# Patient Record
Sex: Female | Born: 1950 | Race: White | Hispanic: No | Smoking: Current every day smoker
Health system: Southern US, Community
[De-identification: ages and names within clinical notes are randomized; demographics above are authoritative.]

## PROBLEM LIST (undated history)

## (undated) DIAGNOSIS — I10 Essential (primary) hypertension: Secondary | ICD-10-CM

## (undated) DIAGNOSIS — M797 Fibromyalgia: Secondary | ICD-10-CM

## (undated) DIAGNOSIS — K219 Gastro-esophageal reflux disease without esophagitis: Secondary | ICD-10-CM

## (undated) DIAGNOSIS — F32A Depression, unspecified: Secondary | ICD-10-CM

## (undated) DIAGNOSIS — I2699 Other pulmonary embolism without acute cor pulmonale: Secondary | ICD-10-CM

## (undated) DIAGNOSIS — R51 Headache: Secondary | ICD-10-CM

## (undated) DIAGNOSIS — I2510 Atherosclerotic heart disease of native coronary artery without angina pectoris: Secondary | ICD-10-CM

## (undated) DIAGNOSIS — G8929 Other chronic pain: Secondary | ICD-10-CM

## (undated) DIAGNOSIS — R519 Headache, unspecified: Secondary | ICD-10-CM

## (undated) DIAGNOSIS — E785 Hyperlipidemia, unspecified: Secondary | ICD-10-CM

## (undated) DIAGNOSIS — K635 Polyp of colon: Secondary | ICD-10-CM

## (undated) DIAGNOSIS — J189 Pneumonia, unspecified organism: Secondary | ICD-10-CM

## (undated) DIAGNOSIS — B1920 Unspecified viral hepatitis C without hepatic coma: Secondary | ICD-10-CM

## (undated) DIAGNOSIS — F419 Anxiety disorder, unspecified: Secondary | ICD-10-CM

## (undated) DIAGNOSIS — J849 Interstitial pulmonary disease, unspecified: Secondary | ICD-10-CM

## (undated) DIAGNOSIS — F329 Major depressive disorder, single episode, unspecified: Secondary | ICD-10-CM

## (undated) DIAGNOSIS — Z853 Personal history of malignant neoplasm of breast: Secondary | ICD-10-CM

## (undated) DIAGNOSIS — K589 Irritable bowel syndrome without diarrhea: Secondary | ICD-10-CM

## (undated) DIAGNOSIS — C539 Malignant neoplasm of cervix uteri, unspecified: Secondary | ICD-10-CM

## (undated) DIAGNOSIS — R079 Chest pain, unspecified: Secondary | ICD-10-CM

## (undated) DIAGNOSIS — G459 Transient cerebral ischemic attack, unspecified: Secondary | ICD-10-CM

## (undated) DIAGNOSIS — M1990 Unspecified osteoarthritis, unspecified site: Secondary | ICD-10-CM

## (undated) DIAGNOSIS — Z9289 Personal history of other medical treatment: Secondary | ICD-10-CM

## (undated) DIAGNOSIS — J449 Chronic obstructive pulmonary disease, unspecified: Secondary | ICD-10-CM

## (undated) HISTORY — DX: Polyp of colon: K63.5

## (undated) HISTORY — DX: Malignant neoplasm of cervix uteri, unspecified: C53.9

## (undated) HISTORY — DX: Depression, unspecified: F32.A

## (undated) HISTORY — DX: Hyperlipidemia, unspecified: E78.5

## (undated) HISTORY — DX: Unspecified osteoarthritis, unspecified site: M19.90

## (undated) HISTORY — DX: Other pulmonary embolism without acute cor pulmonale: I26.99

## (undated) HISTORY — DX: Unspecified viral hepatitis C without hepatic coma: B19.20

## (undated) HISTORY — PX: PARTIAL HYSTERECTOMY: SHX80

## (undated) HISTORY — PX: BRAIN SURGERY: SHX531

## (undated) HISTORY — DX: Anxiety disorder, unspecified: F41.9

## (undated) HISTORY — PX: TOTAL ABDOMINAL HYSTERECTOMY: SHX209

## (undated) HISTORY — DX: Transient cerebral ischemic attack, unspecified: G45.9

## (undated) HISTORY — DX: Irritable bowel syndrome, unspecified: K58.9

## (undated) HISTORY — PX: BREAST SURGERY: SHX581

## (undated) HISTORY — PX: THROAT SURGERY: SHX803

## (undated) HISTORY — DX: Fibromyalgia: M79.7

## (undated) HISTORY — DX: Chronic obstructive pulmonary disease, unspecified: J44.9

## (undated) HISTORY — DX: Major depressive disorder, single episode, unspecified: F32.9

## (undated) HISTORY — DX: Pneumonia, unspecified organism: J18.9

## (undated) HISTORY — PX: CATARACT EXTRACTION W/ INTRAOCULAR LENS IMPLANT: SHX1309

---

## 2000-09-12 ENCOUNTER — Encounter: Admission: RE | Admit: 2000-09-12 | Discharge: 2000-09-12 | Payer: Self-pay | Admitting: Urology

## 2001-01-12 ENCOUNTER — Ambulatory Visit (HOSPITAL_COMMUNITY): Admission: RE | Admit: 2001-01-12 | Discharge: 2001-01-12 | Payer: Self-pay | Admitting: Family Medicine

## 2001-01-12 ENCOUNTER — Encounter: Payer: Self-pay | Admitting: Family Medicine

## 2001-08-23 DIAGNOSIS — Z9289 Personal history of other medical treatment: Secondary | ICD-10-CM

## 2001-08-23 HISTORY — DX: Personal history of other medical treatment: Z92.89

## 2001-11-08 ENCOUNTER — Encounter: Payer: Self-pay | Admitting: Family Medicine

## 2001-11-08 ENCOUNTER — Observation Stay (HOSPITAL_COMMUNITY): Admission: AD | Admit: 2001-11-08 | Discharge: 2001-11-10 | Payer: Self-pay | Admitting: Family Medicine

## 2001-11-10 ENCOUNTER — Encounter: Payer: Self-pay | Admitting: Family Medicine

## 2001-11-10 ENCOUNTER — Encounter: Payer: Self-pay | Admitting: Cardiology

## 2001-11-19 ENCOUNTER — Encounter: Payer: Self-pay | Admitting: Emergency Medicine

## 2001-11-19 ENCOUNTER — Emergency Department (HOSPITAL_COMMUNITY): Admission: EM | Admit: 2001-11-19 | Discharge: 2001-11-19 | Payer: Self-pay | Admitting: Emergency Medicine

## 2003-05-29 IMAGING — CT CT CHEST W/ CM
1 of 2 series · 16 of 31 positions shown, 20 images · IV contrast (CONTRAST)
Comparison: none

FINDINGS
CLINICAL DATA: SMOKER WITH CHEST PAIN AND SHORTNESS OF BREATH.
CT CHEST WITH CONTRAST MEDIA
COMPARISON 10/15/00.  A PULMONARY EMBOLISM PROTOCOL WAS REQUESTED AND PERFORMED WITH 150 CC
OMNIPAQUE 300 INTRAVENOUS CONTRAST.  THE PULMONARY ARTERIES ARE NORMALLY OPACIFIED.  NO PULMONARY
EMBOLI ARE SEEN.  MILD DIFFUSE ACCENTUATION OF THE INTERSTITIAL MARKINGS IS AGAIN DEMONSTRATED.  NO
AIR SPACE CONSOLIDATION IS SEEN.  THE REGIONAL BONES ARE UNREMARKABLE.  A 1.6 CM MIXED LOW AND
INTERMEDIATE DENSITY LEFT THYROID MASS IS UNCHANGED.
IMPRESSION
1.  NO PULMONARY EMBOLI.
2.  STABLE MILD CHRONIC INTERSTITIAL LUNG DISEASE COMPATIBLE WITH A HISTORY OF SMOKING.
3.  STABLE 1.6 CM LEFT LOBE THYROID MASS.

[Series 604: — · axial · 0.57mm/px · z∈[+1646,+1833]mm · 16 of 343 slices shown, 20 images]
[im 16/343  mediastinal]
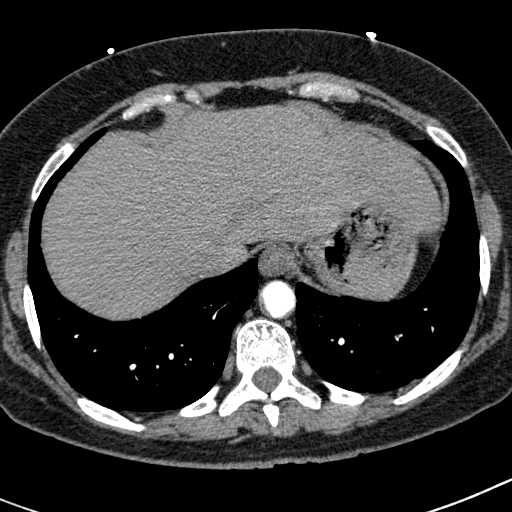
[im 16/343  lung]
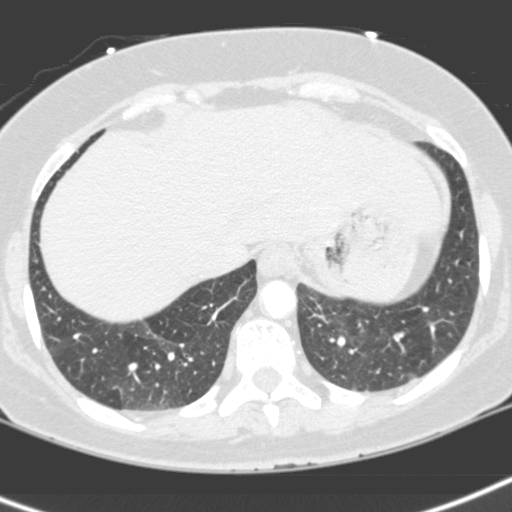
[im 47/343  lung]
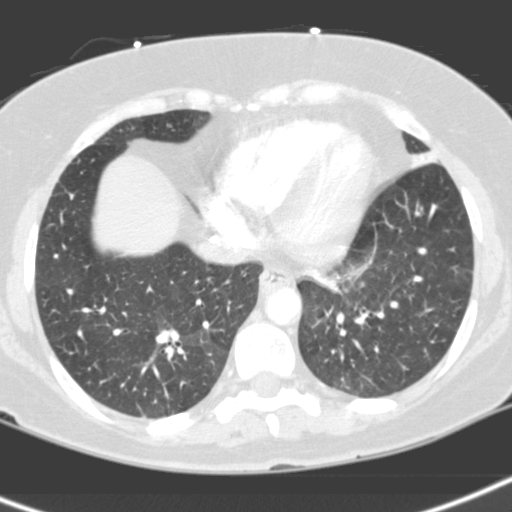
[im 63/343  lung]
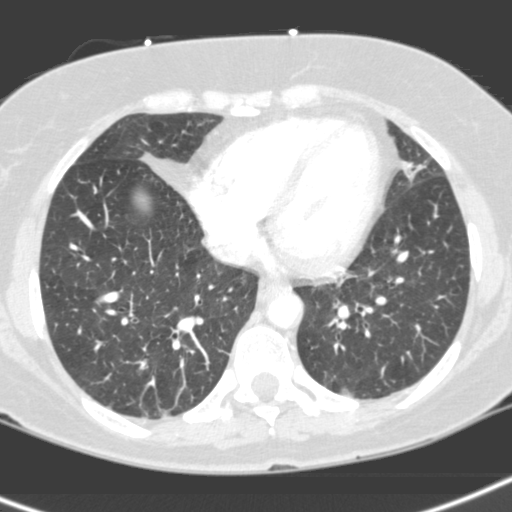
[im 86/343  lung]
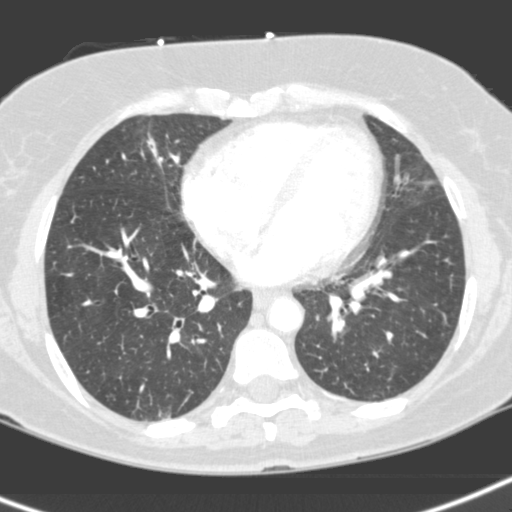
[im 94/343  mediastinal]
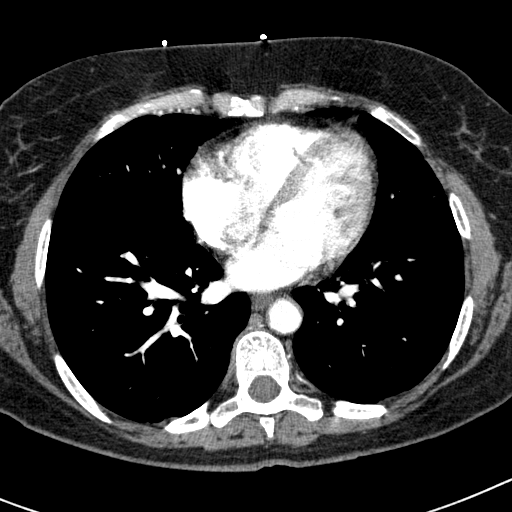
[im 94/343  lung]
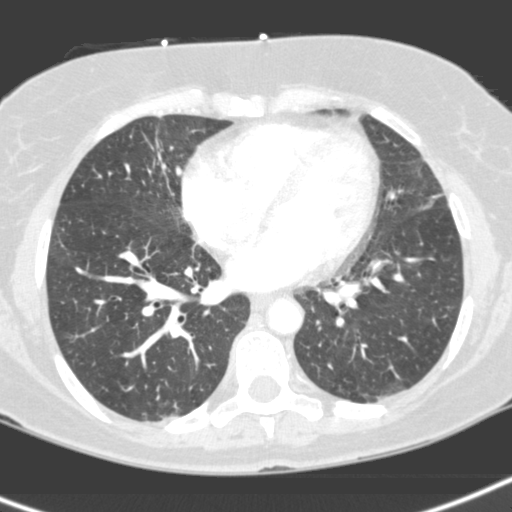
[im 109/343  lung]
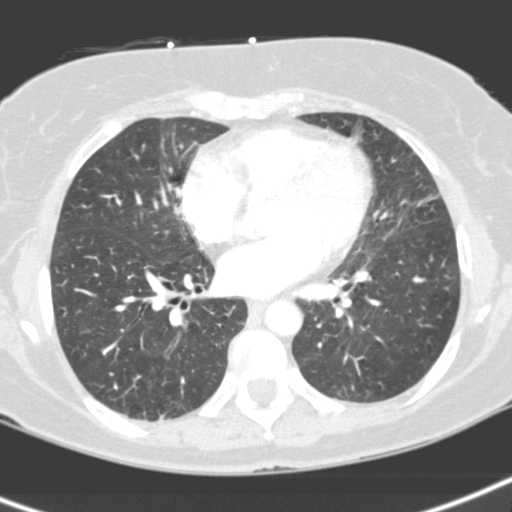
[im 140/343  lung]
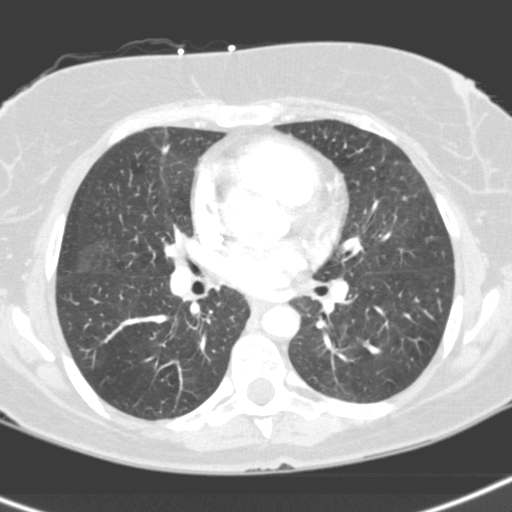
[im 156/343  lung]
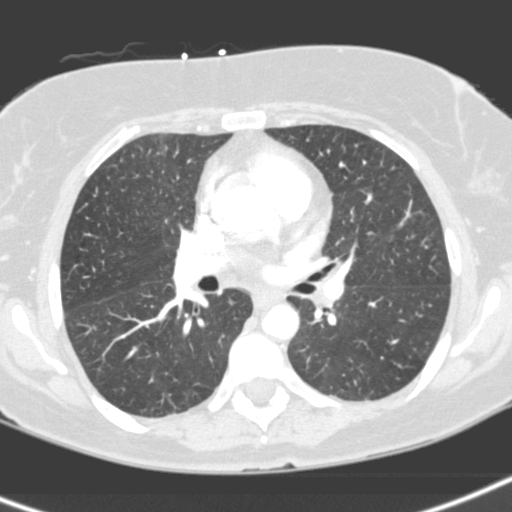
[im 187/343  mediastinal]
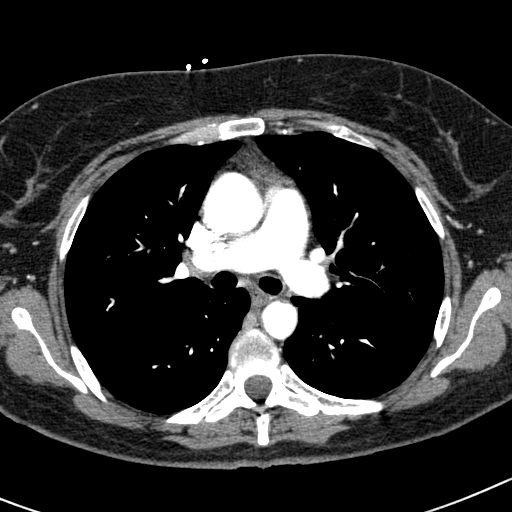
[im 187/343  lung]
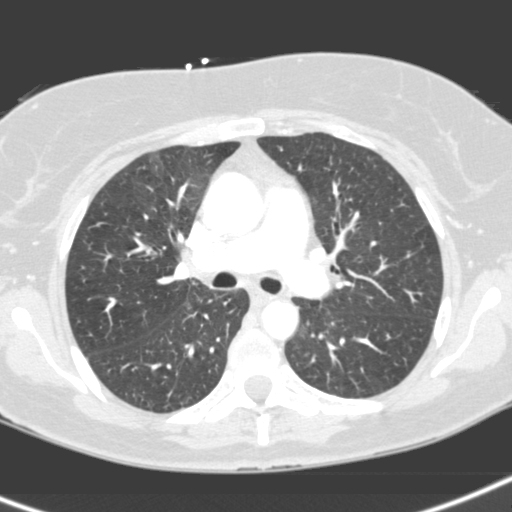
[im 203/343  lung]
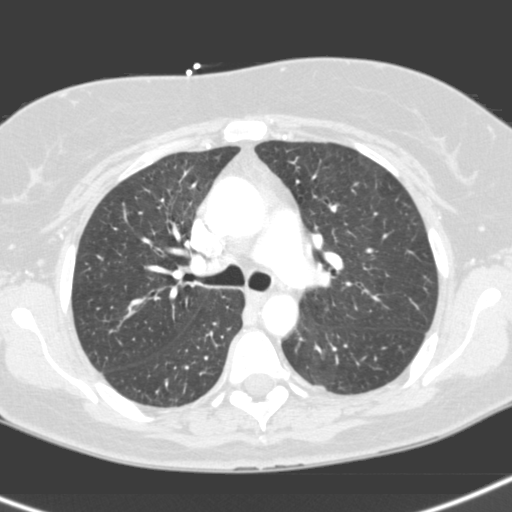
[im 234/343  lung]
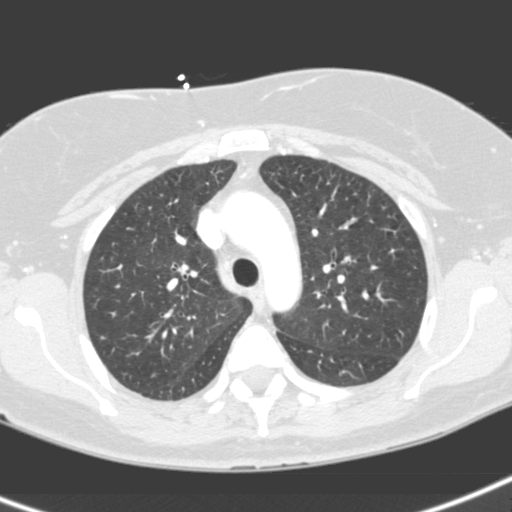
[im 249/343  lung]
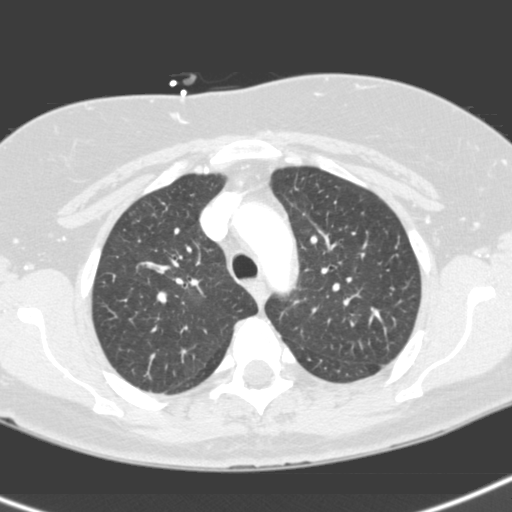
[im 257/343  mediastinal]
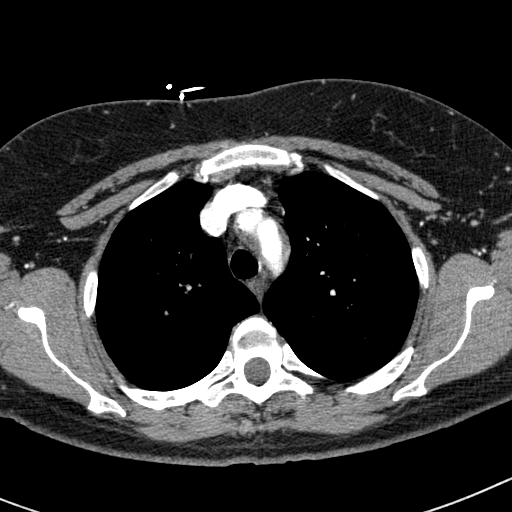
[im 257/343  lung]
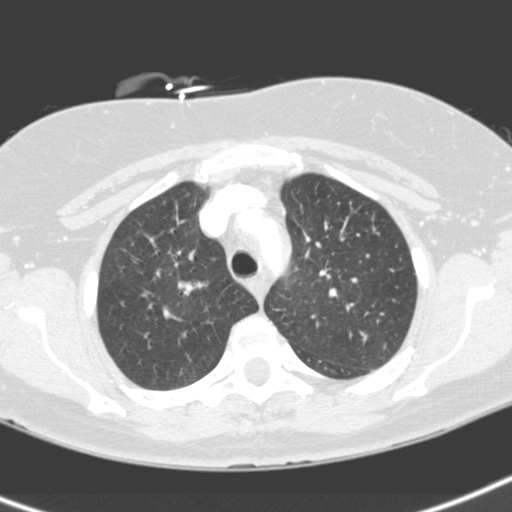
[im 280/343  lung]
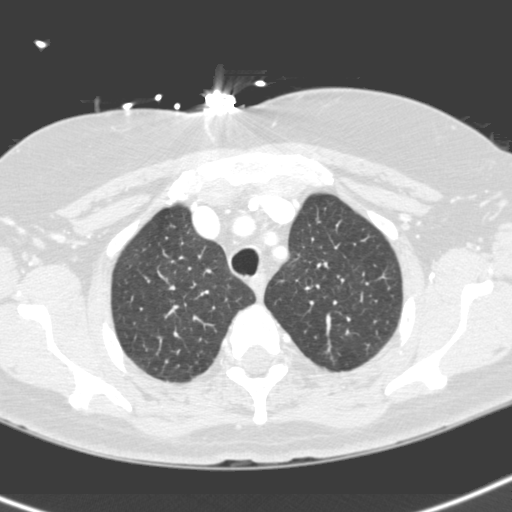
[im 296/343  lung]
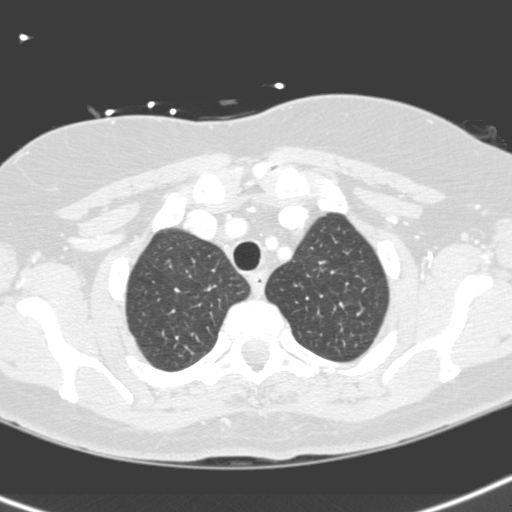
[im 327/343  lung]
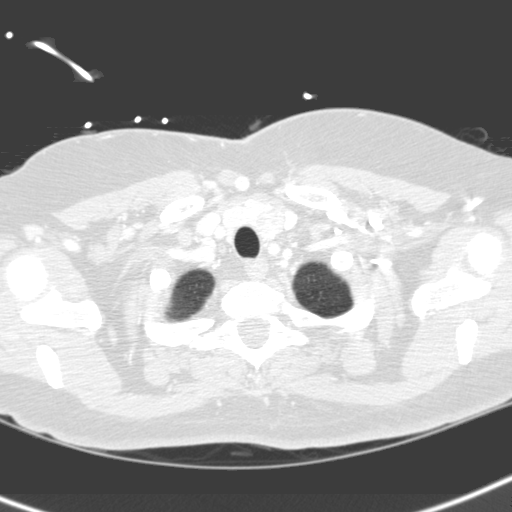

[16 of 31 positions shown; findings below may reference images not displayed]

## 2006-04-18 ENCOUNTER — Ambulatory Visit (HOSPITAL_COMMUNITY): Admission: RE | Admit: 2006-04-18 | Discharge: 2006-04-18 | Payer: Self-pay | Admitting: Family Medicine

## 2006-04-21 ENCOUNTER — Emergency Department (HOSPITAL_COMMUNITY): Admission: EM | Admit: 2006-04-21 | Discharge: 2006-04-22 | Payer: Self-pay | Admitting: Emergency Medicine

## 2008-05-13 ENCOUNTER — Ambulatory Visit (HOSPITAL_COMMUNITY): Admission: RE | Admit: 2008-05-13 | Discharge: 2008-05-13 | Payer: Self-pay | Admitting: Family Medicine

## 2011-01-08 NOTE — Discharge Summary (Signed)
[facility]  Patient:    Dawn Mckay, Dawn Mckay Visit Number: [id number] MRN: [id number]          Service Type: MED Location: 2A A206 01 Attending Physician:  Harlow Asa Dictated by:   Lilyan Punt, M.D. Admit Date:  11/08/2001 Discharge Date: 11/10/2001                             Discharge Summary  DISCHARGE DIAGNOSES: 1. Chest discomfort, presumed gastroesophageal reflux disease. 2. Myocardial infarction ruled out. 3. Pulmonary embolus ruled out.  HOSPITAL COURSE:  The patient is a 60 year old white female with a history of pulmonary embolus in 1992, presents with severe chest discomfort and pain and difficulty with breathing and pressure, slight nausea.  Underwent exercise Cardiolite test, and that did not show any ischemia.  Her echo showed a normal left and right atrial size, no valvular abnormalities.  Her spiral CT of the chest did not show any pulmonary embolus.  The patient was discharged to home.  MEDICATIONS: 1. Xanax 0.5 mg b.i.d. p.r.n. nerves.  Cautioned drowsiness. 2. Zantac 150 mg, 1 b.i.d.  FOLLOW-UP:  To follow up in the office for a complete physical somewhere in the course of the next month.  DISCHARGE INSTRUCTIONS:  To stay out of work until next Wednesday.  To call sooner if any problems. Dictated by:   Lilyan Punt, M.D. Attending Physician:  Harlow Asa DD:  11/10/01 TD:  11/12/01 Job: [id number]

## 2011-01-08 NOTE — Procedures (Signed)
[facility]  Patient:    DANA, DEBO Visit Number: [id number] MRN: [id number]          Service Type: MED Location: 2A A206 01 Attending Physician:  Harlow Asa Dictated by:   Vania Rea. Rinehuls, P.A. Proc. Date: 11/10/01 Admit Date:  11/08/2001 Discharge Date: 11/10/2001   CC:         Lilyan Punt, M.D.   Stress Test  DATE OF BIRTH:  1951/06/14.  PROCEDURE:  Exercise Cardiolite study.  CARDIOLOGIST:  Valera Castle, M.D.  INDICATIONS:  Ms. Olenick is a 60 year old female who was admitted to [facility] on November 08, 2001, for an evaluation of chest pain.  She has no previous cardiac history.  She does have some risk factors, including ongoing tobacco use, a history of recent syncope, a history of a pulmonary embolus in 1996.  She had a 2-D echocardiogram that is currently pending.  The patient was initially scheduled for an exercise Cardiolite.  DESCRIPTION OF PROCEDURE:  She was only able to exercise for 1 minute and 32 seconds.  She felt very weak and fatigued.  She was unable to go further.  She developed some mild left arm pain, but no significant chest pain.  On physical examination the patient had no significant wheezing at this time. She was changed to an adenosine Cardiolite study.  The adenosine was administered minutes 1 through 6.  Cardiolite was given at 3 minutes.  The patient felt "bad" during this study; however, she was unable to give any specific complaints.  There were no electrocardiographic changes during this study.  Her symptoms are resolving at this time.  The images are pending. Dictated by:   Vania Rea. Rinehuls, P.A. Attending Physician:  Harlow Asa DD:  11/10/01 TD:  11/12/01 Job: [id number]

## 2011-01-08 NOTE — Procedures (Signed)
[facility]  Patient:    Dawn Mckay, Dawn Mckay Visit Number: [id number] MRN: [id number]          Service Type: EMS Location: ED Attending Physician:  Herbert Seta Dictated by:   Lilyan Punt, M.D. Admit Date:  11/19/2001 Discharge Date: 11/19/2001                            EKG Interpretations  319  INTERPRETATION:  Normal sinus rhythm, no acute changes are noted.  ST segemtns are normal.  DIAGNOSIS:  Normal EKG. Dictated by:   Lilyan Punt, M.D. Attending Physician:  Herbert Seta DD:  11/24/01 TD:  11/25/01 Job: [id number]

## 2011-01-08 NOTE — Group Therapy Note (Signed)
[facility]  Patient:    Dawn Mckay, Dawn Mckay Visit Number: [id number] MRN: [id number]          Service Type: MED Location: 2A A206 01 Attending Physician:  Harlow Asa Dictated by:   Lilyan Punt, M.D. Proc. Date: 11/10/01 Admit Date:  11/08/2001 Discharge Date: 11/10/2001                               Progress Note  SUBJECTIVE:  The patient relates minimal discomforts, but no anginal discomfort.  Awaiting the stress portion today.  Overall doing well.  OBJECTIVE:  The lungs are clear.  Heart:  Regular pulse.  Blood pressure good.  ASSESSMENT/PLAN:  Chest pain, awaiting the stress portion with cardiologists interpretation. Dictated by:   Lilyan Punt, M.D. Attending Physician:  Harlow Asa DD:  11/10/01 TD:  11/12/01 Job: [id number]

## 2011-01-08 NOTE — Procedures (Signed)
[facility]  Patient:    Dawn Mckay, Dawn Mckay Visit Number: [id number] MRN: [id number]          Service Type: EMS Location: ED Attending Physician:  Herbert Seta Dictated by:   Lilyan Punt, M.D. Admit Date:  11/19/2001 Discharge Date: 11/19/2001                            EKG Interpretations  [id number]  INTERPRETATION:  Normal sinus rhythm, loss of Q wave in V3 otherwise normal. Dictated by:   Lilyan Punt, M.D. Attending Physician:  Herbert Seta DD:  11/24/01 TD:  11/25/01 Job: [id number]

## 2011-01-08 NOTE — H&P (Signed)
[facility]  Patient:    Dawn Mckay, Dawn Mckay Visit Number: [id number] MRN: [id number]          Service Type: EMS Location: ED Attending Physician:  Herbert Seta Dictated by:   Donna Bernard, M.D. Admit Date:  11/19/2001 Discharge Date: 11/19/2001                           History and Physical  CHIEF COMPLAINT:  Chest pain.  HISTORY OF PRESENT ILLNESS:  This patient is a 60 year old white female with a history of remote pulmonary embolus and chronic smoking, who presented to the office on the day of admission with complaints of chest pain.  The patient states that she has been experiencing chest discomfort, usually while she is at work.  She describes the chest pain as a deep substernal pressure which radiates to the back.  It is accompanied at times by shortness of breath. There is no significant nausea, though at times the patient does experience diaphoresis.  She has been experiencing these symptoms off and on for some time but worse in the past week.  The patient notes no significant abdominal pain, although she does have reflux-type symptoms.  Also, on further history the pain is minimally associated with change in position or use of arms.  The patient has tried over-the-counter medicines p.r.n.  She states when the pain comes on she rests for a few minutes, then it fades.  Recently she has been taking aspirin on a regular basis in hopes of managing any type of cardiac pain.  CURRENT MEDICATIONS: 1. Aspirin p.r.n. 2. Xanax 0.5 mg b.i.d. p.r.n.  PAST MEDICAL HISTORY: 1. Depression. 2. Remote pulmonary embolus in 1992.  PAST SURGICAL HISTORY: 1. Hysterectomy in 1982. 2. Bladder operation in 2002.  FAMILY HISTORY:  Positive for hypertension, diabetes, and coronary artery disease in a maternal grandmother.  SOCIAL HISTORY:  The patient smokes one-half pack per day.  She claims no significant alcohol use.  PHYSICAL  EXAMINATION:  VITAL SIGNS:  Blood pressure 150/90.  GENERAL:  Alert, in some apparent distress.  HEENT:  Normal.  NECK:  Supple.  No lymphadenopathy, no JVD.  LUNGS:  BILATERAL mild wheezes.  No crackles.  CHEST:  Anterior chest wall tender to palpation along the sternum.  Epigastric region, some tenderness evident to deep palpation.  No rebound, no guarding, no masses.  EXTREMITIES:  Normal.  LABORATORY DATA:  EKG:  Normal sinus rhythm with no significant ST-T changes.  Laboratories pending.  IMPRESSION: 1. Chest pain with some features suggestive of unstable angina.  With multiple    risk factors and nature of the pain, we need to assume the possibility of    this and treat accordingly.  Exam also supported an element of chest wall    inflammation along with an element of gastritis.  The gastritis may well be    induced by significant recent aspirin usage in response to chest    discomfort. 2. Chronic smoker. 3. Depression.  PLAN:  Admit with unstable angina diagnosis.  Further orders as noted in the chart. Dictated by:   Donna Bernard, M.D. Attending Physician:  Herbert Seta DD:  11/08/01 TD:  11/09/01 Job: [id number]

## 2013-02-21 ENCOUNTER — Other Ambulatory Visit (HOSPITAL_COMMUNITY): Payer: Self-pay | Admitting: Nurse Practitioner

## 2013-02-21 ENCOUNTER — Other Ambulatory Visit (HOSPITAL_COMMUNITY): Payer: Self-pay | Admitting: *Deleted

## 2013-02-21 DIAGNOSIS — IMO0002 Reserved for concepts with insufficient information to code with codable children: Secondary | ICD-10-CM

## 2013-02-28 ENCOUNTER — Encounter (HOSPITAL_COMMUNITY): Payer: Self-pay

## 2013-03-07 ENCOUNTER — Ambulatory Visit (HOSPITAL_COMMUNITY)
Admission: RE | Admit: 2013-03-07 | Discharge: 2013-03-07 | Disposition: A | Discharge status: Home / Self Care | Payer: PRIVATE HEALTH INSURANCE | Source: Ambulatory Visit | Attending: *Deleted | Admitting: *Deleted

## 2013-03-07 DIAGNOSIS — IMO0002 Reserved for concepts with insufficient information to code with codable children: Secondary | ICD-10-CM

## 2013-03-07 DIAGNOSIS — N644 Mastodynia: Secondary | ICD-10-CM | POA: Insufficient documentation

## 2013-08-04 ENCOUNTER — Observation Stay (HOSPITAL_COMMUNITY): Payer: Self-pay

## 2013-08-04 ENCOUNTER — Emergency Department (HOSPITAL_COMMUNITY): Payer: Self-pay

## 2013-08-04 ENCOUNTER — Encounter (HOSPITAL_COMMUNITY): Payer: Self-pay | Admitting: Emergency Medicine

## 2013-08-04 ENCOUNTER — Other Ambulatory Visit: Payer: Self-pay

## 2013-08-04 ENCOUNTER — Observation Stay (HOSPITAL_COMMUNITY)
Admission: EM | Admit: 2013-08-04 | Discharge: 2013-08-04 | Disposition: A | Discharge status: Home / Self Care | Payer: Self-pay | Attending: Internal Medicine | Admitting: Internal Medicine

## 2013-08-04 DIAGNOSIS — I10 Essential (primary) hypertension: Secondary | ICD-10-CM | POA: Insufficient documentation

## 2013-08-04 DIAGNOSIS — I2510 Atherosclerotic heart disease of native coronary artery without angina pectoris: Secondary | ICD-10-CM

## 2013-08-04 DIAGNOSIS — R110 Nausea: Secondary | ICD-10-CM | POA: Insufficient documentation

## 2013-08-04 DIAGNOSIS — F17200 Nicotine dependence, unspecified, uncomplicated: Secondary | ICD-10-CM

## 2013-08-04 DIAGNOSIS — N644 Mastodynia: Secondary | ICD-10-CM | POA: Diagnosis present

## 2013-08-04 DIAGNOSIS — E871 Hypo-osmolality and hyponatremia: Secondary | ICD-10-CM

## 2013-08-04 DIAGNOSIS — R079 Chest pain, unspecified: Secondary | ICD-10-CM

## 2013-08-04 DIAGNOSIS — Z853 Personal history of malignant neoplasm of breast: Secondary | ICD-10-CM | POA: Insufficient documentation

## 2013-08-04 DIAGNOSIS — E876 Hypokalemia: Secondary | ICD-10-CM

## 2013-08-04 DIAGNOSIS — Z86718 Personal history of other venous thrombosis and embolism: Secondary | ICD-10-CM

## 2013-08-04 DIAGNOSIS — R072 Precordial pain: Principal | ICD-10-CM | POA: Insufficient documentation

## 2013-08-04 HISTORY — DX: Atherosclerotic heart disease of native coronary artery without angina pectoris: I25.10

## 2013-08-04 HISTORY — DX: Personal history of malignant neoplasm of breast: Z85.3

## 2013-08-04 HISTORY — DX: Essential (primary) hypertension: I10

## 2013-08-04 LAB — COMPREHENSIVE METABOLIC PANEL
ALT: 18 U/L (ref 0–35)
AST: 26 U/L (ref 0–37)
Albumin: 3.8 g/dL (ref 3.5–5.2)
Alkaline Phosphatase: 64 U/L (ref 39–117)
Calcium: 9 mg/dL (ref 8.4–10.5)
Potassium: 3.2 mEq/L — ABNORMAL LOW (ref 3.5–5.1)
Sodium: 130 mEq/L — ABNORMAL LOW (ref 135–145)
Total Protein: 6.7 g/dL (ref 6.0–8.3)

## 2013-08-04 LAB — BASIC METABOLIC PANEL
BUN: 4 mg/dL — ABNORMAL LOW (ref 6–23)
CO2: 22 mEq/L (ref 19–32)
Calcium: 9.1 mg/dL (ref 8.4–10.5)
Chloride: 97 mEq/L (ref 96–112)
Creatinine, Ser: 0.66 mg/dL (ref 0.50–1.10)
GFR calc Af Amer: >90 mL/min (ref >90)
Potassium: 3.5 mEq/L (ref 3.5–5.1)

## 2013-08-04 LAB — TROPONIN I
Troponin I: <0.3 ng/mL (ref <0.30)
Troponin I: <0.3 ng/mL (ref <0.30)

## 2013-08-04 LAB — CBC
HCT: 38.6 % (ref 36.0–46.0)
Hemoglobin: 13.5 g/dL (ref 12.0–15.0)
MCH: 31.7 pg (ref 26.0–34.0)
MCH: 30.8 pg (ref 26.0–34.0)
MCHC: 36.3 g/dL — ABNORMAL HIGH (ref 30.0–36.0)
MCHC: 35 g/dL (ref 30.0–36.0)
MCV: 88.1 fL (ref 78.0–100.0)
Platelets: 170 10*3/uL (ref 150–400)
RDW: 13.6 % (ref 11.5–15.5)

## 2013-08-04 LAB — POCT I-STAT TROPONIN I

## 2013-08-04 MED ORDER — REGADENOSON 0.4 MG/5ML IV SOLN
INTRAVENOUS | Status: AC
  Administered 2013-08-04: 0.4 mg via INTRAVENOUS
  Filled 2013-08-04: qty 5

## 2013-08-04 MED ORDER — SODIUM CHLORIDE 0.9 % IV SOLN
INTRAVENOUS | Status: DC
  Administered 2013-08-04: 09:00:00 via INTRAVENOUS

## 2013-08-04 MED ORDER — TECHNETIUM TC 99M SESTAMIBI - CARDIOLITE
30.0000 | Freq: Once | INTRAVENOUS | Status: AC | PRN
  Administered 2013-08-04: 11:00:00 30 via INTRAVENOUS

## 2013-08-04 MED ORDER — NICOTINE 7 MG/24HR TD PT24
7.0000 mg | MEDICATED_PATCH | TRANSDERMAL | Status: DC
  Filled 2013-08-04 (×2): qty 1

## 2013-08-04 MED ORDER — ALUM & MAG HYDROXIDE-SIMETH 200-200-20 MG/5ML PO SUSP: 30.0000 mL | Freq: Four times a day (QID) | ORAL | Status: DC | PRN

## 2013-08-04 MED ORDER — TECHNETIUM TC 99M SESTAMIBI GENERIC - CARDIOLITE
10.0000 | Freq: Once | INTRAVENOUS | Status: AC | PRN
  Administered 2013-08-04: 10 via INTRAVENOUS

## 2013-08-04 MED ORDER — REGADENOSON 0.4 MG/5ML IV SOLN
0.4000 mg | Freq: Once | INTRAVENOUS | Status: AC
  Administered 2013-08-04: 0.4 mg via INTRAVENOUS
  Filled 2013-08-04: qty 5

## 2013-08-04 MED ORDER — HYDROMORPHONE HCL PF 1 MG/ML IJ SOLN
0.5000 mg | INTRAMUSCULAR | Status: DC | PRN
  Administered 2013-08-04: 0.5 mg via INTRAVENOUS
  Filled 2013-08-04: qty 1

## 2013-08-04 MED ORDER — ZOLPIDEM TARTRATE 5 MG PO TABS: 5.0000 mg | ORAL_TABLET | Freq: Every evening | ORAL | Status: DC | PRN

## 2013-08-04 MED ORDER — POTASSIUM CHLORIDE CRYS ER 20 MEQ PO TBCR
40.0000 meq | EXTENDED_RELEASE_TABLET | Freq: Once | ORAL | Status: AC
  Administered 2013-08-04: 40 meq via ORAL
  Filled 2013-08-04: qty 2

## 2013-08-04 MED ORDER — SODIUM CHLORIDE 0.9 % IV SOLN: INTRAVENOUS | Status: DC

## 2013-08-04 MED ORDER — HYDRALAZINE HCL 20 MG/ML IJ SOLN: 10.0000 mg | Freq: Four times a day (QID) | INTRAMUSCULAR | Status: DC | PRN

## 2013-08-04 MED ORDER — IOHEXOL 350 MG/ML SOLN
80.0000 mL | Freq: Once | INTRAVENOUS | Status: AC | PRN
  Administered 2013-08-04: 80 mL via INTRAVENOUS

## 2013-08-04 MED ORDER — ACETAMINOPHEN 650 MG RE SUPP: 650.0000 mg | Freq: Four times a day (QID) | RECTAL | Status: DC | PRN

## 2013-08-04 MED ORDER — NITROGLYCERIN 0.4 MG SL SUBL: 0.4000 mg | SUBLINGUAL_TABLET | SUBLINGUAL | Status: DC | PRN

## 2013-08-04 MED ORDER — ACETAMINOPHEN 325 MG PO TABS
650.0000 mg | ORAL_TABLET | Freq: Four times a day (QID) | ORAL | Status: DC | PRN
  Administered 2013-08-04: 650 mg via ORAL
  Filled 2013-08-04: qty 2

## 2013-08-04 MED ORDER — OXYCODONE HCL 5 MG PO TABS
5.0000 mg | ORAL_TABLET | ORAL | Status: DC | PRN
  Administered 2013-08-04: 5 mg via ORAL
  Filled 2013-08-04: qty 1

## 2013-08-04 MED ORDER — PANTOPRAZOLE SODIUM 40 MG PO TBEC
40.0000 mg | DELAYED_RELEASE_TABLET | Freq: Every day | ORAL | Status: DC
Start: 2013-08-04 — End: 2013-08-04

## 2013-08-04 MED ORDER — ONDANSETRON HCL 4 MG PO TABS: 4.0000 mg | ORAL_TABLET | Freq: Four times a day (QID) | ORAL | Status: DC | PRN

## 2013-08-04 MED ORDER — ENOXAPARIN SODIUM 40 MG/0.4ML ~~LOC~~ SOLN
40.0000 mg | SUBCUTANEOUS | Status: DC
  Filled 2013-08-04: qty 0.4

## 2013-08-04 MED ORDER — NITROGLYCERIN 2 % TD OINT: 0.5000 [in_us] | TOPICAL_OINTMENT | Freq: Four times a day (QID) | TRANSDERMAL | Status: DC

## 2013-08-04 MED ORDER — ONDANSETRON HCL 4 MG/2ML IJ SOLN: 4.0000 mg | Freq: Four times a day (QID) | INTRAMUSCULAR | Status: DC | PRN

## 2013-08-04 MED ORDER — ASPIRIN EC 325 MG PO TBEC: 325.0000 mg | DELAYED_RELEASE_TABLET | Freq: Every day | ORAL | Status: DC

## 2013-08-04 NOTE — Progress Notes (Signed)
[facility] PROGRESS NOTE  Dawn Mckay ZOX:[id number] DOB: August 03, 1951 DOA: 08/04/2013 PCP: No PCP Per Patient  Brief history 62 year old female with history of hypertension, tobacco use, and breast cancer presents with  Substernal chest discomfort that began around 9 PM on 08/03/2013. 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 It was associated with some nausea and vomiting x1 episode as well as diaphoresis. She checked her blood pressure at home and it registered "237/100". She took 2 doses of HCTZ (50 mg total), but she states that the blood pressure did not improve. As a result EMS was activated.  EMS gave NTG and ASA and her symptoms improved.  The patient states that she has had chest discomfort several times per week, but never has been associated with any shortness breath, nausea, or diaphoresis. Chest discomfort usually occurs at rest. The patient denies any dizziness or syncope. Currently, she denies any chest discomfort or shortness of breath the patient continues to smoke one half pack per day. She has smoked up to 2 packs per day x40 years. There is no family history of premature heart disease. Initial EKG was sinus rhythm without ST changes. Troponin in ED was negative. Metabolic panel showed sodium 130, potassium 3.2, serum creatinine 0.62. Hepatic panel and CBC were unremarkable. Assessment/Plan: Atypical chest pain -Finish cycling troponins -LexiScan stress test--I discussed with Dr. Johney Frame -EKG is reassuring -Continue aspirin -Check lipids Hypertension -Hold HCTZ for now due to hyponatremia and hypokalemia -May start lisinopril pending repeat BMP Tobacco abuse -Tobacco cessation discussed -Nicoderm patch Hyponatremia -judicious IVF -likely due to HCTZ Hypokalemia -replete -check mag   Family Communication:   son at beside Disposition Plan:   Home when medically stable        Procedures/Studies: Dg Chest 2  View  08/04/2013   CLINICAL DATA:  Chest pain  EXAM: CHEST  2 VIEW  COMPARISON:  Prior radiograph from 04/22/2006  FINDINGS: The cardiac and mediastinal silhouettes are stable in size and contour, and remain within normal limits.  Lungs are normally inflated. There is mild diffuse prominence of the mid interstitial markings, likely related to underlying bronchitic changes from history smoking. No focal infiltrate, pulmonary edema, or pleural effusion identified. No pneumothorax.  Osseous structures are within normal limits.  IMPRESSION: Diffuse bronchitic changes, likely related to a history of smoking. No acute cardiopulmonary process.   Electronically Signed   By: Rise Mu M.D.   On: 08/04/2013 03:06         Subjective: Patient currently denies fevers, chills, chest discomfort, shortness breath, nausea, vomiting, diarrhea, abdominal pain, dysuria, hematuria.  Objective: Filed Vitals:   08/04/13 0430 08/04/13 0445 08/04/13 0458 08/04/13 0514  BP: 109/62 113/62  139/59  Pulse: 72 66  64  Temp:    97.9 F (36.6 C)  TempSrc:    Oral  Resp: 11 27  18   Height:    5\' 3"  (1.6 m)  Weight:    80.105 kg (176 lb 9.6 oz)  SpO2: 93% 95% 95% 99%    Intake/Output Summary (Last 24 hours) at 08/04/13 8119 Last data filed at 08/04/13 0459  Gross per 24 hour  Intake    250 ml  Output      0 ml  Net    250 ml   Weight change:  Exam:   General:  Pt is alert, follows commands appropriately, not in acute distress  HEENT: No icterus, No thrush,  [place]/AT  Cardiovascular: RRR, S1/S2,  no rubs, no gallops  Respiratory: CTA bilaterally, no wheezing, no crackles, no rhonchi  Abdomen: Soft/+BS, non tender, non distended, no guarding  Extremities: no edema, No lymphangitis, No petechiae, No rashes, no synovitis  Data Reviewed: Basic Metabolic Panel:  Recent Labs Lab 08/04/13 0137  NA 130*  K 3.2*  CL 95*  CO2 23  GLUCOSE 115*  BUN 5*  CREATININE 0.62  CALCIUM 9.0   Liver  Function Tests:  Recent Labs Lab 08/04/13 0137  AST 26  ALT 18  ALKPHOS 64  BILITOT 0.3  PROT 6.7  ALBUMIN 3.8   No results found for this basename: LIPASE, AMYLASE,  in the last 168 hours No results found for this basename: AMMONIA,  in the last 168 hours CBC:  Recent Labs Lab 08/04/13 0137  WBC 6.4  HGB 13.2  HCT 36.4  MCV 87.3  PLT 170   Cardiac Enzymes:  Recent Labs Lab 08/04/13 0435  TROPONINI <0.30   BNP: No components found with this basename: POCBNP,  CBG: No results found for this basename: GLUCAP,  in the last 168 hours  No results found for this or any previous visit (from the past 240 hour(s)).   Scheduled Meds: . aspirin EC  325 mg Oral Daily  . enoxaparin (LOVENOX) injection  40 mg Subcutaneous Q24H  . nicotine  7 mg Transdermal Q24H  . nitroGLYCERIN  0.5 inch Topical Q6H  . pantoprazole  40 mg Oral Daily  . regadenoson  0.4 mg Intravenous Once   Continuous Infusions: . sodium chloride    . sodium chloride 75 mL/hr at 08/04/13 0912     Selig Wampole, DO  [facility] Pager [phone]  If 7PM-7AM, please contact night-coverage [email or web address] Password [email or web address] 08/04/2013, 9:22 AM   LOS: 0 days

## 2013-08-04 NOTE — Discharge Summary (Signed)
Physician Discharge Summary  Dawn Mckay LKG:[id number] DOB: Jul 15, 1951 DOA: 08/04/2013  PCP: No PCP Per Patient  Admit date: 08/04/2013 Discharge date: 08/04/2013  Recommendations for Outpatient Follow-up:  1. Pt will need to follow up with PCP in 2 weeks post discharge 2. Please obtain BMP to evaluate electrolytes and kidney function 3. Please also check CBC to evaluate Hg and Hct levels   Discharge Diagnoses:  Principal Problem:   Chest pain Active Problems:   Hypertension   Hyponatremia   Hypokalemia   CAD (coronary artery disease)   Tobacco use disorder Atypical chest pain  -Troponin neg x 2, iSTAT neg x 1 -LexiScan stress test--I discussed with Dr. Johney Frame who okayed study -LexiScan was negative for reversible ischemia -CT angiogram of chest negative for PE -EKG is reassuring  -Continue aspirin  Hypertension  -Hold HCTZ for now due to hyponatremia and hypokalemia  -BP has been labile -d/c HCTZ for now -instructed patient to follow up with PCP for BP check and consider switch to different anti-HTN if necessary Tobacco abuse  -Tobacco cessation discussed  -Nicoderm patch  Hyponatremia  -judicious IVF  -likely due to HCTZ  -improved with IVF Hypokalemia  -repleted  -check mag--1.9   Discharge Condition: stable  Disposition: home  Diet:heart healthy Wt Readings from Last 3 Encounters:  08/04/13 80.105 kg (176 lb 9.6 oz)    Brief history  62 year old female with history of hypertension, tobacco use, and breast cancer presents with Substernal chest discomfort that began around 9 PM on 08/03/2013. 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 It was associated with some nausea and vomiting x1 episode as well as diaphoresis. She checked her blood pressure at home and it registered "237/100". She took 2 doses of HCTZ (50 mg total), but she states that the blood pressure did not improve. As a result EMS was activated. EMS  gave NTG and ASA and her symptoms improved. The patient states that she has had chest discomfort several times per week, but never has been associated with any shortness breath, nausea, or diaphoresis. Chest discomfort usually occurs at rest. The patient denies any dizziness or syncope. Currently, she denies any chest discomfort or shortness of breath the patient continues to smoke one half pack per day. She has smoked up to 2 packs per day x40 years. There is no family history of premature heart disease. Initial EKG was sinus rhythm without ST changes. Troponin in ED was negative. Metabolic panel showed sodium 130, potassium 3.2, serum creatinine 0.62. Hepatic panel and CBC were unremarkable. Because of the patient's previous history of DVT in breast cancer, CT angiogram of the chest was obtained. It was negative for pulmonary embolus. LexiScan stress test was obtained and was negative for reversible ischemia. The patient's blood pressure remained quite labile during the hospitalization did not near the levels that the patient stated prior to admission. The patient was instructed to stay off of her HCTZ for now and follow up with her primary care provider for blood pressure check before restarting any new hypertensive medications.     Discharge Exam: Filed Vitals:   08/04/13 1435  BP: 114/55  Pulse: 70  Temp: 98.1 F (36.7 C)  Resp: 20   Filed Vitals:   08/04/13 1031 08/04/13 1033 08/04/13 1035 08/04/13 1435  BP: 137/80 158/76 145/73 114/55  Pulse: 82 111 97 70  Temp:    98.1 F (36.7 C)  TempSrc:    Oral  Resp:  20  Height:      Weight:      SpO2:    97%   General: A&O x 3, NAD, pleasant, cooperative Cardiovascular: RRR, no rub, no gallop, no S3 Respiratory: CTAB, no wheeze, no rhonchi Abdomen:soft, nontender, nondistended, positive bowel sounds Extremities: No edema, No lymphangitis, no petechiae  Discharge Instructions      Discharge Orders   Future Orders Complete By  Expires   Diet - low sodium heart healthy  As directed    Discharge instructions  As directed    Comments:     Stop taking HCTZ for now and follow up with your primary care doctor or health department for BP check.  You can also go to the [facility] on [facility]   Increase activity slowly  As directed        Medication List    STOP taking these medications       hydrochlorothiazide 25 MG tablet  Commonly known as:  HYDRODIURIL      TAKE these medications       HYDROcodone-acetaminophen 10-500 MG per tablet  Commonly known as:  LORTAB  Take 1 tablet by mouth every 6 (six) hours as needed (for migraine headaches).     nitroGLYCERIN 0.4 MG SL tablet  Commonly known as:  NITROSTAT  Place 0.4 mg under the tongue every 5 (five) minutes as needed for chest pain.         The results of significant diagnostics from this hospitalization (including imaging, microbiology, ancillary and laboratory) are listed below for reference.    Significant Diagnostic Studies: Dg Chest 2 View  08/04/2013   CLINICAL DATA:  Chest pain  EXAM: CHEST  2 VIEW  COMPARISON:  Prior radiograph from 04/22/2006  FINDINGS: The cardiac and mediastinal silhouettes are stable in size and contour, and remain within normal limits.  Lungs are normally inflated. There is mild diffuse prominence of the mid interstitial markings, likely related to underlying bronchitic changes from history smoking. No focal infiltrate, pulmonary edema, or pleural effusion identified. No pneumothorax.  Osseous structures are within normal limits.  IMPRESSION: Diffuse bronchitic changes, likely related to a history of smoking. No acute cardiopulmonary process.   Electronically Signed   By: Rise Mu M.D.   On: 08/04/2013 03:06   Ct Angio Chest Pe W/cm &/or Wo Cm  08/04/2013   CLINICAL DATA:  Substernal chest discomfort, nausea/vomiting, history of breast cancer, prior DVT  EXAM: CT ANGIOGRAPHY CHEST  WITH CONTRAST  TECHNIQUE: Multidetector CT imaging of the chest was performed using the standard protocol during bolus administration of intravenous contrast. Multiplanar CT image reconstructions including MIPs were obtained to evaluate the vascular anatomy.  CONTRAST:  80mL OMNIPAQUE IOHEXOL 350 MG/ML SOLN  COMPARISON:  Chest radiographs dated 08/04/2013. CTA chest dated 11/10/2001.  FINDINGS: No evidence of pulmonary embolism.  Mild scarring in the lingula/right middle lobe. Mild dependent atelectasis in the bilateral lower lobes. Mild mosaic attenuation. No pleural effusion or pneumothorax.  Visualized right thyroid is unremarkable.  The heart is top-normal in size. Mild coronary atherosclerosis in the LAD (series 5/ image 36). Atherosclerotic calcifications of the aortic arch.  No suspicious mediastinal, hilar, or axillary lymphadenopathy.  Visualized upper abdomen is unremarkable.  Very mild degenerative changes of the thoracic spine. No focal osseous lesion is seen.  Review of the MIP images confirms the above findings.  IMPRESSION: No evidence of pulmonary embolism.  No evidence of acute cardiopulmonary disease.   Electronically  Signed   By: Charline Bills M.D.   On: 08/04/2013 16:06   Nm Myocar Multi W/spect W/wall Motion / Ef  08/04/2013   CLINICAL DATA:  62 year old female smoker with current history of hypertension, presenting with chest pain.  EXAM: MYOCARDIAL IMAGING WITH SPECT (REST AND PHARMACOLOGIC-STRESS)  GATED LEFT VENTRICULAR WALL MOTION STUDY  LEFT VENTRICULAR EJECTION FRACTION  TECHNIQUE: Standard myocardial SPECT imaging was performed after resting intravenous injection of 10 mCi Tc-23m sestamibi. Subsequently, intravenous infusion of Lexiscan was performed under the supervision of the Cardiology staff. At peak effect of the drug, 30 mCi Tc-14m sestamibi was injected intravenously and standard myocardial SPECT imaging was performed. Quantitative gated imaging was also performed to  evaluate left ventricular wall motion, and estimate left ventricular ejection fraction.  COMPARISON:  No prior imaging. Report of adenosine myocardial profusion imaging study dated 11/10/2001 which was interpreted as normal.  FINDINGS: Immediate post regadenoson images demonstrate no significant focal perfusion defects. Initial resting images demonstrate similar findings. No evidence of reversibility to suggest myocardial ischemia. Findings are confirmed by the computer generated polar map. There is significant subdiaphragmatic activity on both the resting and post regadenoson images.  Gated images demonstrate satisfactory thickening throughout the left ventricular myocardium with normal wall motion throughout.  Estimated QGS ejection fraction measured 84%, likely overestimated, with an end-diastolic volume of 58 ml and an end systolic volume of 9 ml.  IMPRESSION: 1. Normal myocardial profusion imaging. 2. Normal left ventricular wall motion. 3. Estimated QGS ejection fraction likely overestimated at 84%. This can be an indication of left ventricular hypertrophy.   Electronically Signed   By: Hulan Saas M.D.   On: 08/04/2013 12:19     Microbiology: No results found for this or any previous visit (from the past 240 hour(s)).   Labs: Basic Metabolic Panel:  Recent Labs Lab 08/04/13 0137 08/04/13 0845 08/04/13 0909  NA 130* 135  --   K 3.2* 3.5  --   CL 95* 97  --   CO2 23 22  --   GLUCOSE 115* 101*  --   BUN 5* 4*  --   CREATININE 0.62 0.66  --   CALCIUM 9.0 9.1  --   MG  --   --  1.9   Liver Function Tests:  Recent Labs Lab 08/04/13 0137  AST 26  ALT 18  ALKPHOS 64  BILITOT 0.3  PROT 6.7  ALBUMIN 3.8   No results found for this basename: LIPASE, AMYLASE,  in the last 168 hours No results found for this basename: AMMONIA,  in the last 168 hours CBC:  Recent Labs Lab 08/04/13 0137 08/04/13 0845  WBC 6.4 5.9  HGB 13.2 13.5  HCT 36.4 38.6  MCV 87.3 88.1  PLT 170 198    Cardiac Enzymes:  Recent Labs Lab 08/04/13 0435 08/04/13 0909  TROPONINI <0.30 <0.30   BNP: No components found with this basename: POCBNP,  CBG: No results found for this basename: GLUCAP,  in the last 168 hours  Time coordinating discharge:  Greater than 30 minutes  Signed:  Paylin Hailu, DO [facility] Pager: [phone] 08/04/2013, 5:06 PM

## 2013-08-04 NOTE — Progress Notes (Signed)
Utilization Review completed.  

## 2013-08-04 NOTE — ED Provider Notes (Signed)
CSN: [id number]     Arrival date & time 08/04/13  0039 History   First MD Initiated Contact with Patient 08/04/13 0121     Chief Complaint  Patient presents with  . Chest Pain  . Hypertension   (Consider location/radiation/quality/duration/timing/severity/associated sxs/prior Treatment) HPI Comments: 62 yo wf with h/o HTN and CAD presents with cc of elevated BP at home.  Pt was using a computer and then broke into a sweat and felt "like she was floating".  She reports she took her BP and it was 237/200 and HR was 110.  Pt t/s to ER via EMS - 207/97 with HR of 110.  EMS gave asa and ntg.  Symptoms improved.    Pt has at home ntg, but didn't take it.    PCP: none No previous cath or recent cardiology eval.  Pt states she has had a DVT in bilat legs in the past.  Not on blood thinners currently.     Patient is a 62 y.o. female presenting with chest pain and hypertension. The history is provided by the patient, the EMS personnel and a relative.  Chest Pain Pain location:  Substernal area Pain quality: aching   Pain radiates to:  Does not radiate Pain radiates to the back: no   Pain severity:  Mild Onset quality:  Gradual Timing:  Intermittent Progression:  Partially resolved Chronicity:  Recurrent Context: not breathing, no drug use and not eating   Relieved by:  None tried Worsened by:  Nothing tried Associated symptoms: nausea   Associated symptoms: no abdominal pain and not vomiting   Hypertension Associated symptoms include chest pain. Pertinent negatives include no abdominal pain.    Past Medical History  Diagnosis Date  . Hypertension    Past Surgical History  Procedure Laterality Date  . Cesarean section    . Breast surgery     No family history on file. History  Substance Use Topics  . Smoking status: Current Every Day Smoker -- 0.50 packs/day    Types: Cigarettes  . Smokeless tobacco: Not on file  . Alcohol Use: No   OB History   Grav Para Term Preterm  Abortions TAB SAB Ect Mult Living                 Review of Systems  Constitutional: Negative.   HENT: Negative.   Eyes: Negative.   Respiratory: Negative.   Cardiovascular: Positive for chest pain.  Gastrointestinal: Positive for nausea. Negative for vomiting, abdominal pain, diarrhea, constipation, blood in stool, anal bleeding and rectal pain.  Endocrine: Negative.   Genitourinary: Negative.   Musculoskeletal: Negative.   Allergic/Immunologic: Negative.   Neurological: Negative.   Hematological: Negative.   All other systems reviewed and are negative.    Allergies  Review of patient's allergies indicates no known allergies.  Home Medications   Current Outpatient Rx  Name  Route  Sig  Dispense  Refill  . hydrochlorothiazide (HYDRODIURIL) 25 MG tablet   Oral   Take 25 mg by mouth daily.         Marland Kitchen HYDROcodone-acetaminophen (LORTAB) 10-500 MG per tablet   Oral   Take 1 tablet by mouth every 6 (six) hours as needed (for migraine headaches).         . nitroGLYCERIN (NITROSTAT) 0.4 MG SL tablet   Sublingual   Place 0.4 mg under the tongue every 5 (five) minutes as needed for chest pain.          BP 146/73  Pulse 98  Temp(Src) 98.3 F (36.8 C) (Oral)  Resp 20  SpO2 96% Physical Exam  Vitals reviewed. Constitutional: She is oriented to person, place, and time. She appears well-developed and well-nourished.  HENT:  Head: Normocephalic and atraumatic.  Eyes: Conjunctivae are normal. Pupils are equal, round, and reactive to light.  Neck: Normal range of motion. Neck supple.  Cardiovascular: Normal rate and regular rhythm.   Pulmonary/Chest: Effort normal and breath sounds normal.  Abdominal: Soft. Bowel sounds are normal. There is no tenderness. There is no rebound and no guarding.  Musculoskeletal: Normal range of motion. She exhibits no edema and no tenderness.  Neurological: She is alert and oriented to person, place, and time.  Skin: Skin is warm and dry.   Psychiatric: She has a normal mood and affect.    ED Course  Procedures (including critical care time) Labs Review Labs Reviewed  CBC - Abnormal; Notable for the following:    MCHC 36.3 (*)    All other components within normal limits  COMPREHENSIVE METABOLIC PANEL - Abnormal; Notable for the following:    Sodium 130 (*)    Potassium 3.2 (*)    Chloride 95 (*)    Glucose, Bld 115 (*)    BUN 5 (*)    All other components within normal limits  PRO B NATRIURETIC PEPTIDE - Abnormal; Notable for the following:    Pro B Natriuretic peptide (BNP) 150.4 (*)    All other components within normal limits  POCT I-STAT TROPONIN I   Imaging Review Dg Chest 2 View  08/04/2013   CLINICAL DATA:  Chest pain  EXAM: CHEST  2 VIEW  COMPARISON:  Prior radiograph from 04/22/2006  FINDINGS: The cardiac and mediastinal silhouettes are stable in size and contour, and remain within normal limits.  Lungs are normally inflated. There is mild diffuse prominence of the mid interstitial markings, likely related to underlying bronchitic changes from history smoking. No focal infiltrate, pulmonary edema, or pleural effusion identified. No pneumothorax.  Osseous structures are within normal limits.  IMPRESSION: Diffuse bronchitic changes, likely related to a history of smoking. No acute cardiopulmonary process.   Electronically Signed   By: Rise Mu M.D.   On: 08/04/2013 03:06    EKG Interpretation    Date/Time:  Saturday August 04 2013 00:51:11 EST Ventricular Rate:  98 PR Interval:  147 QRS Duration: 79 QT Interval:  352 QTC Calculation: 449 R Axis:   20 Text Interpretation:  Age not entered, assumed to be  62 years old for purpose of ECG interpretation Sinus rhythm Confirmed by [facility]  MD, DAVID [phone]) on 08/04/2013 1:32:04 AM           Results for orders placed during the hospital encounter of 08/04/13  CBC      Result Value Range   WBC 6.4  4.0 - 10.5 K/uL   RBC 4.17  3.87 - 5.11  MIL/uL   Hemoglobin 13.2  12.0 - 15.0 g/dL   HCT 96.0  45.4 - 09.8 %   MCV 87.3  78.0 - 100.0 fL   MCH 31.7  26.0 - 34.0 pg   MCHC 36.3 (*) 30.0 - 36.0 g/dL   RDW 11.9  14.7 - 82.9 %   Platelets 170  150 - 400 K/uL  COMPREHENSIVE METABOLIC PANEL      Result Value Range   Sodium 130 (*) 135 - 145 mEq/L   Potassium 3.2 (*) 3.5 - 5.1 mEq/L   Chloride 95 (*)  96 - 112 mEq/L   CO2 23  19 - 32 mEq/L   Glucose, Bld 115 (*) 70 - 99 mg/dL   BUN 5 (*) 6 - 23 mg/dL   Creatinine, Ser 1.61  0.50 - 1.10 mg/dL   Calcium 9.0  8.4 - 09.6 mg/dL   Total Protein 6.7  6.0 - 8.3 g/dL   Albumin 3.8  3.5 - 5.2 g/dL   AST 26  0 - 37 U/L   ALT 18  0 - 35 U/L   Alkaline Phosphatase 64  39 - 117 U/L   Total Bilirubin 0.3  0.3 - 1.2 mg/dL   GFR calc non Af Amer >90  >90 mL/min   GFR calc Af Amer >90  >90 mL/min  PRO B NATRIURETIC PEPTIDE      Result Value Range   Pro B Natriuretic peptide (BNP) 150.4 (*) 0 - 125 pg/mL  POCT I-STAT TROPONIN I      Result Value Range   Troponin i, poc 0.01  0.00 - 0.08 ng/mL   Comment 3             MDM   1. Chest pain   2. Hypertension   3. History of DVT (deep vein thrombosis)    62 year old white female presents emergency department via EMS for chief complaint of hypertensive episode, chest pain, diaphoresis. Patient received full dose aspirin and nitroglycerin from EMS. Her symptoms improved significantly. ER workup includes negative troponin, mild elevation of BNP, low sodium low potassium, chest x-ray without acute cardiopulmonary disease and EKG without evidence of ischemia.  Patient does have a history of DVT in the past however she denies leg pain at this time and her presentation is not consistent with PE.  Filed Vitals:   08/04/13 0053  BP: 146/73  Pulse: 98  Temp: 98.3 F (36.8 C)  Resp: 20   3:53 AM Case discussed with hospitalist admit for further evaluation and workup. Hold on low molecular weight heparin or unfractionated heparin at this time.  Further evaluation and management deferred to inpatient team.   The patient appears reasonably stabilized for admission considering the current resources, flow, and capabilities available in the ED at this time, and I doubt any other [facility] requiring further screening and/or treatment in the ED prior to admission.    Darlys Gales, MD 08/04/13 1344

## 2013-08-04 NOTE — ED Notes (Signed)
Per EMS, pt reported having a hypertensive episode starting at 21:00. EMS reported a BP: 207/91 HR:110. Pt reported having CP EMS gave 1 nitro and 1 baby Asprin. Pt has had some CP relief but still having 2 out of 10 CP. Pt took 2  HCTZ 25mg  tablets at home to try and manage BP.

## 2013-08-04 NOTE — H&P (Signed)
[facility] History and Physical  Dawn Mckay ZOX:[id number] DOB: December 09, 1950 DOA: 08/04/2013  Referring physician:  EDP PCP: No PCP Per Patient  Specialists:   Chief Complaint: Chest Pain  HPI: Dawn Mckay is a 62 y.o. female who presents to the ED with complaints of SSCP that began at about 9 pm.  She was sitting at her computer and began to feel bad and then became diaphoretic and nauseous and had chest heaviness.  She called her son who came over and she and he took her blood pressures several times and found it to be 200/100  So she took 2 doses of her HCTZ and they kept checking her blood pressure and it would come down a little but go right back up so she came to the Ed for evaluation.      Review of Systems: The patient denies anorexia, fever, chills, headaches, weight loss, vision loss, diplopia, dizziness, decreased hearing, rhinitis, hoarseness, syncope, dyspnea on exertion, peripheral edema, balance deficits, cough, hemoptysis, abdominal pain, vomiting, diarrhea, constipation, hematemesis, melena, hematochezia, severe indigestion/heartburn, dysuria, hematuria, incontinence, muscle weakness, suspicious skin lesions, transient blindness, difficulty walking, depression, unusual weight change, abnormal bleeding, enlarged lymph nodes, angioedema, and breast masses.    Past Medical History  Diagnosis Date  . Hypertension   . CAD (coronary artery disease)   . History of breast cancer     Past Surgical History  Procedure Laterality Date  . Cesarean section    . Breast surgery      due to Cancer    Prior to Admission medications   Medication Sig Start Date End Date Taking? Authorizing Provider  hydrochlorothiazide (HYDRODIURIL) 25 MG tablet Take 25 mg by mouth daily.   Yes Historical Provider, MD  HYDROcodone-acetaminophen (LORTAB) 10-500 MG per tablet Take 1 tablet by mouth every 6 (six) hours as needed (for migraine headaches).   Yes Historical  Provider, MD  nitroGLYCERIN (NITROSTAT) 0.4 MG SL tablet Place 0.4 mg under the tongue every 5 (five) minutes as needed for chest pain.   Yes Historical Provider, MD    No Known Allergies  Social History:  reports that she has been smoking Cigarettes.  She has been smoking about 0.50 packs per day. She does not have any smokeless tobacco history on file. She reports that she does not drink alcohol or use illicit drugs.     Family History:     CAD in Brother  HTN in Brother  Prostate Cancer in Brother  Breast Cancer in Daughter     Physical Exam:  GEN:  Pleasant  Obese and Elderly appearing 62 y.o. Caucasian female  examined  and in no acute distress; cooperative with exam Filed Vitals:   08/04/13 0330 08/04/13 0345 08/04/13 0400 08/04/13 0415  BP: 128/70 140/65 98/82 134/69  Pulse: 74 73 67 72  Temp:      TempSrc:      Resp: 19 15 14 19   SpO2: 96% 95% 95% 96%   Blood pressure 134/69, pulse 72, temperature 98.3 F (36.8 C), temperature source Oral, resp. rate 19, SpO2 96.00%. PSYCH: SHe is alert and oriented x4; does not appear anxious does not appear depressed; affect is normal HEENT: Normocephalic and Atraumatic, Mucous membranes pink; PERRLA; EOM intact; Fundi:  Benign;  No scleral icterus, Nares: Patent, Oropharynx: Clear, Fair Dentition, Neck:  FROM, no cervical lymphadenopathy nor thyromegaly or carotid bruit; no JVD; Breasts:: Not examined CHEST WALL: No tenderness CHEST: Normal respiration, clear to auscultation bilaterally HEART: Regular  rate and rhythm; no murmurs rubs or gallops BACK: No kyphosis or scoliosis; no CVA tenderness ABDOMEN: Positive Bowel Sounds, Obese, soft non-tender; no masses, no organomegaly, no pannus; no intertriginous candida. Rectal Exam: Not done EXTREMITIES: No cyanosis, clubbing or edema; no ulcerations. Genitalia: not examined PULSES: 2+ and symmetric SKIN: Normal hydration no rash or ulceration CNS: Cranial nerves 2-12 grossly intact  no focal neurologic deficit    Labs on Admission:  Basic Metabolic Panel:  Recent Labs Lab 08/04/13 0137  NA 130*  K 3.2*  CL 95*  CO2 23  GLUCOSE 115*  BUN 5*  CREATININE 0.62  CALCIUM 9.0   Liver Function Tests:  Recent Labs Lab 08/04/13 0137  AST 26  ALT 18  ALKPHOS 64  BILITOT 0.3  PROT 6.7  ALBUMIN 3.8   No results found for this basename: LIPASE, AMYLASE,  in the last 168 hours No results found for this basename: AMMONIA,  in the last 168 hours CBC:  Recent Labs Lab 08/04/13 0137  WBC 6.4  HGB 13.2  HCT 36.4  MCV 87.3  PLT 170   Cardiac Enzymes: No results found for this basename: CKTOTAL, CKMB, CKMBINDEX, TROPONINI,  in the last 168 hours  BNP (last 3 results)  Recent Labs  08/04/13 0137  PROBNP 150.4*   CBG: No results found for this basename: GLUCAP,  in the last 168 hours  Radiological Exams on Admission: Dg Chest 2 View  08/04/2013   CLINICAL DATA:  Chest pain  EXAM: CHEST  2 VIEW  COMPARISON:  Prior radiograph from 04/22/2006  FINDINGS: The cardiac and mediastinal silhouettes are stable in size and contour, and remain within normal limits.  Lungs are normally inflated. There is mild diffuse prominence of the mid interstitial markings, likely related to underlying bronchitic changes from history smoking. No focal infiltrate, pulmonary edema, or pleural effusion identified. No pneumothorax.  Osseous structures are within normal limits.  IMPRESSION: Diffuse bronchitic changes, likely related to a history of smoking. No acute cardiopulmonary process.   Electronically Signed   By: Rise Mu M.D.   On: 08/04/2013 03:06     EKG: Independently reviewed.  Normal sinus Rhythm without Acute S-T changes.     Assessment/Plan Principal Problem:   Chest pain Active Problems:   CAD (coronary artery disease)   Hypertension   Hyponatremia   Hypokalemia   Tobacco use disorder    1.  Chest Pain-  Admit to Telemetry Bed for cardiac  monitoring, Cycle Troponins, Nitropaste,mO2 And ASA Rx.    2. CAD-  Self Reported Hx.    3.  HTN- on HCTZ at Home.  Monitor BPs.    4.  Hyponatremia - due to the double dose of HCTZ taken, Gentle NSS IVFs, and moniotr Na+ levels.    5.  Hypokalemia- due to the HCTZ ,  Replete K+, and check Magnesium level.   6.  Tobacco Use Disorder-   Has Decreased from 2 ppd now on 1/2 ppd,  Continue to decrease until complete cessation, Nicotine patch while in Hospital.        Code Status:    FULL CODE Family Communication:    Son at Bedside Disposition Plan:   Observation      Time spent:  69 Minutes  Ron Parker [facility] Pager [phone]  If 7PM-7AM, please contact night-coverage [email or web address] Password [email or web address] 08/04/2013, 4:29 AM

## 2013-08-04 NOTE — ED Notes (Addendum)
Pt alert, NAD, calm, interactive, skin W&D, resps e/u, speaking in clear complete sentences, family at [facility], updated, VSS, NSR on monitor, (denies: sob, nausea), admits to some CP (2/10 heaviness), dizziness, burred vision. Dr. Mort Sawyers (admitting) at [facility].

## 2013-09-17 ENCOUNTER — Encounter (HOSPITAL_COMMUNITY): Payer: Self-pay | Admitting: Emergency Medicine

## 2013-09-17 ENCOUNTER — Emergency Department (HOSPITAL_COMMUNITY)
Admission: EM | Admit: 2013-09-17 | Discharge: 2013-09-17 | Disposition: A | Discharge status: Home / Self Care | Payer: Self-pay | Attending: Emergency Medicine | Admitting: Emergency Medicine

## 2013-09-17 DIAGNOSIS — I2510 Atherosclerotic heart disease of native coronary artery without angina pectoris: Secondary | ICD-10-CM | POA: Insufficient documentation

## 2013-09-17 DIAGNOSIS — Z79899 Other long term (current) drug therapy: Secondary | ICD-10-CM | POA: Insufficient documentation

## 2013-09-17 DIAGNOSIS — E871 Hypo-osmolality and hyponatremia: Secondary | ICD-10-CM

## 2013-09-17 DIAGNOSIS — F17200 Nicotine dependence, unspecified, uncomplicated: Secondary | ICD-10-CM | POA: Insufficient documentation

## 2013-09-17 DIAGNOSIS — I10 Essential (primary) hypertension: Secondary | ICD-10-CM | POA: Insufficient documentation

## 2013-09-17 DIAGNOSIS — N390 Urinary tract infection, site not specified: Secondary | ICD-10-CM

## 2013-09-17 DIAGNOSIS — E876 Hypokalemia: Secondary | ICD-10-CM

## 2013-09-17 DIAGNOSIS — Z853 Personal history of malignant neoplasm of breast: Secondary | ICD-10-CM | POA: Insufficient documentation

## 2013-09-17 DIAGNOSIS — R0789 Other chest pain: Secondary | ICD-10-CM | POA: Insufficient documentation

## 2013-09-17 LAB — URINALYSIS, ROUTINE W REFLEX MICROSCOPIC
Bilirubin Urine: NEGATIVE
Glucose, UA: NEGATIVE mg/dL
Ketones, ur: NEGATIVE mg/dL
Nitrite: POSITIVE — AB
Protein, ur: NEGATIVE mg/dL
Specific Gravity, Urine: <1.005 — ABNORMAL LOW (ref 1.005–1.030)
Urobilinogen, UA: 1 mg/dL (ref 0.0–1.0)
pH: 6 (ref 5.0–8.0)

## 2013-09-17 LAB — CBC WITH DIFFERENTIAL/PLATELET
Basophils Absolute: 0 10*3/uL (ref 0.0–0.1)
Basophils Relative: 0 % (ref 0–1)
Eosinophils Absolute: 0 10*3/uL (ref 0.0–0.7)
Eosinophils Relative: 0 % (ref 0–5)
HCT: 39.8 % (ref 36.0–46.0)
Hemoglobin: 14.6 g/dL (ref 12.0–15.0)
Lymphocytes Relative: 21 % (ref 12–46)
Lymphs Abs: 2 10*3/uL (ref 0.7–4.0)
MCH: 31.3 pg (ref 26.0–34.0)
MCHC: 36.7 g/dL — ABNORMAL HIGH (ref 30.0–36.0)
MCV: 85.2 fL (ref 78.0–100.0)
Monocytes Absolute: 0.7 10*3/uL (ref 0.1–1.0)
Monocytes Relative: 8 % (ref 3–12)
Neutro Abs: 6.6 10*3/uL (ref 1.7–7.7)
Neutrophils Relative %: 71 % (ref 43–77)
Platelets: 280 10*3/uL (ref 150–400)
RBC: 4.67 MIL/uL (ref 3.87–5.11)
RDW: 13.1 % (ref 11.5–15.5)
WBC: 9.2 10*3/uL (ref 4.0–10.5)

## 2013-09-17 LAB — BASIC METABOLIC PANEL
BUN: 3 mg/dL — ABNORMAL LOW (ref 6–23)
CO2: 28 mEq/L (ref 19–32)
Calcium: 9.4 mg/dL (ref 8.4–10.5)
Chloride: 89 mEq/L — ABNORMAL LOW (ref 96–112)
Creatinine, Ser: 0.53 mg/dL (ref 0.50–1.10)
GFR calc Af Amer: >90 mL/min (ref >90)
GFR calc non Af Amer: >90 mL/min (ref >90)
Glucose, Bld: 120 mg/dL — ABNORMAL HIGH (ref 70–99)
Potassium: 2.8 mEq/L — CL (ref 3.7–5.3)
Sodium: 129 mEq/L — ABNORMAL LOW (ref 137–147)

## 2013-09-17 LAB — URINE MICROSCOPIC-ADD ON

## 2013-09-17 LAB — MAGNESIUM: Magnesium: 2.3 mg/dL (ref 1.5–2.5)

## 2013-09-17 MED ORDER — CIPROFLOXACIN HCL 250 MG PO TABS
500.0000 mg | ORAL_TABLET | Freq: Once | ORAL | Status: AC
  Administered 2013-09-17: 500 mg via ORAL
  Filled 2013-09-17: qty 2

## 2013-09-17 MED ORDER — CIPROFLOXACIN HCL 500 MG PO TABS: 500.0000 mg | ORAL_TABLET | Freq: Two times a day (BID) | ORAL | Status: DC

## 2013-09-17 MED ORDER — POTASSIUM CHLORIDE CRYS ER 20 MEQ PO TBCR
60.0000 meq | EXTENDED_RELEASE_TABLET | Freq: Once | ORAL | Status: AC
  Administered 2013-09-17: 60 meq via ORAL
  Filled 2013-09-17: qty 3

## 2013-09-17 MED ORDER — AMLODIPINE BESYLATE 5 MG PO TABS: 5.0000 mg | ORAL_TABLET | Freq: Every day | ORAL | Status: DC

## 2013-09-17 NOTE — Discharge Instructions (Signed)
Hyponatremia  Hyponatremia is when the salt (sodium) in your blood is low. When salt becomes low, your cells take in extra water and puff up (swell). The puffiness can happen in the whole body. It mostly affects the brain and is very serious.  HOME CARE  Only take medicine as told by your doctor.  Follow any diet instructions you were given. This includes limiting how much fluid you drink.  Keep all doctor visits for tests as told.  Avoid alcohol and drugs. GET HELP RIGHT AWAY IF:  You start to twitch and shake (seize).  You pass out (faint).  You continue to have watery poop (diarrhea) or you throw up (vomit).  You feel sick to your stomach (nauseous).  You are tired (fatigued), have a headache, are confused, or feel weak.  Your problems that first brought you to the doctor come back.  You have trouble following your diet instructions. MAKE SURE YOU:   Understand these instructions.  Will watch your condition.  Will get help right away if you are not doing well or get worse. Document Released: 04/21/2011 Document Revised: 11/01/2011 Document Reviewed: 04/21/2011 [facility] Patient Information 2014 [place], [place].  Hypokalemia Hypokalemia means that the amount of potassium in the blood is lower than normal.Potassium is a chemical, called an electrolyte, that helps regulate the amount of fluid in the body. It also stimulates muscle contraction and helps nerves function properly.Most of the body's potassium is inside of cells, and only a very small amount is in the blood. Because the amount in the blood is so small, minor changes can be life-threatening. CAUSES  Antibiotics.  Diarrhea or vomiting.  Using laxatives too much, which can cause diarrhea.  Chronic kidney disease.  Water pills (diuretics).  Eating disorders (bulimia).  Low magnesium level.  Sweating a lot. SIGNS AND SYMPTOMS  Weakness.  Constipation.  Fatigue.  Muscle cramps.  Mental  confusion.  Skipped heartbeats or irregular heartbeat (palpitations).  Tingling or numbness. DIAGNOSIS  Your health care provider can diagnose hypokalemia with blood tests. In addition to checking your potassium level, your health care provider may also check other lab tests. TREATMENT Hypokalemia can be treated with potassium supplements taken by mouth or adjustments in your current medicines. If your potassium level is very low, you may need to get potassium through a vein (IV) and be monitored in the hospital. A diet high in potassium is also helpful. Foods high in potassium are:  Nuts, such as peanuts and pistachios.  Seeds, such as sunflower seeds and pumpkin seeds.  Peas, lentils, and lima beans.  Whole grain and bran cereals and breads.  Fresh fruit and vegetables, such as apricots, avocado, bananas, cantaloupe, kiwi, oranges, tomatoes, asparagus, and potatoes.  Orange and tomato juices.  Red meats.  Fruit yogurt. HOME CARE INSTRUCTIONS  Take all medicines as prescribed by your health care provider.  Maintain a healthy diet by including nutritious food, such as fruits, vegetables, nuts, whole grains, and lean meats.  If you are taking a laxative, be sure to follow the directions on the label. SEEK MEDICAL CARE IF:  Your weakness gets worse.  You feel your heart pounding or racing.  You are vomiting or having diarrhea.  You are diabetic and having trouble keeping your blood glucose in the normal range. SEEK IMMEDIATE MEDICAL CARE IF:  You have chest pain, shortness of breath, or dizziness.  You are vomiting or having diarrhea for more than 2 days.  You faint. MAKE SURE YOU:  Understand these instructions.  Will watch your condition.  Will get help right away if you are not doing well or get worse. Document Released: 08/09/2005 Document Revised: 05/30/2013 Document Reviewed: 02/09/2013 [facility] Patient Information 2014 [place].  Urinary Tract  Infection Urinary tract infections (UTIs) can develop anywhere along your urinary tract. Your urinary tract is your body's drainage system for removing wastes and extra water. Your urinary tract includes two kidneys, two ureters, a bladder, and a urethra. Your kidneys are a pair of bean-shaped organs. Each kidney is about the size of your fist. They are located below your ribs, one on each side of your spine. CAUSES Infections are caused by microbes, which are microscopic organisms, including fungi, viruses, and bacteria. These organisms are so small that they can only be seen through a microscope. Bacteria are the microbes that most commonly cause UTIs. SYMPTOMS  Symptoms of UTIs may vary by age and gender of the patient and by the location of the infection. Symptoms in young women typically include a frequent and intense urge to urinate and a painful, burning feeling in the bladder or urethra during urination. Older women and men are more likely to be tired, shaky, and weak and have muscle aches and abdominal pain. A fever may mean the infection is in your kidneys. Other symptoms of a kidney infection include pain in your back or sides below the ribs, nausea, and vomiting. DIAGNOSIS To diagnose a UTI, your caregiver will ask you about your symptoms. Your caregiver also will ask to provide a urine sample. The urine sample will be tested for bacteria and white blood cells. White blood cells are made by your body to help fight infection. TREATMENT  Typically, UTIs can be treated with medication. Because most UTIs are caused by a bacterial infection, they usually can be treated with the use of antibiotics. The choice of antibiotic and length of treatment depend on your symptoms and the type of bacteria causing your infection. HOME CARE INSTRUCTIONS  If you were prescribed antibiotics, take them exactly as your caregiver instructs you. Finish the medication even if you feel better after you have only taken  some of the medication.  Drink enough water and fluids to keep your urine clear or pale yellow.  Avoid caffeine, tea, and carbonated beverages. They tend to irritate your bladder.  Empty your bladder often. Avoid holding urine for long periods of time.  Empty your bladder before and after sexual intercourse.  After a bowel movement, women should cleanse from front to back. Use each tissue only once. SEEK MEDICAL CARE IF:   You have back pain.  You develop a fever.  Your symptoms do not begin to resolve within 3 days. SEEK IMMEDIATE MEDICAL CARE IF:   You have severe back pain or lower abdominal pain.  You develop chills.  You have nausea or vomiting.  You have continued burning or discomfort with urination. MAKE SURE YOU:   Understand these instructions.  Will watch your condition.  Will get help right away if you are not doing well or get worse. Document Released: 05/19/2005 Document Revised: 02/08/2012 Document Reviewed: 09/17/2011 [facility] Patient Information 2014 [place].   Emergency Department Resource Guide 1) Find a Doctor and Pay Out of Pocket Although you won't have to find out who is covered by your insurance plan, it is a good idea to ask around and get recommendations. You will then need to call the office and see if the doctor you have chosen  will accept you as a new patient and what types of options they offer for patients who are self-pay. Some doctors offer discounts or will set up payment plans for their patients who do not have insurance, but you will need to ask so you aren't surprised when you get to your appointment.  2) Contact Your Local Health Department Not all health departments have doctors that can see patients for sick visits, but many do, so it is worth a call to see if yours does. If you don't know where your local health department is, you can check in your phone book. The CDC also has a tool to help you locate your state's health  department, and many state websites also have listings of all of their local health departments.  3) Find a [facility] If your illness is not likely to be very severe or complicated, you may want to try a walk in clinic. These are popping up all over the country in pharmacies, drugstores, and shopping centers. They're usually staffed by nurse practitioners or physician assistants that have been trained to treat common illnesses and complaints. They're usually fairly quick and inexpensive. However, if you have serious medical issues or chronic medical problems, these are probably not your best option.  No Primary Care Doctor: - Call Health Connect at  [phone] - they can help you locate a primary care doctor that  accepts your insurance, provides certain services, etc. - Physician Referral Service- [phone]  Chronic Pain Problems: Organization         Address  Phone   Notes  [facility]  [phone] Patients need to be referred by their primary care doctor.   Medication Assistance: Organization         Address  Phone   Notes  [facility] Medication [facility] [address] [place], [place] [address] [phone] --Must be a resident of [facility] -- Must have NO insurance coverage whatsoever (no Medicaid/ Medicare, etc.) -- The pt. MUST have a primary care doctor that directs their care regularly and follows them in the community   MedAssist  [phone]   [employer]  [phone]    Agencies that provide inexpensive medical care: Organization         Address  Phone   Notes  [place]  [phone]   [facility]    [phone]   [facility] [place], [place] [address] [phone]   [place] [address], [place] [phone]   Planned Parenthood    [phone]   [facility]    [phone]    [place] and [place] [address], [place] Phone:  [phone], Fax:  [phone] Hours of Operation:  9 am - 6 pm, M-F.  Also accepts Medicaid/Medicare and self-pay.  [facility] for [place] [address], [address], [place] Phone: [phone], Fax: [phone]. Hours of Operation:  8:30 am - 5:30 pm, M-F.  Also accepts Medicaid and self-pay.  [facility] [place] [address], [place] Phone: [phone]   [place], [place], [place] [phone], Ext. [phone] Mondays & Thursdays: 7-9 AM.  First 15 patients are seen on a first come, first serve basis.    Jeff Providers:  Organization  Address  Phone   Notes  [facility] [address], [place] [phone] Also accepts self-pay patients.  [facility] [id number] [place], [place]  [phone]   [place], [address], [place] [phone]   [facility] [address], [place] [phone]   Lucianne Lei [address], [place]   [phone] Only accepts [place] Access [place] patients after they have their name applied to their card.   Self-Pay (no insurance) in [facility]:  Organization         Address  Phone   Notes  Sickle Cell Patients, [facility] [place] [phone]   [facility] [place] [phone]   [facility] [place]  [place], [place], [place] [phone]   [facility]/Dr. Osei-Bonsu  [address] [place] or [address], [place] [phone] Phone number for both [place] and [place] locations is the same.  Urgent Medical and [facility] [address] [place] [phone]    [facility] [address], [place] or [address] [phone]   [facility] [address] [place] [phone], phone; [phone], fax Sees patients 1st and 3rd Saturday of every month.  Must not qualify for public or private insurance (i.e. Medicaid, Medicare, [place] Health Choice, Veterans' Benefits)  Household income should be no more than 200% of the poverty level The clinic cannot treat you if you are pregnant or think you are pregnant  Sexually transmitted diseases are not treated at the clinic.    Dental Care: Organization         Address  Phone  Notes  [facility] Department of [facility] [place] [phone] Accepts children up to age 70 who are enrolled in [place] or [place]; pregnant women with a Medicaid card; and children who have applied for Medicaid or [place] Health Choice, but were declined, whose parents can pay a reduced fee at time of service.  [facility] Department of [facility]  [address], [place] [phone] Accepts children up to age 34 who are enrolled in [place] or [place]; pregnant women with a Medicaid card; and children who have applied for Medicaid or [place] Health Choice, but were declined, whose parents can pay a reduced fee at time of service.  [place] [facility] Access PRO[facility]  Sun [phone] Patients are seen by appointment only. Walk-ins are not accepted. [place] will see patients 75 years of age and older. Monday - Tuesday (8am-5pm) Most Wednesdays (8:30-5pm) $30 per visit, cash only  [facility] Adult Dental Access PROGRAM  [address], [facility] And [place] [phone] Patients are seen by appointment only. Walk-ins are not accepted. [place] will see patients 66 years of age and older. One Wednesday Evening (Monthly: Volunteer Based).   $30 per visit, cash only  [place]  [phone] for adults; Children under age 67, call Graduate Pediatric Dentistry at [phone]. Children aged 79-14, please call [phone] to request a pediatric application.  Dental services are provided in all areas of dental care including  fillings, crowns and bridges, complete and partial dentures, implants, gum treatment, root canals, and extractions. Preventive care is also provided. Treatment is provided to both adults and children. Patients are selected via a lottery and there is often a waiting list.   [facility] [address] [place]  [phone] [email or web address]   [facility] [address] [place], [place] [phone], Ext. [phone] Second and Fourth Thursday of each month, opens at 6:30 AM; Clinic ends at 9 AM.  Patients are seen on a first-come first-served basis, and a limited number are seen during each clinic.   [facility]  [address] Hillard Danker [place], [place] [phone]   Eligibility Requirements You must have lived in [place], [place], or [place] counties for at least the last three months.   You cannot be eligible for state or federal sponsored [employer], including [employer], [place], or [employer].   You generally cannot be eligible for healthcare insurance through your employer.    How to apply: Eligibility screenings are held every Tuesday and Wednesday afternoon from 1:00 pm until 4:00 pm. You do not need an appointment for the interview!  [facility] [address] [place], [place]   [place]  [place] Department  [place]  [phone]    Behavioral Health Resources in the Community: Intensive Outpatient Programs Organization         Address  Phone  Notes  [place]. [address] [place], [place] [phone]   [facility] [address], [place], [place]   ADS: Alcohol & Drug Svcs [address] [place], [place]   [place] [address],  [place], [place] or [phone]   Substance Abuse Resources Organization         Address  Phone  Notes  Alcohol and Drug Services  [phone]   [place]  [phone]   [facility] [place]   Chinita Pester  [phone]   Residential & Outpatient Substance Abuse Program  [phone]   Psychological Services Organization         Address  Phone  Notes  [facility]  [place]  [phone]   [place] [address] [place] or [phone]    Mobile Crisis Teams Organization         Address  Phone  Notes  [facility], Mobile Crisis Care Unit  [phone]   [facility] Psychotherapeutic Services  [address] [place], [place]   [place] [address], [place] [phone]    Self-Help/Support Groups Organization         Address  Phone             Notes  [place]. of [place] - variety of support groups  [place] Call for more information  Narcotics Anonymous (NA), [facility] [address], [employer] [place]  2 meetings at this location   Special educational needs teacher         Address  Phone  Notes  [facility]idential [facility] [place],    [place]  [phone]   [facility]  [address] [place] [id number], [place], [place]   [place], [place]  [place] [phone] Admissions: 8am-3pm M-F  Incentives Substance [place] [address],    [place], [place] [phone]   [facility] [address] [place], [place], [place]   [facility] [address],  [place], [place]   [facility] Programs - Intensive Outpatient [address]een [address], [place], [place]   [facility] ([place] [place].,  [place], [place] [phone] or [phone]   [facility] Services (RTS) [address] [place], [place] Accepts Medicaid  [facility] [place] [address],  [place] [phone] Substance Abuse/Addiction Treatment   [facility] Organization         Address  Phone  Notes  [facility]  [phone]   Domenic Schwab, PhD [address] Arlis [place], [place]   [phone] or [phone]   [place], [place] [phone]   [facility] [address] [place], [place] [phone] Insurance/Medicaid/sponsorship through [facility] and Families [address], [address] [place]                                    [place], [place] [phone] [place] [address], [place] [phone]    Dr. Adele Schilder  [phone]   [facility] Dept. 1) [address], [place] 2) [place] 3)  [address], [place] [phone]  [phone]   [place] [phone] or [phone] (After Hours)

## 2013-09-17 NOTE — ED Notes (Signed)
C/o dysuria as well. Took AZO appox 1400 today.

## 2013-09-17 NOTE — ED Notes (Signed)
Obtained EKG in Triage, pulled old one from [place] and handled both to Dr Wilson Singer

## 2013-09-17 NOTE — ED Notes (Signed)
CRITICAL VALUE ALERT  Critical value received:  K+ 2.8 Date of notification:  09/17/13 Time of notification:  2019 Critical value read back:yes  Nurse who received alert:  Kayleen Memos, RN MD notified:  Dr. Wilson Singer Time:  2019

## 2013-09-17 NOTE — ED Notes (Signed)
Blood work done on Wednesday from [facility]. Pt was called today with low na and low k+ per pt. C/o headache. Denies any confusion. Has periods of chest pain.

## 2013-09-17 NOTE — ED Provider Notes (Signed)
CSN: [id number]     Arrival date & time 09/17/13  1710 History   First MD Initiated Contact with Patient 09/17/13 1831     Chief Complaint  Patient presents with  . low potassium- low sodium per pt from [facility]    (Consider location/radiation/quality/duration/timing/severity/associated sxs/prior Treatment) HPI  [age]-year-old female referred to the ER for further evaluation of hypokalemia and hyponatremia. She is unsure of the exact levels. She has no acute complaints. Review of symptoms she does endorse some intermittent chest pain. She was admitted to the hospital approximately one month ago for evaluation of this. She had a low risk stress test at that time. Hypokalemia and hyponatremia was noted then as well. She had repeat blood work after she was discharged as part of followup.   Past Medical History  Diagnosis Date  . Hypertension   . CAD (coronary artery disease)   . History of breast cancer    Past Surgical History  Procedure Laterality Date  . Cesarean section    . Breast surgery      due to Cancer   No family history on file. History  Substance Use Topics  . Smoking status: Current Every Day Smoker -- 0.50 packs/day    Types: Cigarettes  . Smokeless tobacco: Not on file  . Alcohol Use: No   OB History   Grav Para Term Preterm Abortions TAB SAB Ect Mult Living                 Review of Systems  All systems reviewed and negative, other than as noted in HPI.   Allergies  Review of patient's allergies indicates no known allergies.  Home Medications   Current Outpatient Rx  Name  Route  Sig  Dispense  Refill  . hydrochlorothiazide (HYDRODIURIL) 25 MG tablet   Oral   Take 25 mg by mouth daily.         . naproxen (NAPROSYN) 500 MG tablet   Oral   Take 500 mg by mouth 2 (two) times daily as needed. pain         . nitroGLYCERIN (NITROSTAT) 0.4 MG SL tablet   Sublingual   Place 0.4 mg under the tongue every 5 (five) minutes as needed for chest  pain.          BP 173/77  Pulse 105  Temp(Src) 98.2 F (36.8 C) (Oral)  Resp 16  SpO2 95% Physical Exam  Nursing note and vitals reviewed. Constitutional: She appears well-developed and well-nourished. No distress.  HENT:  Head: Normocephalic and atraumatic.  Eyes: Conjunctivae are normal. Right eye exhibits no discharge. Left eye exhibits no discharge.  Neck: Neck supple.  Cardiovascular: Normal rate, regular rhythm and normal heart sounds.  Exam reveals no gallop and no friction rub.   No murmur heard. Pulmonary/Chest: Effort normal and breath sounds normal. No respiratory distress.  Abdominal: Soft. She exhibits no distension. There is tenderness.   Suprapubic tenderness without rebound or guarding.  Genitourinary:  No costovertebral angle tenderness  Musculoskeletal: She exhibits no edema and no tenderness.  Neurological: She is alert.  Skin: Skin is warm and dry.  Psychiatric: She has a normal mood and affect. Her behavior is normal. Thought content normal.    ED Course  Procedures (including critical care time) Labs Review Labs Reviewed  CBC WITH DIFFERENTIAL - Abnormal; Notable for the following:    MCHC 36.7 (*)    All other components within normal limits  BASIC METABOLIC PANEL - Abnormal;  Notable for the following:    Sodium 129 (*)    Potassium 2.8 (*)    Chloride 89 (*)    Glucose, Bld 120 (*)    BUN 3 (*)    All other components within normal limits  URINALYSIS, ROUTINE W REFLEX MICROSCOPIC - Abnormal; Notable for the following:    Color, Urine ORANGE (*)    Specific Gravity, Urine <1.005 (*)    Hgb urine dipstick SMALL (*)    Nitrite POSITIVE (*)    Leukocytes, UA TRACE (*)    All other components within normal limits  URINE MICROSCOPIC-ADD ON - Abnormal; Notable for the following:    Squamous Epithelial / LPF FEW (*)    Bacteria, UA FEW (*)    All other components within normal limits  URINE CULTURE  MAGNESIUM   Imaging Review No results  found.  EKG Interpretation    Date/Time:  Monday September 17 2013 18:20:01 EST Ventricular Rate:  97 PR Interval:  148 QRS Duration: 68 QT Interval:  356 QTC Calculation: 452 R Axis:   3 Text Interpretation:  Normal sinus rhythm with sinus arrhythmia Cannot rule out Anterior infarct , age undetermined Abnormal ECG When compared with ECG of 04-Aug-2013 00:51, PREVIOUS ECG IS PRESENT ED PHYSICIAN INTERPRETATION AVAILABLE IN [facility] Confirmed by TEST, RECORD ([id number]) on 09/19/2013 6:58:16 AM            MDM   1. UTI (urinary tract infection)   2. Hyponatremia   3. Hypokalemia    62yF with hypokalemia and hyponatremia. Ongoing issues for pt. On HCTZ which may be contributing. Will stop and add amlodipine. Needs to discuss further with PCP. Abx for UTI.     Virgel Manifold, MD 09/20/13 [phone]

## 2013-09-21 LAB — URINE CULTURE: Colony Count: 50000

## 2013-09-23 ENCOUNTER — Telehealth (HOSPITAL_COMMUNITY): Payer: Self-pay | Admitting: Emergency Medicine

## 2013-09-23 NOTE — ED Notes (Signed)
Post ED Visit - Positive Culture Follow-up  Culture report reviewed by antimicrobial stewardship pharmacist: []  Wes [place]akiak, Pharm.D., BCPS [x]  Heide Guile, Pharm.D., BCPS []  Alycia Rossetti, Pharm.D., BCPS []  [place], Pharm.D., BCPS, AAHIVP []  Legrand Como, Pharm.D., BCPS, AAHIVP  Positive urine culture Treated with Cipro, organism sensitive to the same and no further patient follow-up is required at this time.  Myrna Blazer 09/23/2013, 5:51 PM

## 2013-10-26 ENCOUNTER — Observation Stay (HOSPITAL_COMMUNITY)
Admission: EM | Admit: 2013-10-26 | Discharge: 2013-10-27 | Disposition: A | Discharge status: Home / Self Care | Payer: Self-pay | Attending: Family Medicine | Admitting: Family Medicine

## 2013-10-26 ENCOUNTER — Emergency Department (HOSPITAL_COMMUNITY): Payer: Self-pay

## 2013-10-26 ENCOUNTER — Encounter (HOSPITAL_COMMUNITY): Payer: Self-pay | Admitting: Emergency Medicine

## 2013-10-26 DIAGNOSIS — I2510 Atherosclerotic heart disease of native coronary artery without angina pectoris: Secondary | ICD-10-CM | POA: Insufficient documentation

## 2013-10-26 DIAGNOSIS — Z7982 Long term (current) use of aspirin: Secondary | ICD-10-CM | POA: Insufficient documentation

## 2013-10-26 DIAGNOSIS — I10 Essential (primary) hypertension: Secondary | ICD-10-CM | POA: Insufficient documentation

## 2013-10-26 DIAGNOSIS — R55 Syncope and collapse: Principal | ICD-10-CM | POA: Insufficient documentation

## 2013-10-26 DIAGNOSIS — G43909 Migraine, unspecified, not intractable, without status migrainosus: Secondary | ICD-10-CM | POA: Diagnosis present

## 2013-10-26 DIAGNOSIS — Z23 Encounter for immunization: Secondary | ICD-10-CM | POA: Insufficient documentation

## 2013-10-26 DIAGNOSIS — Z853 Personal history of malignant neoplasm of breast: Secondary | ICD-10-CM | POA: Insufficient documentation

## 2013-10-26 DIAGNOSIS — N390 Urinary tract infection, site not specified: Secondary | ICD-10-CM | POA: Diagnosis present

## 2013-10-26 DIAGNOSIS — F17200 Nicotine dependence, unspecified, uncomplicated: Secondary | ICD-10-CM | POA: Insufficient documentation

## 2013-10-26 DIAGNOSIS — F985 Adult onset fluency disorder: Secondary | ICD-10-CM | POA: Diagnosis present

## 2013-10-26 DIAGNOSIS — R079 Chest pain, unspecified: Secondary | ICD-10-CM | POA: Insufficient documentation

## 2013-10-26 DIAGNOSIS — K219 Gastro-esophageal reflux disease without esophagitis: Secondary | ICD-10-CM | POA: Insufficient documentation

## 2013-10-26 DIAGNOSIS — F8081 Childhood onset fluency disorder: Secondary | ICD-10-CM | POA: Insufficient documentation

## 2013-10-26 HISTORY — DX: Headache, unspecified: R51.9

## 2013-10-26 HISTORY — DX: Headache: R51

## 2013-10-26 HISTORY — DX: Other chronic pain: G89.29

## 2013-10-26 HISTORY — DX: Gastro-esophageal reflux disease without esophagitis: K21.9

## 2013-10-26 HISTORY — DX: Personal history of other medical treatment: Z92.89

## 2013-10-26 LAB — URINE MICROSCOPIC-ADD ON

## 2013-10-26 LAB — URINALYSIS, ROUTINE W REFLEX MICROSCOPIC
Bilirubin Urine: NEGATIVE
GLUCOSE, UA: NEGATIVE mg/dL
Ketones, ur: NEGATIVE mg/dL
NITRITE: POSITIVE — AB
PROTEIN: NEGATIVE mg/dL
UROBILINOGEN UA: 0.2 mg/dL (ref 0.0–1.0)
pH: 7 (ref 5.0–8.0)

## 2013-10-26 LAB — COMPREHENSIVE METABOLIC PANEL
ALBUMIN: 3.7 g/dL (ref 3.5–5.2)
ALK PHOS: 78 U/L (ref 39–117)
ALT: 11 U/L (ref 0–35)
AST: 16 U/L (ref 0–37)
BILIRUBIN TOTAL: 0.3 mg/dL (ref 0.3–1.2)
BUN: 4 mg/dL — ABNORMAL LOW (ref 6–23)
CO2: 26 mEq/L (ref 19–32)
Calcium: 9 mg/dL (ref 8.4–10.5)
Chloride: 103 mEq/L (ref 96–112)
Creatinine, Ser: 0.67 mg/dL (ref 0.50–1.10)
GFR calc Af Amer: >90 mL/min (ref >90)
GFR calc non Af Amer: >90 mL/min (ref >90)
Glucose, Bld: 100 mg/dL — ABNORMAL HIGH (ref 70–99)
POTASSIUM: 3.7 meq/L (ref 3.7–5.3)
Sodium: 141 mEq/L (ref 137–147)
TOTAL PROTEIN: 7.4 g/dL (ref 6.0–8.3)

## 2013-10-26 LAB — CBC WITH DIFFERENTIAL/PLATELET
BASOS PCT: 0 % (ref 0–1)
Basophils Absolute: 0 10*3/uL (ref 0.0–0.1)
Eosinophils Absolute: 0.1 10*3/uL (ref 0.0–0.7)
Eosinophils Relative: 1 % (ref 0–5)
HCT: 40.7 % (ref 36.0–46.0)
HEMOGLOBIN: 13.8 g/dL (ref 12.0–15.0)
Lymphocytes Relative: 20 % (ref 12–46)
Lymphs Abs: 1.6 10*3/uL (ref 0.7–4.0)
MCH: 31.3 pg (ref 26.0–34.0)
MCHC: 33.9 g/dL (ref 30.0–36.0)
MCV: 92.3 fL (ref 78.0–100.0)
MONOS PCT: 8 % (ref 3–12)
Monocytes Absolute: 0.6 10*3/uL (ref 0.1–1.0)
NEUTROS ABS: 5.6 10*3/uL (ref 1.7–7.7)
Neutrophils Relative %: 71 % (ref 43–77)
Platelets: 216 10*3/uL (ref 150–400)
RBC: 4.41 MIL/uL (ref 3.87–5.11)
RDW: 14.1 % (ref 11.5–15.5)
WBC: 7.9 10*3/uL (ref 4.0–10.5)

## 2013-10-26 LAB — TROPONIN I

## 2013-10-26 LAB — MAGNESIUM: MAGNESIUM: 2.1 mg/dL (ref 1.5–2.5)

## 2013-10-26 MED ORDER — PNEUMOCOCCAL VAC POLYVALENT 25 MCG/0.5ML IJ INJ
0.5000 mL | INJECTION | INTRAMUSCULAR | Status: AC
  Administered 2013-10-27: 0.5 mL via INTRAMUSCULAR
  Filled 2013-10-26: qty 0.5

## 2013-10-26 MED ORDER — DEXTROSE 5 % IV SOLN
1.0000 g | Freq: Once | INTRAVENOUS | Status: AC
  Administered 2013-10-26: 1 g via INTRAVENOUS
  Filled 2013-10-26: qty 10

## 2013-10-26 MED ORDER — MORPHINE SULFATE 4 MG/ML IJ SOLN
4.0000 mg | INTRAMUSCULAR | Status: AC | PRN
  Administered 2013-10-26 (×2): 4 mg via INTRAVENOUS
  Filled 2013-10-26 (×2): qty 1

## 2013-10-26 MED ORDER — ONDANSETRON HCL 4 MG PO TABS
4.0000 mg | ORAL_TABLET | Freq: Four times a day (QID) | ORAL | Status: DC | PRN
  Administered 2013-10-27: 4 mg via ORAL
  Filled 2013-10-26: qty 1

## 2013-10-26 MED ORDER — ONDANSETRON HCL 4 MG/2ML IJ SOLN: 4.0000 mg | Freq: Four times a day (QID) | INTRAMUSCULAR | Status: DC | PRN

## 2013-10-26 MED ORDER — SUMATRIPTAN SUCCINATE 6 MG/0.5ML ~~LOC~~ SOLN
6.0000 mg | Freq: Once | SUBCUTANEOUS | Status: AC
  Administered 2013-10-26: 6 mg via SUBCUTANEOUS
  Filled 2013-10-26 (×2): qty 0.5

## 2013-10-26 MED ORDER — ONDANSETRON HCL 4 MG/2ML IJ SOLN: 4.0000 mg | Freq: Three times a day (TID) | INTRAMUSCULAR | Status: DC | PRN

## 2013-10-26 MED ORDER — SODIUM CHLORIDE 0.9 % IV SOLN
INTRAVENOUS | Status: DC
  Administered 2013-10-26 – 2013-10-27 (×2): via INTRAVENOUS

## 2013-10-26 MED ORDER — INFLUENZA VAC SPLIT QUAD 0.5 ML IM SUSP
0.5000 mL | INTRAMUSCULAR | Status: AC
  Administered 2013-10-27: 0.5 mL via INTRAMUSCULAR
  Filled 2013-10-26: qty 0.5

## 2013-10-26 MED ORDER — DEXTROSE 5 % IV SOLN
1.0000 g | INTRAVENOUS | Status: DC
  Filled 2013-10-26 (×2): qty 10

## 2013-10-26 MED ORDER — ONDANSETRON HCL 4 MG/2ML IJ SOLN
4.0000 mg | INTRAMUSCULAR | Status: AC | PRN
  Administered 2013-10-26 (×2): 4 mg via INTRAVENOUS
  Filled 2013-10-26 (×2): qty 2

## 2013-10-26 MED ORDER — AMLODIPINE BESYLATE 5 MG PO TABS
5.0000 mg | ORAL_TABLET | Freq: Every day | ORAL | Status: DC
  Administered 2013-10-27: 5 mg via ORAL
  Filled 2013-10-26: qty 1

## 2013-10-26 MED ORDER — SODIUM CHLORIDE 0.9 % IV SOLN: INTRAVENOUS | Status: AC

## 2013-10-26 MED ORDER — NITROGLYCERIN 0.4 MG SL SUBL: 0.4000 mg | SUBLINGUAL_TABLET | SUBLINGUAL | Status: DC | PRN

## 2013-10-26 MED ORDER — SODIUM CHLORIDE 0.9 % IJ SOLN
3.0000 mL | Freq: Two times a day (BID) | INTRAMUSCULAR | Status: DC
  Administered 2013-10-27: 3 mL via INTRAVENOUS

## 2013-10-26 MED ORDER — ASPIRIN 325 MG PO TABS: 650.0000 mg | ORAL_TABLET | Freq: Every day | ORAL | Status: DC | PRN

## 2013-10-26 NOTE — ED Notes (Signed)
Chest pain, headache, pain in upper back.  , Hx of CAD, Friend with says she has developed a stutter.  And pt says she has had 2 syncopal episodes. In last 2 days

## 2013-10-26 NOTE — ED Notes (Signed)
Pt refused in and out cath

## 2013-10-26 NOTE — H&P (Signed)
Chief Complaint:  Passing out  HPI: 63 yo female h/o migraines, cad, htn comes in with several episodes of passing out over the last couple of days, getting really dizzy when she stands up.  No fever.  Gets dizzy, then starts to get blurred vision, gets foggy headed then passes out.  No n/v/d.  Eating and drinking ok.  No swelling anywhere.  Pt has also been fighting one of her migraines for the last 2 days, does not have any meds at home for this except otc.  Use to take imitrex that worked.    Review of Systems:  Positive and negative as per HPI otherwise all other systems are negative  Past Medical History: Past Medical History  Diagnosis Date  . Hypertension   . CAD (coronary artery disease)   . History of breast cancer   . History of stress test 07/2013    normal  . Chronic headache   . GERD (gastroesophageal reflux disease)    Past Surgical History  Procedure Laterality Date  . Cesarean section    . Breast surgery      due to Cancer  . Throat surgery    . Brain surgery      Medications: Prior to Admission medications   Medication Sig Start Date End Date Taking? Authorizing Provider  amLODipine (NORVASC) 5 MG tablet Take 1 tablet (5 mg total) by mouth daily. 09/17/13  Yes Virgel Manifold, MD  aspirin 325 MG tablet Take 650 mg by mouth daily as needed for mild pain, moderate pain or headache.   Yes Historical Provider, MD  nitroGLYCERIN (NITROSTAT) 0.4 MG SL tablet Place 0.4 mg under the tongue every 5 (five) minutes as needed for chest pain.   Yes Historical Provider, MD    Allergies:  No Known Allergies  Social History:  reports that she has been smoking Cigarettes.  She has been smoking about 0.50 packs per day. She does not have any smokeless tobacco history on file. She reports that she does not drink alcohol or use illicit drugs.  Family History: History reviewed. No pertinent family history.  Physical Exam: Filed Vitals:   10/26/13 1819 10/26/13 1820 10/26/13  2114 10/26/13 2241  BP: 150/84 153/75 132/70 131/83  Pulse: 76 83 72 69  Temp:   98.1 F (36.7 C) 97.3 F (36.3 C)  TempSrc:   Oral Oral  Resp:   15 16  Height:    5\' 3"  (1.6 m)  Weight:    77.474 kg (170 lb 12.8 oz)  SpO2:   99% 97%   General appearance: alert, cooperative and no distress Head: Normocephalic, without obvious abnormality, atraumatic Eyes: negative Nose: Nares normal. Septum midline. Mucosa normal. No drainage or sinus tenderness. Neck: no JVD and supple, symmetrical, trachea midline Lungs: clear to auscultation bilaterally Heart: regular rate and rhythm, S1, S2 normal, no murmur, click, rub or gallop Abdomen: soft, non-tender; bowel sounds normal; no masses,  no organomegaly Extremities: extremities normal, atraumatic, no cyanosis or edema Pulses: 2+ and symmetric Skin: Skin color, texture, turgor normal. No rashes or lesions Neurologic: Grossly normal    Labs on Admission:   Recent Labs  10/26/13 1609 10/26/13 1813  NA 141  --   K 3.7  --   CL 103  --   CO2 26  --   GLUCOSE 100*  --   BUN 4*  --   CREATININE 0.67  --   CALCIUM 9.0  --   MG  --  2.1  Recent Labs  10/26/13 1609  AST 16  ALT 11  ALKPHOS 78  BILITOT 0.3  PROT 7.4  ALBUMIN 3.7    Recent Labs  10/26/13 1609  WBC 7.9  NEUTROABS 5.6  HGB 13.8  HCT 40.7  MCV 92.3  PLT 216    Recent Labs  10/26/13 1609  TROPONINI <0.30   Radiological Exams on Admission: Dg Skull 1-3 Views  10/26/2013   CLINICAL DATA:  Screening for MRI of performance  EXAM: SKULL - 1-3 VIEW  COMPARISON:  None.  FINDINGS: No acute bony lesion is noted.  No radiopaque foreign body is seen.   Electronically Signed   By: Inez Catalina M.D.   On: 10/26/2013 19:14   Dg Chest 2 View  10/26/2013   CLINICAL DATA:  Chest pain.  EXAM: CHEST  2 VIEW  COMPARISON:  08/04/2013  FINDINGS: Two views of the chest were obtained. Again noted are prominent lung markings in the lower lungs. There is no focal airspace  disease or overt pulmonary edema. Atherosclerotic calcifications in the aortic arch. Bony structures are intact. Heart size is within normal limits.  IMPRESSION: No acute cardiopulmonary disease.  Coarse lung markings in the lower chest appear chronic.   Electronically Signed   By: Markus Daft M.D.   On: 10/26/2013 16:11   Mr Brain Wo Contrast  10/26/2013   CLINICAL DATA:  Two syncopal episodes with dizziness, weakness, difficulty speaking, and confusion. Remote history of brain surgery. History of breast cancer, uterine cancer, and thyroid cancer.  EXAM: MRI HEAD WITHOUT CONTRAST  TECHNIQUE: Multiplanar, multiecho pulse sequences of the brain and surrounding structures were obtained without intravenous contrast.  COMPARISON:  None.  FINDINGS: There is no acute infarct. Ventricles and sulci are within normal limits for age. There is no evidence of intracranial hemorrhage, mass, or midline shift. No extra-axial fluid collection. There are scattered, small foci of T2 hyperintensity within the subcortical and deep cerebral white matter bilaterally, nonspecific but compatible with minimal chronic small vessel ischemic disease.  Orbits are unremarkable. Paranasal sinuses and mastoid air cells are clear. Major intracranial vascular flow voids are preserved.  IMPRESSION: 1. No evidence of acute intracranial abnormality or mass. 2. Minimal chronic small vessel ischemic disease.   Electronically Signed   By: Logan Bores   On: 10/26/2013 20:14    Assessment/Plan 63 yo female with syncope likely from orthostatics with unctrolled migraine   Principal Problem:   Syncope-  Orthostatics nml here.  Give ivf.  If not better may benefit from holter monitor outpt.  Mri is neg.  Recent nml cardiac stress testing 12/14.  Will not repeat any cardiac w/u inpt, the only thing to add would be holter.  Neuro exam is normal.  Active Problems:   CAD (coronary artery disease)   Adult stuttering   UTI (lower urinary tract  infection) rocephin   Migraine headache  Give dose of imitrex  Obs.  FULL CODE>  Shamon Cothran A 10/26/2013, 11:08 PM

## 2013-10-26 NOTE — ED Provider Notes (Signed)
CSN: [id number]     Arrival date & time 10/26/13  1534 History   First MD Initiated Contact with Patient 10/26/13 1747     Chief Complaint  Patient presents with  . Chest Pain     HPI Pt was seen at 1745. Per pt and her family, c/o sudden onset and resolution of two episodes of syncope for the past 2 days. Pt states he "was just walking in a store" 2 days ago when she "woke up on the floor." Pt states the syncope reoccurred yesterday when she was walking down her hallway at home. States she felt "lightheaded" before both of the syncopal episodes. Pt's family states pt has been "stuttering" all day today. LKW last night; both pt and her family are unsure when the stuttering started. Pt also c/o gradual onset and persistence of constant acute flair of her chronic left sided chest "pain" for the past 3 months. States the CP has been constantly present since December 2014. Describes the pain as "sharp." States the pain worsens with movement of her torso and left arm. Pt was admitted in 07/2013 for same, dx with atypical chest pain after normal Lexiscan, negative serial troponins. Per pt, c/o gradual onset and persistence of constant acute flair of her chronic headache for the past 2 months.  Describes the headache as per her usual chronic headache pain pattern for many years. Pt describes the headache as "dull" and "aching," located at her vertex scalp. She has not taken any meds to treat her headache. Denies headache was sudden or maximal in onset or at any time.  Denies visual changes, no focal motor weakness, no tingling/numbness in extremities, no fevers, no neck pain, no rash.      Past Medical History  Diagnosis Date  . Hypertension   . CAD (coronary artery disease)   . History of breast cancer   . History of stress test 07/2013    normal  . Chronic headache   . GERD (gastroesophageal reflux disease)    Past Surgical History  Procedure Laterality Date  . Cesarean section    . Breast surgery       due to Cancer  . Throat surgery    . Brain surgery      History  Substance Use Topics  . Smoking status: Current Every Day Smoker -- 0.50 packs/day    Types: Cigarettes  . Smokeless tobacco: Not on file  . Alcohol Use: No    Review of Systems ROS: Statement: All systems negative except as marked or noted in the HPI; Constitutional: Negative for fever and chills. ; ; Eyes: Negative for eye pain, redness and discharge. ; ; ENMT: Negative for ear pain, hoarseness, nasal congestion, sinus pressure and sore throat. ; ; Cardiovascular: +CP. Negative for palpitations, diaphoresis, dyspnea and peripheral edema. ; ; Respiratory: Negative for cough, wheezing and stridor. ; ; Gastrointestinal: Negative for nausea, vomiting, diarrhea, abdominal pain, blood in stool, hematemesis, jaundice and rectal bleeding. . ; ; Genitourinary: Negative for dysuria, flank pain and hematuria. ; ; Musculoskeletal: Negative for back pain and neck pain. Negative for swelling and trauma.; ; Skin: Negative for pruritus, rash, abrasions, blisters, bruising and skin lesion.; ; Neuro: +chronic headache, lightheadedness, syncope, "stuttering speech." Negative for neck stiffness. Negative for extremity weakness, paresthesias, involuntary movement, seizure.     Allergies  Review of patient's allergies indicates no known allergies.  Home Medications   Current Outpatient Rx  Name  Route  Sig  Dispense  Refill  .  amLODipine (NORVASC) 5 MG tablet   Oral   Take 1 tablet (5 mg total) by mouth daily.   30 tablet   1   . aspirin 325 MG tablet   Oral   Take 650 mg by mouth daily as needed for mild pain, moderate pain or headache.         . nitroGLYCERIN (NITROSTAT) 0.4 MG SL tablet   Sublingual   Place 0.4 mg under the tongue every 5 (five) minutes as needed for chest pain.          BP 153/75  Pulse 83  Temp(Src) 97.8 F (36.6 C) (Oral)  Resp 16  Ht 5\' 3"  (1.6 m)  Wt 162 lb (73.483 kg)  BMI 28.70 kg/m2  SpO2  98% Physical Exam 1750: Physical examination:  Nursing notes reviewed; Vital signs and O2 SAT reviewed;  Constitutional: Well developed, Well nourished, Well hydrated, In no acute distress; Head:  Normocephalic, atraumatic; Eyes: EOMI, PERRL, No scleral icterus; ENMT: Mouth and pharynx normal, Mucous membranes moist; Neck: Supple, Full range of motion, No lymphadenopathy; Cardiovascular: Regular rate and rhythm, No gallop; Respiratory: Breath sounds clear & equal bilaterally, No wheezes.  Speaking full sentences with ease, Normal respiratory effort/excursion; Chest: Nontender, Movement normal; Abdomen: Soft, Nontender, Nondistended, Normal bowel sounds; Genitourinary: No CVA tenderness; Extremities: Pulses normal, No tenderness, No edema, No calf edema or asymmetry.; Neuro: AA&Ox3, Major CN grossly intact. Speech clear, then will briefly stutter a word. No facial droop.  No nystagmus. Grips equal. Strength 5/5 equal bilat UE's and LE's.  DTR 2/4 equal bilat UE's and LE's.  No gross sensory deficits.  Normal cerebellar testing bilat UE's (finger-nose) and LE's (heel-shin).; Skin: Color normal, Warm, Dry.    ED Course  Procedures     EKG Interpretation   Date/Time:  Friday October 26 2013 15:40:05 EST Ventricular Rate:  94 PR Interval:  126 QRS Duration: 68 QT Interval:  336 QTC Calculation: 420 R Axis:   12 Text Interpretation:  Normal sinus rhythm Baseline wander Normal ECG When  compared with ECG of 17-Sep-2013 18:20, No significant change was found  Confirmed by [facility]  MD, Nunzio Cory [phone]) on 10/26/2013 6:38:31 PM      MDM  MDM Reviewed: previous chart, nursing note and vitals Reviewed previous: labs and ECG Interpretation: labs, ECG, x-ray and MRI   Results for orders placed during the hospital encounter of 10/26/13  CBC WITH DIFFERENTIAL      Result Value Ref Range   WBC 7.9  4.0 - 10.5 K/uL   RBC 4.41  3.87 - 5.11 MIL/uL   Hemoglobin 13.8  12.0 - 15.0 g/dL   HCT 40.7  36.0  - 46.0 %   MCV 92.3  78.0 - 100.0 fL   MCH 31.3  26.0 - 34.0 pg   MCHC 33.9  30.0 - 36.0 g/dL   RDW 14.1  11.5 - 15.5 %   Platelets 216  150 - 400 K/uL   Neutrophils Relative % 71  43 - 77 %   Neutro Abs 5.6  1.7 - 7.7 K/uL   Lymphocytes Relative 20  12 - 46 %   Lymphs Abs 1.6  0.7 - 4.0 K/uL   Monocytes Relative 8  3 - 12 %   Monocytes Absolute 0.6  0.1 - 1.0 K/uL   Eosinophils Relative 1  0 - 5 %   Eosinophils Absolute 0.1  0.0 - 0.7 K/uL   Basophils Relative 0  0 - 1 %  Basophils Absolute 0.0  0.0 - 0.1 K/uL  COMPREHENSIVE METABOLIC PANEL      Result Value Ref Range   Sodium 141  137 - 147 mEq/L   Potassium 3.7  3.7 - 5.3 mEq/L   Chloride 103  96 - 112 mEq/L   CO2 26  19 - 32 mEq/L   Glucose, Bld 100 (*) 70 - 99 mg/dL   BUN 4 (*) 6 - 23 mg/dL   Creatinine, Ser 0.67  0.50 - 1.10 mg/dL   Calcium 9.0  8.4 - 10.5 mg/dL   Total Protein 7.4  6.0 - 8.3 g/dL   Albumin 3.7  3.5 - 5.2 g/dL   AST 16  0 - 37 U/L   ALT 11  0 - 35 U/L   Alkaline Phosphatase 78  39 - 117 U/L   Total Bilirubin 0.3  0.3 - 1.2 mg/dL   GFR calc non Af Amer >90  >90 mL/min   GFR calc Af Amer >90  >90 mL/min  TROPONIN I      Result Value Ref Range   Troponin I <0.30  <0.30 ng/mL  URINALYSIS, ROUTINE W REFLEX MICROSCOPIC      Result Value Ref Range   Color, Urine YELLOW  YELLOW   APPearance CLOUDY (*) CLEAR   Specific Gravity, Urine <1.005 (*) 1.005 - 1.030   pH 7.0  5.0 - 8.0   Glucose, UA NEGATIVE  NEGATIVE mg/dL   Hgb urine dipstick TRACE (*) NEGATIVE   Bilirubin Urine NEGATIVE  NEGATIVE   Ketones, ur NEGATIVE  NEGATIVE mg/dL   Protein, ur NEGATIVE  NEGATIVE mg/dL   Urobilinogen, UA 0.2  0.0 - 1.0 mg/dL   Nitrite POSITIVE (*) NEGATIVE   Leukocytes, UA SMALL (*) NEGATIVE  MAGNESIUM      Result Value Ref Range   Magnesium 2.1  1.5 - 2.5 mg/dL  URINE MICROSCOPIC-ADD ON      Result Value Ref Range   Squamous Epithelial / LPF RARE  RARE   WBC, UA 11-20  <3 WBC/hpf   Bacteria, UA MANY (*) RARE    Dg Skull 1-3 Views 10/26/2013   CLINICAL DATA:  Screening for MRI of performance  EXAM: SKULL - 1-3 VIEW  COMPARISON:  None.  FINDINGS: No acute bony lesion is noted.  No radiopaque foreign body is seen.   Electronically Signed   By: Inez Catalina M.D.   On: 10/26/2013 19:14   Dg Chest 2 View 10/26/2013   CLINICAL DATA:  Chest pain.  EXAM: CHEST  2 VIEW  COMPARISON:  08/04/2013  FINDINGS: Two views of the chest were obtained. Again noted are prominent lung markings in the lower lungs. There is no focal airspace disease or overt pulmonary edema. Atherosclerotic calcifications in the aortic arch. Bony structures are intact. Heart size is within normal limits.  IMPRESSION: No acute cardiopulmonary disease.  Coarse lung markings in the lower chest appear chronic.   Electronically Signed   By: Markus Daft M.D.   On: 10/26/2013 16:11   Mr Brain Wo Contrast 10/26/2013   CLINICAL DATA:  Two syncopal episodes with dizziness, weakness, difficulty speaking, and confusion. Remote history of brain surgery. History of breast cancer, uterine cancer, and thyroid cancer.  EXAM: MRI HEAD WITHOUT CONTRAST  TECHNIQUE: Multiplanar, multiecho pulse sequences of the brain and surrounding structures were obtained without intravenous contrast.  COMPARISON:  None.  FINDINGS: There is no acute infarct. Ventricles and sulci are within normal limits for age. There is no  evidence of intracranial hemorrhage, mass, or midline shift. No extra-axial fluid collection. There are scattered, small foci of T2 hyperintensity within the subcortical and deep cerebral white matter bilaterally, nonspecific but compatible with minimal chronic small vessel ischemic disease.  Orbits are unremarkable. Paranasal sinuses and mastoid air cells are clear. Major intracranial vascular flow voids are preserved.  IMPRESSION: 1. No evidence of acute intracranial abnormality or mass. 2. Minimal chronic small vessel ischemic disease.   Electronically Signed   By: Logan Bores   On: 10/26/2013 20:14    2040:  Doubt PE as cause for symptoms with low risk Wells.  Doubt ACS as cause for symptoms with normal troponin and unchanged EKG from previous after 2 to 3 months of constant symptoms. Pt is not orthostatic on VS. +UTI, UC pending; will tx with IV rocephin. Headache treated with pain meds and she is now no longer "stuttering" as I speak with her. Dx and testing d/w pt and family.  Questions answered.  Verb understanding, agreeable to observation admit.  T/C to [facility] Dr. Shanon Brow, case discussed, including:  HPI, pertinent PM/SHx, VS/PE, dx testing, ED course and treatment:  Agreeable to observation admit, requests to write temporary orders, obtain tele bed.     Alfonzo Feller, DO 10/29/13 1502

## 2013-10-27 DIAGNOSIS — G43909 Migraine, unspecified, not intractable, without status migrainosus: Secondary | ICD-10-CM

## 2013-10-27 MED ORDER — ACETAMINOPHEN 325 MG PO TABS
650.0000 mg | ORAL_TABLET | Freq: Four times a day (QID) | ORAL | Status: DC | PRN
  Administered 2013-10-27: 650 mg via ORAL
  Filled 2013-10-27: qty 2

## 2013-10-27 MED ORDER — CEFUROXIME AXETIL 250 MG PO TABS: 500.0000 mg | ORAL_TABLET | Freq: Two times a day (BID) | ORAL | Status: DC

## 2013-10-27 MED ORDER — CEFUROXIME AXETIL 500 MG PO TABS: 500.0000 mg | ORAL_TABLET | Freq: Two times a day (BID) | ORAL | Status: DC

## 2013-10-27 NOTE — Progress Notes (Signed)
PROGRESS NOTE  Dawn Mckay [id number] DOB: 11/24/1950 DOA: 10/26/2013 PCP: No PCP Per Patient  Summary: 63 year old woman with history of migraine headaches, coronary artery disease, hypertension presented with history of syncope x2 as well as dizziness upon standing.  Assessment/Plan: 1. Syncope x2 in 48 hours prior to admission. Suspect secondary to initiation of Norvasc and relative hypotension or vasovagal effect. Low risk Wells. No evidence of ACS. EKG nonacute. Telemetry unremarkable. MRI brain unremarkable. No orthostasis.  2. UTI. Treat empirically, no culture was obtained. 3. Constant chest pain since 07/2013, worse with movement, had normal Lexiscan 07/2013. Troponin was negative. EKG not acute. No further evaluation suggested. 4. Chronic headache for greater than one week. History of headaches. No focal neurologic findings. MRI unremarkable. 5. Hypertension. Stable. 6. History coronary artery disease. Admitted for chest pain 07/2013. CT angiogram of the chest was negative for PE. Lexiscan was negative for ischemia. Also had negative Cardiolite 10/2001. 7. Tobacco dependence. Recommend cessation.   Patient demanding to be discharged home. Her workup here has been unremarkable, etiology syncope unclear but suspect vasovagal or from Norvasc recently started. Do not see any compelling reasons to insist on further hospitalization, but will recommend outpatient followup with her PCP at [facility] in [place] in 1 week, consider TSH, echo, event monitor.  Murray Hodgkins, MD  [facility]  Pager [phone] If 7PM-7AM, please contact night-coverage at [email or web address], password [facility] 10/27/2013, 10:46 AM  LOS: 1 day   Consultants:    Procedures:    Antibiotics:  Ceftriaxone 3/6  Augmentin 3/7 >> 3/8   HPI/Subjective: She complains of a headache which has been present for at least one week. She has a history of migraines. She also notes chronic chest pain which has been  present since December of 2014. No vomiting. No dizziness reported. She correlates the onset of her dizziness and syncope with the initiation of Norvasc. She feels much better and would like to go home.  Objective: Filed Vitals:   10/26/13 2241 10/27/13 0654 10/27/13 0655 10/27/13 0656  BP: 131/83 109/59 134/60 117/64  Pulse: 69 68 67 79  Temp: 97.3 F (36.3 C) 97.7 F (36.5 C)    TempSrc: Oral Oral    Resp: 16 16 16 16   Height: 5\' 3"  (1.6 m)     Weight: 77.474 kg (170 lb 12.8 oz)     SpO2: 97% 96% 98% 97%    Intake/Output Summary (Last 24 hours) at 10/27/13 1046 Last data filed at 10/27/13 0900  Gross per 24 hour  Intake    100 ml  Output      0 ml  Net    100 ml     Filed Weights   10/26/13 1542 10/26/13 2241  Weight: 73.483 kg (162 lb) 77.474 kg (170 lb 12.8 oz)    Exam:   Afebrile, vital signs are stable. No hypoxia  Appears calm and comfortable. Speech fluent and clear.  Cardiovascular regular rate and rhythm. No murmur, rub or gallop. No lower extremity edema.  Telemetry sinus rhythm/sinus bradycardia.  Respiratory clear to auscultation bilaterally. No wheezes, rales or rhonchi. Normal respiratory effort.  Abdomen soft nontender and nondistended.  Neurologic cranial nerves 2-12 intact.  Musculoskeletal grossly normal tone and strength all extremities.  Data Reviewed:  On admission basic metabolic panel, magnesium, hepatic function panel, troponin, CBC unremarkable.  Urinalysis grossly positive.   Chest x-ray no acute disease.  EKG showed normal sinus rhythm. No acute changes.  MRI brain no evidence of acute abnormality  or mass.  Scheduled Meds: . sodium chloride   Intravenous STAT  . amLODipine  5 mg Oral Daily  . cefTRIAXone (ROCEPHIN)  IV  1 g Intravenous Q24H  . sodium chloride  3 mL Intravenous Q12H   Continuous Infusions: . sodium chloride 100 mL/hr at 10/27/13 0010    Principal Problem:   Syncope Active Problems:   CAD (coronary  artery disease)   Adult stuttering   UTI (lower urinary tract infection)   Migraine headache

## 2013-10-27 NOTE — Progress Notes (Signed)
Pt very dissatisfied with pain medication offered to her.  MD only ordered tylenol for her headache until he was able to examine the patient.  Explained this to patient and she continued to complain about the noise in the hospital, the fact that tylenol will not help her and that it was "BS" that we were giving her that for her headache, and complaining about the food.  Explained to pt that tylenol was what was ordered until the physician was able to examine her.  Pt still not happy.

## 2013-10-27 NOTE — Progress Notes (Signed)
Pt educated concerning discharge instructions.  Pt given one prescription and IV removed.  Pt escorted to the main entrance by NT and discharged home with daughter.

## 2013-10-27 NOTE — Progress Notes (Signed)
UR completed 

## 2013-10-27 NOTE — Discharge Summary (Signed)
Physician Discharge Summary  Dawn Mckay RWE:[id number] DOB: 18-Oct-1950 DOA: 10/26/2013  PCP: No PCP Per Patient [facility]  Admit date: 10/26/2013 Discharge date: 10/27/2013  Recommendations for Outpatient Follow-up:  1. Syncope. Consider TSH, echocardiogram. Consideration could be given to him on monitor although no arrhythmias were demonstrated and history is not particularly suspicious for this.  2. UTI resolution. 3. Chronic headaches and chronic chest pain. 4. Continue to recommend smoking cessation.  Discharge Diagnoses:  1. Syncope 2. UTI 3. Chronic chest pain 4. Chronic headache 5. Tobacco dependence  Discharge Condition: Improved Disposition: Home  Diet recommendation: Heart healthy  Filed Weights   10/26/13 1542 10/26/13 2241  Weight: 73.483 kg (162 lb) 77.474 kg (170 lb 12.8 oz)    History of present illness:  63 year old woman with history of migraine headaches, coronary artery disease, hypertension presented with history of syncope x2 as well as dizziness upon standing.  Hospital Course:  Patient was observed overnight. She feels much better today, chronic headache persists without change but she insists upon going home. No focal deficits. Evaluation are unremarkable she is felt to be stable for discharge. See individual issues below.  1. Syncope x2 in 48 hours prior to admission. Suspect secondary to initiation of Norvasc and relative hypotension or vasovagal effect. Low risk [place]. No evidence of ACS. EKG nonacute. Telemetry unremarkable. MRI brain unremarkable. No orthostasis.  2. UTI. Treat empirically, no culture was obtained. 3. Constant chest pain since 07/2013, worse with movement, had normal Lexiscan 07/2013. Troponin was negative. EKG not acute. No further evaluation suggested. 4. Chronic headache for greater than one week. History of headaches. No focal neurologic findings. MRI unremarkable. 5. Hypertension. Stable. 6. History coronary artery  disease. Admitted for chest pain 07/2013. CT angiogram of the chest was negative for PE. Lexiscan was negative for ischemia. Also had negative Cardiolite 10/2001. 7. Tobacco dependence. Recommend cessation. Patient demanding to be discharged home. Her workup here has been unremarkable, etiology syncope unclear but suspect vasovagal or from Norvasc recently started. Do not see any compelling reasons to insist on further hospitalization, but will recommend outpatient followup with her PCP at [facility] in [place] in 1 week, consider TSH, echo, event monitor.  Discharge Instructions  Discharge Orders   Future Orders Complete By Expires   Diet - low sodium heart healthy  As directed    Discharge instructions  As directed    Comments:     Make an appointment followup with your primary care physician in one week. Your primary care physician can call [phone] to obtain medical records. Call your physician or seek immediate medical assistance for passing out or worsening of symptoms.   Increase activity slowly  As directed        Medication List    STOP taking these medications       amLODipine 5 MG tablet  Commonly known as:  NORVASC      TAKE these medications       aspirin 325 MG tablet  Take 650 mg by mouth daily as needed for mild pain, moderate pain or headache.     cefUROXime 500 MG tablet  Commonly known as:  CEFTIN  Take 1 tablet (500 mg total) by mouth 2 (two) times daily with a meal.     nitroGLYCERIN 0.4 MG SL tablet  Commonly known as:  NITROSTAT  Place 0.4 mg under the tongue every 5 (five) minutes as needed for chest pain.       No Known Allergies  The results of significant diagnostics from this hospitalization (including imaging, microbiology, ancillary and laboratory) are listed below for reference.    Significant Diagnostic Studies: Dg Chest 2 View  10/26/2013   CLINICAL DATA:  Chest pain.  EXAM: CHEST  2 VIEW  COMPARISON:  08/04/2013  FINDINGS: Two views of the  chest were obtained. Again noted are prominent lung markings in the lower lungs. There is no focal airspace disease or overt pulmonary edema. Atherosclerotic calcifications in the aortic arch. Bony structures are intact. Heart size is within normal limits.  IMPRESSION: No acute cardiopulmonary disease.  Coarse lung markings in the lower chest appear chronic.   Electronically Signed   By: Markus Daft M.D.   On: 10/26/2013 16:11   Mr Brain Wo Contrast  10/26/2013   CLINICAL DATA:  Two syncopal episodes with dizziness, weakness, difficulty speaking, and confusion. Remote history of brain surgery. History of breast cancer, uterine cancer, and thyroid cancer.  EXAM: MRI HEAD WITHOUT CONTRAST  TECHNIQUE: Multiplanar, multiecho pulse sequences of the brain and surrounding structures were obtained without intravenous contrast.  COMPARISON:  None.  FINDINGS: There is no acute infarct. Ventricles and sulci are within normal limits for age. There is no evidence of intracranial hemorrhage, mass, or midline shift. No extra-axial fluid collection. There are scattered, small foci of T2 hyperintensity within the subcortical and deep cerebral white matter bilaterally, nonspecific but compatible with minimal chronic small vessel ischemic disease.  Orbits are unremarkable. Paranasal sinuses and mastoid air cells are clear. Major intracranial vascular flow voids are preserved.  IMPRESSION: 1. No evidence of acute intracranial abnormality or mass. 2. Minimal chronic small vessel ischemic disease.   Electronically Signed   By: Logan Bores   On: 10/26/2013 20:14    Labs: Basic Metabolic Panel:  Recent Labs Lab 10/26/13 1609 10/26/13 1813  NA 141  --   K 3.7  --   CL 103  --   CO2 26  --   GLUCOSE 100*  --   BUN 4*  --   CREATININE 0.67  --   CALCIUM 9.0  --   MG  --  2.1   Liver Function Tests:  Recent Labs Lab 10/26/13 1609  AST 16  ALT 11  ALKPHOS 78  BILITOT 0.3  PROT 7.4  ALBUMIN 3.7   CBC:  Recent  Labs Lab 10/26/13 1609  WBC 7.9  NEUTROABS 5.6  HGB 13.8  HCT 40.7  MCV 92.3  PLT 216   Cardiac Enzymes:  Recent Labs Lab 10/26/13 1609  TROPONINI <0.30   CBG: No results found for this basename: GLUCAP,  in the last 168 hours  Principal Problem:   Syncope Active Problems:   CAD (coronary artery disease)   Adult stuttering   UTI (lower urinary tract infection)   Migraine headache   Time coordinating discharge:  35 minutes  Signed:  Murray Hodgkins, MD [facility] 10/27/2013, 3:18 PM

## 2013-11-12 ENCOUNTER — Emergency Department (HOSPITAL_COMMUNITY): Payer: Self-pay

## 2013-11-12 ENCOUNTER — Encounter (HOSPITAL_COMMUNITY): Payer: Self-pay | Admitting: Emergency Medicine

## 2013-11-12 ENCOUNTER — Emergency Department (HOSPITAL_COMMUNITY)
Admission: EM | Admit: 2013-11-12 | Discharge: 2013-11-13 | Disposition: A | Discharge status: Home / Self Care | Payer: Self-pay | Attending: Emergency Medicine | Admitting: Emergency Medicine

## 2013-11-12 DIAGNOSIS — R0602 Shortness of breath: Secondary | ICD-10-CM | POA: Insufficient documentation

## 2013-11-12 DIAGNOSIS — R079 Chest pain, unspecified: Secondary | ICD-10-CM

## 2013-11-12 DIAGNOSIS — Z853 Personal history of malignant neoplasm of breast: Secondary | ICD-10-CM | POA: Insufficient documentation

## 2013-11-12 DIAGNOSIS — F17200 Nicotine dependence, unspecified, uncomplicated: Secondary | ICD-10-CM | POA: Insufficient documentation

## 2013-11-12 DIAGNOSIS — R209 Unspecified disturbances of skin sensation: Secondary | ICD-10-CM | POA: Insufficient documentation

## 2013-11-12 DIAGNOSIS — Z8719 Personal history of other diseases of the digestive system: Secondary | ICD-10-CM | POA: Insufficient documentation

## 2013-11-12 DIAGNOSIS — I2510 Atherosclerotic heart disease of native coronary artery without angina pectoris: Secondary | ICD-10-CM | POA: Insufficient documentation

## 2013-11-12 DIAGNOSIS — I10 Essential (primary) hypertension: Secondary | ICD-10-CM | POA: Insufficient documentation

## 2013-11-12 DIAGNOSIS — Z792 Long term (current) use of antibiotics: Secondary | ICD-10-CM | POA: Insufficient documentation

## 2013-11-12 DIAGNOSIS — R072 Precordial pain: Secondary | ICD-10-CM | POA: Insufficient documentation

## 2013-11-12 DIAGNOSIS — G8929 Other chronic pain: Secondary | ICD-10-CM | POA: Insufficient documentation

## 2013-11-12 MED ORDER — ONDANSETRON HCL 4 MG/2ML IJ SOLN
4.0000 mg | Freq: Once | INTRAMUSCULAR | Status: AC
  Administered 2013-11-12: 4 mg via INTRAVENOUS
  Filled 2013-11-12: qty 2

## 2013-11-12 MED ORDER — MORPHINE SULFATE 4 MG/ML IJ SOLN
4.0000 mg | Freq: Once | INTRAMUSCULAR | Status: AC
  Administered 2013-11-12: 4 mg via INTRAVENOUS
  Filled 2013-11-12: qty 1

## 2013-11-12 MED ORDER — NITROGLYCERIN 0.4 MG SL SUBL: 0.4000 mg | SUBLINGUAL_TABLET | SUBLINGUAL | Status: DC | PRN

## 2013-11-12 NOTE — ED Notes (Addendum)
Pt to department via EMS. Pt reporting chest pain started about noon.  Pt took 3 nitro prior to EMS arrival. 2 additional nitro given by EMS. Reporting some numbness and tingling in left arm.  No nausea at present.

## 2013-11-12 NOTE — ED Provider Notes (Signed)
CSN: [id number]     Arrival date & time 11/12/13  2333 History  This chart was scribed for Teressa Lower, MD by Maree Erie, ED Scribe. The patient was seen in room APA16A/APA16A. Patient's care was started at 11:36 PM.     Chief Complaint  Patient presents with  . Chest Pain      Patient is a 63 y.o. female presenting with chest pain.  Chest Pain Pain location:  Substernal area Pain radiates to:  Does not radiate Pain severity:  Moderate Duration:  12 hours Timing:  Constant Progression:  Unchanged Chronicity:  Recurrent Relieved by:  Nitroglycerin and aspirin Associated symptoms: numbness   Associated symptoms: no cough, no fever, no heartburn and not vomiting     HPI Comments: Dawn Mckay is a 63 y.o. female brought in by ambulance who presents to the Emergency Department complaining of substernal chest pain that began about twelve hours ago. The pain does not radiate. She reports associated shortness of breath and numbness and tingling in her left arm. She reports taking three Nitroglycerin at home that brought the pain from a 10/10 in severity to a 6/10 in severity, which is where it is at currently. She also took one 325 mg aspirin prior to arrival. She denies known aggravating or alleviating factors. She denies fever, cough, leg swelling, leg pain or belching. She reports prior similar pain that was diagnosed as CAD. However, she also states she has frequent, chronic chest pain tat feels similar to the episode today. Her last stress test was in 07/2013 and was normal. She has a past medical history of MI in 2000 and HTN. She smokes 4-5 cigarettes per day.   She denies having a Film/video editor. She goes to the health department for primary care.   Past Medical History  Diagnosis Date  . Hypertension   . CAD (coronary artery disease)   . History of breast cancer   . History of stress test 07/2013    normal  . Chronic headache   . GERD (gastroesophageal reflux  disease)    Past Surgical History  Procedure Laterality Date  . Cesarean section    . Breast surgery      due to Cancer  . Throat surgery    . Brain surgery     No family history on file. History  Substance Use Topics  . Smoking status: Current Every Day Smoker -- 0.25 packs/day    Types: Cigarettes  . Smokeless tobacco: Not on file  . Alcohol Use: No   OB History   Grav Para Term Preterm Abortions TAB SAB Ect Mult Living                 Review of Systems  Constitutional: Negative for fever.  Respiratory: Negative for cough.   Cardiovascular: Positive for chest pain. Negative for leg swelling.  Gastrointestinal: Negative for heartburn and vomiting.  Musculoskeletal: Negative for myalgias.  Neurological: Positive for numbness.  All other systems reviewed and are negative.      Allergies  Review of patient's allergies indicates no known allergies.  Home Medications   Current Outpatient Rx  Name  Route  Sig  Dispense  Refill  . aspirin 325 MG tablet   Oral   Take 650 mg by mouth daily as needed for mild pain, moderate pain or headache.         . cefUROXime (CEFTIN) 500 MG tablet   Oral   Take 1 tablet (500 mg total) by  mouth 2 (two) times daily with a meal.   4 tablet   0   . nitroGLYCERIN (NITROSTAT) 0.4 MG SL tablet   Sublingual   Place 0.4 mg under the tongue every 5 (five) minutes as needed for chest pain.          Triage Vitals: BP 149/66  Pulse 70  Temp(Src) 97.7 F (36.5 C) (Oral)  Resp 19  Ht 5\' 3"  (1.6 m)  Wt 157 lb (71.215 kg)  BMI 27.82 kg/m2  SpO2 95%  Physical Exam  Constitutional: She is oriented to person, place, and time. She appears well-developed and well-nourished. No distress.  HENT:  Head: Normocephalic and atraumatic.  Nose: Nose normal.  Mouth/Throat: Oropharynx is clear and moist. No oropharyngeal exudate.  Eyes: EOM are normal. Pupils are equal, round, and reactive to light.  Neck: Normal range of motion. Neck  supple.  Cardiovascular: Normal rate and regular rhythm.   Pulmonary/Chest: Effort normal and breath sounds normal. No respiratory distress. She has no rales.  Abdominal: Soft. She exhibits no distension. There is no tenderness. There is no rebound and no guarding.  Musculoskeletal:  Calves are non tender.  Lymphadenopathy:    She has no cervical adenopathy.  Neurological: She is alert and oriented to person, place, and time.  Skin: Skin is warm and dry.  Psychiatric: She has a normal mood and affect.    ED Course  Procedures (including critical care time)  DIAGNOSTIC STUDIES: Oxygen Saturation is 95% on [place], adequate by my interpretation.    COORDINATION OF CARE: 11:43 PM -  EMS strips reviewed. Sinus rhythm  11:44 PM -Will order CBC, BMP, EKG and Troponin I. Patient verbalizes understanding and agrees with treatment plan.    Labs Review Labs Reviewed  BASIC METABOLIC PANEL - Abnormal; Notable for the following:    Glucose, Bld 120 (*)    GFR calc non Af Amer 70 (*)    GFR calc Af Amer 81 (*)    All other components within normal limits  CBC  TROPONIN I  I-STAT TROPOININ, ED   Imaging Review Dg Chest Portable 1 View  11/13/2013   CLINICAL DATA:  Left-sided chest pain radiating into the left upper back.  EXAM: PORTABLE CHEST - 1 VIEW  COMPARISON:  10/26/2013  FINDINGS: The cardiomediastinal silhouette is within normal limits. The lungs are well inflated. There is new pulmonary vascular congestion with mildly increased interstitial markings in both lungs, greatest in the bases. No pleural effusion or pneumothorax is identified. No acute osseous abnormality is seen.  IMPRESSION: Mild pulmonary edema.   Electronically Signed   By: Logan Bores   On: 11/13/2013 00:04     EKG Interpretation   Date/Time:  Monday November 12 2013 23:43:18 EDT Ventricular Rate:  70 PR Interval:  132 QRS Duration: 68 QT Interval:  384 QTC Calculation: 414 R Axis:   22 Text Interpretation:   Normal sinus rhythm Normal ECG When compared with  ECG of 26-Oct-2013 15:40, No significant change was found Confirmed by  Adira Limburg  MD, Faren Florence ([id number]) on 11/12/2013 11:51:49 PM     Nitroglycerin IV morphine provided.  Chest pain resolved. Previous records reviewed in EMR, has history of chronic chest pain, last admission 10/26/2013 was noted to have constant chest pain since 07/2013, worse with movement, had normal Lexiscan 07/2013.   Plan discharge home with outpatient referral to cardiology. Patient agrees to strict return precautions. She will take aspirin daily and followup with the cardiologist.  MDM   Final diagnoses:  Chest pain   Evaluated with EKG, chest x-ray reviewed as above. Labs including serial troponins reviewed as above and within normal limits. Pain improved with medications as above. Presentation does not suggest ACS and patient is stable / appropriate for discharge at this time.   I personally performed the services described in this documentation, which was scribed in my presence. The recorded information has been reviewed and is accurate.    Teressa Lower, MD 11/14/13 [phone]

## 2013-11-13 LAB — CBC
HEMATOCRIT: 37.4 % (ref 36.0–46.0)
HEMOGLOBIN: 12.8 g/dL (ref 12.0–15.0)
MCH: 31 pg (ref 26.0–34.0)
MCHC: 34.2 g/dL (ref 30.0–36.0)
MCV: 90.6 fL (ref 78.0–100.0)
Platelets: 226 10*3/uL (ref 150–400)
RBC: 4.13 MIL/uL (ref 3.87–5.11)
RDW: 14.2 % (ref 11.5–15.5)
WBC: 7.7 10*3/uL (ref 4.0–10.5)

## 2013-11-13 LAB — BASIC METABOLIC PANEL
BUN: 7 mg/dL (ref 6–23)
CALCIUM: 9.2 mg/dL (ref 8.4–10.5)
CO2: 23 mEq/L (ref 19–32)
CREATININE: 0.88 mg/dL (ref 0.50–1.10)
Chloride: 104 mEq/L (ref 96–112)
GFR calc Af Amer: 81 mL/min — ABNORMAL LOW (ref >90)
GFR, EST NON AFRICAN AMERICAN: 70 mL/min — AB (ref >90)
GLUCOSE: 120 mg/dL — AB (ref 70–99)
Potassium: 3.8 mEq/L (ref 3.7–5.3)
SODIUM: 139 meq/L (ref 137–147)

## 2013-11-13 LAB — I-STAT TROPONIN, ED: Troponin i, poc: 0.01 ng/mL (ref 0.00–0.08)

## 2013-11-13 LAB — TROPONIN I: Troponin I: <0.3 ng/mL (ref <0.30)

## 2013-11-13 MED ORDER — MORPHINE SULFATE 4 MG/ML IJ SOLN
INTRAMUSCULAR | Status: AC
  Filled 2013-11-13: qty 1

## 2013-11-13 MED ORDER — MORPHINE SULFATE 4 MG/ML IJ SOLN
4.0000 mg | Freq: Once | INTRAMUSCULAR | Status: AC
  Administered 2013-11-13: 4 mg via INTRAVENOUS

## 2013-11-13 NOTE — Discharge Instructions (Signed)
Chest Pain (Nonspecific) °It is often hard to give a specific diagnosis for the cause of chest pain. There is always a chance that your pain could be related to something serious, such as a heart attack or a blood clot in the lungs. You need to follow up with your caregiver for further evaluation. °CAUSES  °· Heartburn. °· Pneumonia or bronchitis. °· Anxiety or stress. °· Inflammation around your heart (pericarditis) or lung (pleuritis or pleurisy). °· A blood clot in the lung. °· A collapsed lung (pneumothorax). It can develop suddenly on its own (spontaneous pneumothorax) or from injury (trauma) to the chest. °· Shingles infection (herpes zoster virus). °The chest wall is composed of bones, muscles, and cartilage. Any of these can be the source of the pain. °· The bones can be bruised by injury. °· The muscles or cartilage can be strained by coughing or overwork. °· The cartilage can be affected by inflammation and become sore (costochondritis). °DIAGNOSIS  °Lab tests or other studies, such as X-rays, electrocardiography, stress testing, or cardiac imaging, may be needed to find the cause of your pain.  °TREATMENT  °· Treatment depends on what may be causing your chest pain. Treatment may include: °· Acid blockers for heartburn. °· Anti-inflammatory medicine. °· Pain medicine for inflammatory conditions. °· Antibiotics if an infection is present. °· You may be advised to change lifestyle habits. This includes stopping smoking and avoiding alcohol, caffeine, and chocolate. °· You may be advised to keep your head raised (elevated) when sleeping. This reduces the chance of acid going backward from your stomach into your esophagus. °· Most of the time, nonspecific chest pain will improve within 2 to 3 days with rest and mild pain medicine. °HOME CARE INSTRUCTIONS  °· If antibiotics were prescribed, take your antibiotics as directed. Finish them even if you start to feel better. °· For the next few days, avoid physical  activities that bring on chest pain. Continue physical activities as directed. °· Do not smoke. °· Avoid drinking alcohol. °· Only take over-the-counter or prescription medicine for pain, discomfort, or fever as directed by your caregiver. °· Follow your caregiver's suggestions for further testing if your chest pain does not go away. °· Keep any follow-up appointments you made. If you do not go to an appointment, you could develop lasting (chronic) problems with pain. If there is any problem keeping an appointment, you must call to reschedule. °SEEK MEDICAL CARE IF:  °· You think you are having problems from the medicine you are taking. Read your medicine instructions carefully. °· Your chest pain does not go away, even after treatment. °· You develop a rash with blisters on your chest. °SEEK IMMEDIATE MEDICAL CARE IF:  °· You have increased chest pain or pain that spreads to your arm, neck, jaw, back, or abdomen. °· You develop shortness of breath, an increasing cough, or you are coughing up blood. °· You have severe back or abdominal pain, feel nauseous, or vomit. °· You develop severe weakness, fainting, or chills. °· You have a fever. °THIS IS AN EMERGENCY. Do not wait to see if the pain will go away. Get medical help at once. Call your local emergency services (911 in [place] Do not drive yourself to the hospital. °MAKE SURE YOU:  °· Understand these instructions. °· Will watch your condition. °· Will get help right away if you are not doing well or get worse. °Document Released: 05/19/2005 Document Revised: 11/01/2011 Document Reviewed: 03/14/2008 °[employer]® Patient Information ©2014 [employer]   [employer]. ° °

## 2013-11-16 ENCOUNTER — Other Ambulatory Visit: Payer: Self-pay

## 2013-11-16 ENCOUNTER — Emergency Department (HOSPITAL_COMMUNITY): Payer: Self-pay

## 2013-11-16 ENCOUNTER — Encounter (HOSPITAL_COMMUNITY): Payer: Self-pay | Admitting: Emergency Medicine

## 2013-11-16 ENCOUNTER — Emergency Department (HOSPITAL_COMMUNITY)
Admission: EM | Admit: 2013-11-16 | Discharge: 2013-11-16 | Disposition: A | Discharge status: Home / Self Care | Payer: Self-pay | Attending: Emergency Medicine | Admitting: Emergency Medicine

## 2013-11-16 DIAGNOSIS — Z79899 Other long term (current) drug therapy: Secondary | ICD-10-CM | POA: Insufficient documentation

## 2013-11-16 DIAGNOSIS — F419 Anxiety disorder, unspecified: Secondary | ICD-10-CM

## 2013-11-16 DIAGNOSIS — Z853 Personal history of malignant neoplasm of breast: Secondary | ICD-10-CM | POA: Insufficient documentation

## 2013-11-16 DIAGNOSIS — R071 Chest pain on breathing: Secondary | ICD-10-CM | POA: Insufficient documentation

## 2013-11-16 DIAGNOSIS — F411 Generalized anxiety disorder: Secondary | ICD-10-CM | POA: Insufficient documentation

## 2013-11-16 DIAGNOSIS — Z8719 Personal history of other diseases of the digestive system: Secondary | ICD-10-CM | POA: Insufficient documentation

## 2013-11-16 DIAGNOSIS — R0789 Other chest pain: Secondary | ICD-10-CM

## 2013-11-16 DIAGNOSIS — I10 Essential (primary) hypertension: Secondary | ICD-10-CM | POA: Insufficient documentation

## 2013-11-16 DIAGNOSIS — F17200 Nicotine dependence, unspecified, uncomplicated: Secondary | ICD-10-CM | POA: Insufficient documentation

## 2013-11-16 DIAGNOSIS — G8929 Other chronic pain: Secondary | ICD-10-CM | POA: Insufficient documentation

## 2013-11-16 DIAGNOSIS — I2510 Atherosclerotic heart disease of native coronary artery without angina pectoris: Secondary | ICD-10-CM | POA: Insufficient documentation

## 2013-11-16 HISTORY — DX: Other chronic pain: G89.29

## 2013-11-16 HISTORY — DX: Chest pain, unspecified: R07.9

## 2013-11-16 LAB — CBC WITH DIFFERENTIAL/PLATELET
BASOS ABS: 0 10*3/uL (ref 0.0–0.1)
BASOS PCT: 0 % (ref 0–1)
EOS PCT: 1 % (ref 0–5)
Eosinophils Absolute: 0.1 10*3/uL (ref 0.0–0.7)
HEMATOCRIT: 42.8 % (ref 36.0–46.0)
Hemoglobin: 14.5 g/dL (ref 12.0–15.0)
Lymphocytes Relative: 21 % (ref 12–46)
Lymphs Abs: 1.6 10*3/uL (ref 0.7–4.0)
MCH: 30.9 pg (ref 26.0–34.0)
MCHC: 33.9 g/dL (ref 30.0–36.0)
MCV: 91.1 fL (ref 78.0–100.0)
MONO ABS: 0.5 10*3/uL (ref 0.1–1.0)
Monocytes Relative: 7 % (ref 3–12)
NEUTROS ABS: 5.3 10*3/uL (ref 1.7–7.7)
Neutrophils Relative %: 71 % (ref 43–77)
Platelets: 235 10*3/uL (ref 150–400)
RBC: 4.7 MIL/uL (ref 3.87–5.11)
RDW: 14 % (ref 11.5–15.5)
WBC: 7.4 10*3/uL (ref 4.0–10.5)

## 2013-11-16 LAB — BASIC METABOLIC PANEL
BUN: 4 mg/dL — ABNORMAL LOW (ref 6–23)
CO2: 27 mEq/L (ref 19–32)
CREATININE: 0.64 mg/dL (ref 0.50–1.10)
Calcium: 9.6 mg/dL (ref 8.4–10.5)
Chloride: 101 mEq/L (ref 96–112)
Glucose, Bld: 114 mg/dL — ABNORMAL HIGH (ref 70–99)
Potassium: 3.4 mEq/L — ABNORMAL LOW (ref 3.7–5.3)
Sodium: 141 mEq/L (ref 137–147)

## 2013-11-16 LAB — TROPONIN I

## 2013-11-16 MED ORDER — POTASSIUM CHLORIDE CRYS ER 20 MEQ PO TBCR
40.0000 meq | EXTENDED_RELEASE_TABLET | Freq: Once | ORAL | Status: AC
  Administered 2013-11-16: 40 meq via ORAL
  Filled 2013-11-16: qty 2

## 2013-11-16 MED ORDER — ACETAMINOPHEN 325 MG PO TABS
650.0000 mg | ORAL_TABLET | Freq: Once | ORAL | Status: DC
  Filled 2013-11-16: qty 2

## 2013-11-16 MED ORDER — POTASSIUM CHLORIDE 20 MEQ/15ML (10%) PO LIQD
40.0000 meq | Freq: Once | ORAL | Status: DC
  Filled 2013-11-16: qty 30

## 2013-11-16 NOTE — ED Notes (Signed)
Patient reports went to health department for evaluation of high blood pressure today. Reports started having chest pain there, and nurse sent her to ED. Patient reports recurrent episodes of intermittent chest pain since December. Recently seen in ED multiple times for same. Reports has taken 3 nitro PTA with no relief. Also took 324mg  of ASA PTA.

## 2013-11-16 NOTE — Discharge Instructions (Signed)
°Emergency Department Resource Guide °1) Find a Doctor and Pay Out of Pocket °Although you won't have to find out who is covered by your insurance plan, it is a good idea to ask around and get recommendations. You will then need to call the office and see if the doctor you have chosen will accept you as a new patient and what types of options they offer for patients who are self-pay. Some doctors offer discounts or will set up payment plans for their patients who do not have insurance, but you will need to ask so you aren't surprised when you get to your appointment. ° °2) Contact Your Local Health Department °Not all health departments have doctors that can see patients for sick visits, but many do, so it is worth a call to see if yours does. If you don't know where your local health department is, you can check in your phone book. The CDC also has a tool to help you locate your state's health department, and many state websites also have listings of all of their local health departments. ° °3) Find a Walk-in Clinic °If your illness is not likely to be very severe or complicated, you may want to try a walk in clinic. These are popping up all over the country in pharmacies, drugstores, and shopping centers. They're usually staffed by nurse practitioners or physician assistants that have been trained to treat common illnesses and complaints. They're usually fairly quick and inexpensive. However, if you have serious medical issues or chronic medical problems, these are probably not your best option. ° °No Primary Care Doctor: °- Call [facility] at  832-8000 - they can help you locate a primary care doctor that  accepts your insurance, provides certain services, etc. °- Physician Referral Service- 1-800-533-3463 ° °Chronic Pain Problems: °Organization         Address  Phone   Notes  °[facility]  (336) 297-2271 Patients need to be referred by their primary care doctor.  ° °Medication  Assistance: °Organization         Address  Phone   Notes  °[place] [facility] [address] °[place], [place] [address] (336) 641-8030 --Must be a resident of [place] °-- Must have NO insurance coverage whatsoever (no Medicaid/ Medicare, etc.) °-- The pt. MUST have a primary care doctor that directs their care regularly and follows them in the community °  °[facility]  (866) 331-1348   °[employer]  (888) 892-1162   ° °Agencies that provide inexpensive medical care: °Organization         Address  Phone   Notes  °[facility]  (336) 832-8035   °[facility]    (336) 832-7272   °[facility] [address] °[place], [place] [address] (336) 832-4777   °[facility] [address], °[place] (336) 271-4999   °[facility]    (336) 373-0678   °[facility]    (336) 272-1050   °[facility] ° [address], [place] Phone:  (336) 832-4444, Fax:  (336) 832-4440 Hours of Operation:  9 am - 6 pm, M-F.  Also accepts Medicaid/Medicare and self-pay.  °[facility] ° [address], [place] Phone: (336) [phone], Fax: (336) [phone]. Hours of Operation:  8:30 am - 5:30 pm, M-F.  Also accepts Medicaid and self-pay.  °[facility] [place] [address], [place] Phone: (336) 878-6027   °[facility] [address], [place], [place] (336)723-1848, Ext. 123 Mondays & Thursdays: 7-9 AM.  First 15 patients are seen on a first come, first serve basis. °  ° °Medicaid-accepting [place] Providers: ° °Organization         Address  Phone   Notes  °[facility] [address], [place] (336) 641-2100 Also accepts self-pay patients.  °[facility] [address], [place] ° (336) 856-9996   °[facility] [address], [place]  (336) 288-8857   °[facility] [address] [place] [address], [place] (336) 299-7000   °Veita Bland [address], [place]  ° (336) 373-1557 Only accepts [place] Access Medicaid patients after they have their name applied to their card.  ° °Self-Pay (no insurance) in [place]: ° °Organization         Address  Phone   Notes  °Sickle Cell Patients, [facility] [address], [place] (336) 832-1970   °[facility] [address], [place] (336) 832-4400   °[facility] ° [address], [place] (336) 992-4800   °[facility]/Dr. Osei-Bonsu ° [address] [place] [address], [place] or [address], [place] (336) [phone] Phone number for both [place] and [place] locations is the same.  °[facility] [address], [place] (336) 299-0000   °[facility] [address] [place] [address], [place] or [address] (336) 852-7530 °(336) 878-2260   °[facility] [address], [place] (336) 350-1642, phone; (336) 294-5005, fax Sees patients 1st and 3rd Saturday of every month.  Must not qualify for public or private insurance (i.e. Medicaid, Medicare, [place] Health Choice, Veterans' Benefits) • Household income should be no more than 200% of the poverty level •The clinic cannot treat you if you are pregnant or think you are pregnant • Sexually transmitted diseases are not treated at the clinic.  ° ° °Dental Care: °Organization         Address  Phone  Notes  °[place] [facility] [address], [place] (336) 641-6152 Accepts children up to age 21 who are enrolled in Medicaid or [place] Health Choice; pregnant women with a Medicaid card; and children who have applied for Medicaid or [place] Health Choice, but were declined, whose parents can pay a reduced fee at time of service.  °[place]  [facility] [place]  [address], [place] (336) 641-7733 Accepts children up to age 21 who are enrolled in Medicaid or [place] Health Choice; pregnant women with a Medicaid card; and children who have applied for Medicaid or [place] Health Choice, but were declined, whose parents can pay a reduced fee at time of service.  °[facility] ° [address], [place] (336) 641-4533 Patients are seen by appointment only. Walk-ins are not accepted. [facility] will see patients 18 years of age and older. °Monday - Tuesday (8am-5pm) °Most Wednesdays (8:30-5pm) °$30 per visit, cash only  °[facility] ° [address], [place] (336) 641-4533 Patients are seen by appointment only. Walk-ins are not accepted. [facility] will see patients 18 years of age and older. °One   Wednesday Evening (Monthly: Volunteer Based).  $30 per visit, cash only  °[facility]  (919) 537-3737 for adults; Children under age 4, call Graduate Pediatric Dentistry at ([phone]) [phone]. Children aged 4-14, please call ([phone]) [phone] to request a pediatric application. ° Dental services are provided in all areas of dental care including fillings, crowns and bridges, complete and partial dentures, implants, gum treatment, root canals, and extractions. Preventive care is also provided. Treatment is provided to both adults and children. °Patients are selected via a lottery and there is often a waiting list. °  °[facility] [address], °[place] ° (336) 763-8833 [email or web address] °  °[facility] [address], [place], [place] (336)723-1848, Ext. 123 Second and Fourth Thursday of each month, opens at 6:30 AM; Clinic ends at 9 AM.  Patients are seen on a first-come first-served basis, and a limited number are seen during each clinic.  ° °[facility] ° [address], [place], [place] (336) [phone]    Eligibility Requirements °You must have lived in [place], [place], or [place] counties for at least the last three months. °  You cannot be eligible for state or federal sponsored healthcare insurance, including Veterans Administration, Medicaid, or Medicare. °  You generally cannot be eligible for healthcare insurance through your employer.  °  How to apply: °Eligibility screenings are held every Tuesday and Wednesday afternoon from 1:00 pm until 4:00 pm. You do not need an appointment for the interview!  °[facility] [address], [place], [place] 336-631-2330   °[facility]  336-342-8273   °[facility]  336-703-3100   °[facility]  336-570-6415   ° °Behavioral Health Resources in the Community: °Intensive Outpatient Programs °Organization         Address  Phone  Notes  °[facility] [address], [place], [place] 336-878-6098   °[facility] [address], [place], [place] 336-832-9800   °[facility] [address], [place], [place] ° 336-882-2125   °[facility] [address],  °[place], [place] 1-800-853-5163 or 336-641-4981   °Substance Abuse Resources °Organization         Address  Phone  Notes  °[facility]  336-882-2125   °[facility]  336-784-9470   °[facility]  336-285-9073   °[facility]  336-845-3988   °Residential & Outpatient Substance Abuse Program  1-800-659-3381   °Psychological Services °Organization         Address  Phone  Notes  °[facility]  336- 832-9600   °[facility]  336- 378-7881   °[facility] [address], [place] 1-800-853-5163 or 336-641-4981   ° °Mobile Crisis Teams °Organization         Address  Phone  Notes  °[facility], Mobile Crisis Care Unit  1-877-626-1772   °Assertive °[facility] ° [address]  [place], [place] 336-834-9664   °Sharon DeEsch [address] °[place] 336-554-5454   ° °Self-Help/Support Groups °Organization         Address  Phone             Notes  °[facility] [place] [facility] - variety of support groups  336- 373-1402 Call for more information  °Narcotics Anonymous (NA), [facility] [address], °[place]  2 meetings at this location  ° °  Residential Treatment Programs Organization         Address  Phone  Notes  [facility] [address]    [place]  1-(262)697-5819   [facility]  [address] [place] [id number], [place], [place]   [place], [place] (617)767-5316 Admissions: 8am-3pm M-F  [facility] [address],    [place], [place] 324-401-0272   [facility] [address] [place], [place], [place]   [facility] [address],  [place], [place]   [facility] Intensive Outpatient [address], [place], [place]   [facility] [place]  [place], [place] 1-(514)741-1696 or 202-404-4569   [facility] [address], [place], [place] Accepts Medicaid  [facility] [address],  [place] 1-(979)825-0273 Substance Abuse/Addiction Treatment   [facility]         Address  Phone  Notes  [facility]  865-174-0779   Domenic Schwab, PhD [address] [place], [place]   508-821-6131 or 571-175-4052   [facility] [place], [place] 438-727-8551   [facility] [address], [place], [place] [phone] Insurance/Medicaid/sponsorship through [facility] and Families [address]                                    [place], [place] [phone] [place] [address], [place] [phone]    Dr. Adele Schilder  [phone]   [facility] Dept. 1) [address], [place] 2) [place] 3)  [place] [address], [place] [phone]  [phone]   [place] [phone] or [phone] (After Hours)       Take your usual prescriptions as previously directed.  Take your blood pressure only ONCE per day, once or twice per week, either in the morning after you take your medicine(s) or in the evening before you go to bed.  Always sit quietly for at least 15 minutes before taking your blood pressure.  Keep a diary of your blood pressures to show your doctor at your follow up office visit. Apply moist heat or ice to the area(s) of discomfort, for 15 minutes at a time, several times per day for the next few days.  Do not fall asleep on a heating or ice pack. Call your regular medical doctor today to schedule a follow up appointment within the next 2 days.  Return to the Emergency Department immediately sooner if worsening.

## 2013-11-16 NOTE — ED Provider Notes (Signed)
CSN: [id number]     Arrival date & time 11/16/13  1048 History   First MD Initiated Contact with Patient 11/16/13 1254     Chief Complaint  Patient presents with  . Chest Pain      HPI Pt was seen at 1320. Per pt, c/o gradual onset and persistence of constant acute flair of her chronic left upper chest wall "pain" that began yesterday morning. Pt describes the pain as "constant sharp pressure." States her chest pain worsened "when I was taking my blood pressure and it was high." Pt states for the past 2 days, she has been taking her BP repetitively throughout the day, starting after she wakes up in the mornings. States she took her blood pressure "every 30 minutes" overnight last night. States she was "getting more and more upset" the more she took her BP "and it wasn't going down." States she was evaluated by the Health Dept PTA, then sent to the ED for further evaluation. Denies any change in her usual chronic CP since 07/2013. Denies palpitations, no SOB/cough, no abd pain, no N/V/D, no back pain.     Past Medical History  Diagnosis Date  . Hypertension   . CAD (coronary artery disease)   . History of breast cancer   . History of stress test 07/2013    normal  . Chronic headache   . GERD (gastroesophageal reflux disease)   . Chronic chest pain   . History of stress test 2003    normal adenosine cardiolite study   Past Surgical History  Procedure Laterality Date  . Cesarean section    . Breast surgery      due to Cancer  . Throat surgery    . Brain surgery      History  Substance Use Topics  . Smoking status: Current Every Day Smoker -- 0.25 packs/day    Types: Cigarettes  . Smokeless tobacco: Not on file  . Alcohol Use: No    Review of Systems ROS: Statement: All systems negative except as marked or noted in the HPI; Constitutional: Negative for fever and chills. ; ; Eyes: Negative for eye pain, redness and discharge. ; ; ENMT: Negative for ear pain, hoarseness, nasal  congestion, sinus pressure and sore throat. ; ; Cardiovascular: +chronic CP. Negative for palpitations, diaphoresis, dyspnea and peripheral edema. ; ; Respiratory: Negative for cough, wheezing and stridor. ; ; Gastrointestinal: Negative for nausea, vomiting, diarrhea, abdominal pain, blood in stool, hematemesis, jaundice and rectal bleeding. . ; ; Genitourinary: Negative for dysuria, flank pain and hematuria. ; ; Musculoskeletal: Negative for back pain and neck pain. Negative for swelling and trauma.; ; Skin: Negative for pruritus, rash, abrasions, blisters, bruising and skin lesion.; ; Neuro: Negative for headache, lightheadedness and neck stiffness. Negative for weakness, altered level of consciousness , altered mental status, extremity weakness, paresthesias, involuntary movement, seizure and syncope.; Psych:  +anxiety. No SI, no SA, no HI, no hallucinations.     Allergies  Review of patient's allergies indicates no known allergies.  Home Medications   Current Outpatient Rx  Name  Route  Sig  Dispense  Refill  . ALPRAZolam (XANAX) 1 MG tablet   Oral   Take 1 mg by mouth at bedtime as needed for anxiety or sleep.         Marland Kitchen aspirin 325 MG tablet   Oral   Take 650 mg by mouth daily as needed for mild pain, moderate pain or headache.         Marland Kitchen  cyanocobalamin 500 MCG tablet   Oral   Take 500 mcg by mouth daily.         Mariane Baumgarten Calcium (STOOL SOFTENER PO)   Oral   Take 2 capsules by mouth daily.         . nitroGLYCERIN (NITROSTAT) 0.4 MG SL tablet   Sublingual   Place 0.4 mg under the tongue every 5 (five) minutes as needed for chest pain.          BP 160/95  Pulse 79  Temp(Src) 98.6 F (37 C) (Oral)  Resp 22  Ht 5\' 3"  (1.6 m)  Wt 157 lb (71.215 kg)  BMI 27.82 kg/m2  SpO2 96% Filed Vitals:   11/16/13 1200 11/16/13 1215 11/16/13 1300 11/16/13 1409  BP: 142/61  114/71 160/95  Pulse: 67 63 64 79  Temp:      TempSrc:      Resp:   15 22  Height:      Weight:       SpO2: 97% 99% 96% 96%    Physical Exam 1325: Physical examination:  Nursing notes reviewed; Vital signs and O2 SAT reviewed;  Constitutional: Well developed, Well nourished, Well hydrated, In no acute distress; Head:  Normocephalic, atraumatic; Eyes: EOMI, PERRL, No scleral icterus; ENMT: Mouth and pharynx normal, Mucous membranes moist; Neck: Supple, Full range of motion, No lymphadenopathy; Cardiovascular: Regular rate and rhythm, No murmur, rub, or gallop; Respiratory: Breath sounds clear & equal bilaterally, No rales, rhonchi, wheezes.  Speaking full sentences with ease, Normal respiratory effort/excursion; Chest: +left upper chest wall area tender to palp. No rash, no soft tissue crepitus, no deformity. Movement normal; Abdomen: Soft, Nontender, Nondistended, Normal bowel sounds; Genitourinary: No CVA tenderness; Extremities: Pulses normal, No tenderness, No edema, No calf edema or asymmetry.; Neuro: AA&Ox3, Major CN grossly intact.  Speech clear. No gross focal motor or sensory deficits in extremities.; Skin: Color normal, Warm, Dry.; Psych:  Anxious, tearful.    ED Course  Procedures     EKG Interpretation None      MDM  MDM Reviewed: previous chart, nursing note and vitals Reviewed previous: labs and ECG Interpretation: labs, ECG and x-ray    Date: 11/16/2013  Rate: 66  Rhythm: normal sinus rhythm  QRS Axis: normal  Intervals: normal  ST/T Wave abnormalities: normal  Conduction Disutrbances:none  Narrative Interpretation:   Old EKG Reviewed: unchanged; no significant changes from previous EKG dated 11/12/2013.  Results for orders placed during the hospital encounter of 11/16/13  CBC WITH DIFFERENTIAL      Result Value Ref Range   WBC 7.4  4.0 - 10.5 K/uL   RBC 4.70  3.87 - 5.11 MIL/uL   Hemoglobin 14.5  12.0 - 15.0 g/dL   HCT 42.8  36.0 - 46.0 %   MCV 91.1  78.0 - 100.0 fL   MCH 30.9  26.0 - 34.0 pg   MCHC 33.9  30.0 - 36.0 g/dL   RDW 14.0  11.5 - 15.5 %    Platelets 235  150 - 400 K/uL   Neutrophils Relative % 71  43 - 77 %   Neutro Abs 5.3  1.7 - 7.7 K/uL   Lymphocytes Relative 21  12 - 46 %   Lymphs Abs 1.6  0.7 - 4.0 K/uL   Monocytes Relative 7  3 - 12 %   Monocytes Absolute 0.5  0.1 - 1.0 K/uL   Eosinophils Relative 1  0 - 5 %   Eosinophils Absolute 0.1  0.0 - 0.7 K/uL   Basophils Relative 0  0 - 1 %   Basophils Absolute 0.0  0.0 - 0.1 K/uL  BASIC METABOLIC PANEL      Result Value Ref Range   Sodium 141  137 - 147 mEq/L   Potassium 3.4 (*) 3.7 - 5.3 mEq/L   Chloride 101  96 - 112 mEq/L   CO2 27  19 - 32 mEq/L   Glucose, Bld 114 (*) 70 - 99 mg/dL   BUN 4 (*) 6 - 23 mg/dL   Creatinine, Ser 0.64  0.50 - 1.10 mg/dL   Calcium 9.6  8.4 - 10.5 mg/dL   GFR calc non Af Amer >90  >90 mL/min   GFR calc Af Amer >90  >90 mL/min  TROPONIN I      Result Value Ref Range   Troponin I <0.30  <0.30 ng/mL    Dg Chest 2 View 11/16/2013   CLINICAL DATA:  CHEST PAIN  EXAM: CHEST  2 VIEW  COMPARISON:  DG CHEST 1V PORT dated 11/12/2013  FINDINGS: The heart size and mediastinal contours are within normal limits. Both lungs are clear. The visualized skeletal structures are unremarkable.  IMPRESSION: No active cardiopulmonary disease.   Electronically Signed   By: Margaree Mackintosh M.D.   On: 11/16/2013 11:54    1430:  EPIC chart reviewed: pt has history of chronic chest pain, last admission 10/26/2013 was noted to have constant chest pain since 07/2013, worse with movement, had normal Lexiscan 07/2013. Doubt PE as cause for symptoms with low risk [place].  Doubt ACS as cause for symptoms with normal troponin and unchanged EKG from previous after 2 days of constant symptoms. Pt's BP has been stable while in the ED.  Pt tearful, stating she "keeps taking my BP and it goes up." Has been taking her BP repetitively throughout the day yesterday, as well as "every 30 minutes" overnight last night. Does admit that she became "more and more upset" the more times she took her  BP at home.  Long d/w pt regarding taking her BP at home: taking her BP only 1 or 2 times per week, sitting quietly for 14min before taking her BP once and to write that number in a diary to show her PMD next office visit, as well as not repetitively taking her BP over several hours or throughout the day, and to bring her BP machine to her PMD's office to check its accuracy. Pt verb understanding, continues tearful, stating she "just was getting upset about by BP." Denies SI. States she is ready to go home now. Pt was agreeable to take potassium PO, but refused any medication for pain (including APAP). Dx and testing d/w pt.  Questions answered.  Verb understanding, agreeable to d/c home with outpt f/u.          Alfonzo Feller, DO 11/18/13 1512

## 2014-03-24 ENCOUNTER — Emergency Department (HOSPITAL_COMMUNITY)
Admission: EM | Admit: 2014-03-24 | Discharge: 2014-03-24 | Discharge status: Against Medical Advice | Payer: Self-pay | Attending: Emergency Medicine | Admitting: Emergency Medicine

## 2014-03-24 ENCOUNTER — Encounter (HOSPITAL_COMMUNITY): Payer: Self-pay | Admitting: Emergency Medicine

## 2014-03-24 DIAGNOSIS — I2510 Atherosclerotic heart disease of native coronary artery without angina pectoris: Secondary | ICD-10-CM | POA: Insufficient documentation

## 2014-03-24 DIAGNOSIS — R079 Chest pain, unspecified: Secondary | ICD-10-CM | POA: Insufficient documentation

## 2014-03-24 DIAGNOSIS — Z8719 Personal history of other diseases of the digestive system: Secondary | ICD-10-CM | POA: Insufficient documentation

## 2014-03-24 DIAGNOSIS — Z7982 Long term (current) use of aspirin: Secondary | ICD-10-CM | POA: Insufficient documentation

## 2014-03-24 DIAGNOSIS — R51 Headache: Secondary | ICD-10-CM | POA: Insufficient documentation

## 2014-03-24 DIAGNOSIS — Z79899 Other long term (current) drug therapy: Secondary | ICD-10-CM | POA: Insufficient documentation

## 2014-03-24 DIAGNOSIS — F17200 Nicotine dependence, unspecified, uncomplicated: Secondary | ICD-10-CM | POA: Insufficient documentation

## 2014-03-24 DIAGNOSIS — Z853 Personal history of malignant neoplasm of breast: Secondary | ICD-10-CM | POA: Insufficient documentation

## 2014-03-24 DIAGNOSIS — G8929 Other chronic pain: Secondary | ICD-10-CM | POA: Insufficient documentation

## 2014-03-24 DIAGNOSIS — I10 Essential (primary) hypertension: Secondary | ICD-10-CM | POA: Insufficient documentation

## 2014-03-24 LAB — BASIC METABOLIC PANEL
Anion gap: 11 (ref 5–15)
BUN: 7 mg/dL (ref 6–23)
CO2: 25 mEq/L (ref 19–32)
Calcium: 9.2 mg/dL (ref 8.4–10.5)
Chloride: 102 mEq/L (ref 96–112)
Creatinine, Ser: 0.76 mg/dL (ref 0.50–1.10)
GFR calc Af Amer: >90 mL/min (ref >90)
GFR, EST NON AFRICAN AMERICAN: 89 mL/min — AB (ref >90)
Glucose, Bld: 109 mg/dL — ABNORMAL HIGH (ref 70–99)
Potassium: 4 mEq/L (ref 3.7–5.3)
Sodium: 138 mEq/L (ref 137–147)

## 2014-03-24 LAB — CBC WITH DIFFERENTIAL/PLATELET
Basophils Absolute: 0 10*3/uL (ref 0.0–0.1)
Basophils Relative: 0 % (ref 0–1)
EOS ABS: 0.1 10*3/uL (ref 0.0–0.7)
EOS PCT: 1 % (ref 0–5)
HCT: 40.2 % (ref 36.0–46.0)
Hemoglobin: 13.8 g/dL (ref 12.0–15.0)
LYMPHS ABS: 2.3 10*3/uL (ref 0.7–4.0)
Lymphocytes Relative: 29 % (ref 12–46)
MCH: 30.8 pg (ref 26.0–34.0)
MCHC: 34.3 g/dL (ref 30.0–36.0)
MCV: 89.7 fL (ref 78.0–100.0)
MONO ABS: 0.5 10*3/uL (ref 0.1–1.0)
Monocytes Relative: 6 % (ref 3–12)
Neutro Abs: 5.1 10*3/uL (ref 1.7–7.7)
Neutrophils Relative %: 64 % (ref 43–77)
PLATELETS: 211 10*3/uL (ref 150–400)
RBC: 4.48 MIL/uL (ref 3.87–5.11)
RDW: 13.6 % (ref 11.5–15.5)
WBC: 8 10*3/uL (ref 4.0–10.5)

## 2014-03-24 LAB — I-STAT TROPONIN, ED: Troponin i, poc: 0.04 ng/mL (ref 0.00–0.08)

## 2014-03-24 MED ORDER — ACETAMINOPHEN 325 MG PO TABS
650.0000 mg | ORAL_TABLET | Freq: Once | ORAL | Status: AC
  Administered 2014-03-24: 650 mg via ORAL
  Filled 2014-03-24: qty 2

## 2014-03-24 NOTE — ED Provider Notes (Signed)
CSN: [id number]     Arrival date & time 03/24/14  1856 History   First MD Initiated Contact with Patient 03/24/14 1910     Chief Complaint  Patient presents with  . Hypertension     (Consider location/radiation/quality/duration/timing/severity/associated sxs/prior Treatment) HPI 63 year old female with several months of several episodes week each of combination of chest pressure lasting hours at a time nonexertional without associated symptoms but does radiate toward her left shoulder worse with torso movement and palpation, she also has several episodes a week of diffuse global headaches gradual onset lasting several hours at a time without change in speech or vision swallowing or understanding without lateralizing weakness numbness or incoordination she does occasionally have episodes of positional vertigo, she also for several months has chronic intermittent episodes of lightheadedness with near-syncope lasting anywhere from seconds to minutes to hours at a time, she also feels as if her entire body is on fire burning 24 hours a day from her head to her feet or hands to her entire torso for the last few days without fever rash confusion or trauma. She still smokes and was told to discontinue. There is no treatment prior to arrival. She came to the emergency room today because she states for the first time in months her blood pressure was greater than 200 last night but today was in the 50D systolic prior to arrival but then increased to the 326Z systolic prior to arrival and she states her doctor told her to come to the emergency department if her blood pressure was ever above 180. Her current chest pressure has been present several hours a day for the last few days. Past Medical History  Diagnosis Date  . Hypertension   . CAD (coronary artery disease)   . History of breast cancer   . History of stress test 07/2013    normal  . Chronic headache   . GERD (gastroesophageal reflux disease)   .  Chronic chest pain   . History of stress test 2003    normal adenosine cardiolite study   Past Surgical History  Procedure Laterality Date  . Cesarean section    . Breast surgery      due to Cancer  . Throat surgery    . Brain surgery     No family history on file. History  Substance Use Topics  . Smoking status: Current Every Day Smoker -- 0.25 packs/day    Types: Cigarettes  . Smokeless tobacco: Not on file  . Alcohol Use: No   OB History   Grav Para Term Preterm Abortions TAB SAB Ect Mult Living                 Review of Systems 10 Systems reviewed and are negative for acute change except as noted in the HPI.   Allergies  Review of patient's allergies indicates no known allergies.  Home Medications   Prior to Admission medications   Medication Sig Start Date End Date Taking? Authorizing Provider  ALPRAZolam [name]) 1 MG tablet Take 1 mg by mouth at bedtime as needed for anxiety or sleep.   Yes Historical Provider, MD  amLODipine (NORVASC) 10 MG tablet Take 10 mg by mouth daily.   Yes Historical Provider, MD  aspirin 325 MG tablet Take 325 mg by mouth daily.    Yes Historical Provider, MD  docusate sodium (COLACE) 100 MG capsule Take 200 mg by mouth daily.   Yes Historical Provider, MD  nitroGLYCERIN (NITROSTAT) 0.4 MG SL  tablet Place 0.4 mg under the tongue every 5 (five) minutes as needed for chest pain.   Yes Historical Provider, MD  potassium chloride (K-DUR,KLOR-CON) 10 MEQ tablet Take 10 mEq by mouth daily.   Yes Historical Provider, MD   BP 137/85  Pulse 78  Temp(Src) 97.9 F (36.6 C) (Temporal)  Resp 17  Ht 5\' 3"  (1.6 m)  Wt 150 lb (68.04 kg)  BMI 26.58 kg/m2  SpO2 96% Physical Exam  Nursing note and vitals reviewed. Constitutional:  Awake, alert, nontoxic appearance with baseline speech for patient.  HENT:  Head: Atraumatic.  Mouth/Throat: No oropharyngeal exudate.  Eyes: EOM are normal. Pupils are equal, round, and reactive to light. Right eye  exhibits no discharge. Left eye exhibits no discharge.  Fundi discs appear sharp no hemorrhage noted  Neck: Neck supple.  Cardiovascular: Normal rate and regular rhythm.   No murmur heard. Pulmonary/Chest: Effort normal and breath sounds normal. No stridor. No respiratory distress. She has no wheezes. She has no rales. She exhibits tenderness.  Left chest wall tender  Abdominal: Soft. Bowel sounds are normal. She exhibits no mass. There is no tenderness. There is no rebound.  Musculoskeletal: She exhibits no tenderness.  Baseline ROM, moves extremities with no obvious new focal weakness.  Lymphadenopathy:    She has no cervical adenopathy.  Neurological:  Awake, alert, cooperative and aware of situation; motor strength bilaterally; sensation normal to light touch bilaterally; peripheral visual fields full to confrontation; no facial asymmetry; tongue midline; major cranial nerves appear intact; no pronator drift, normal finger to nose bilaterally, baseline gait without new ataxia.  Skin: No rash noted.  Psychiatric:  Patient appears anxious    ED Course  Procedures (including critical care time) Patient / Family / Caregiver understand and agree with initial ED impression and plan with expectations set for ED visit.  Pt eloped.  Labs Review Labs Reviewed  BASIC METABOLIC PANEL - Abnormal; Notable for the following:    Glucose, Bld 109 (*)    GFR calc non Af Amer 89 (*)    All other components within normal limits  CBC WITH DIFFERENTIAL  I-STAT TROPOININ, ED    Imaging Review No results found.   EKG Interpretation   Date/Time:  Sunday March 24 2014 19:49:46 EDT Ventricular Rate:  87 PR Interval:  136 QRS Duration: 75 QT Interval:  360 QTC Calculation: 433 R Axis:   20 Text Interpretation:  Sinus rhythm Low voltage, precordial leads No  significant change since last tracing Confirmed by [facility]  MD, Jenny Reichmann  [phone]) on 03/24/2014 7:54:36 PM      MDM   Final diagnoses:   Essential hypertension  Chest pain, unspecified chest pain type  Chronic nonintractable headache, unspecified headache type        Babette Relic, MD 03/25/14 1143

## 2014-03-24 NOTE — ED Notes (Signed)
"  how long I been here? He told me 2 hours and that's all he's got. I'm leaving at 2 hours"

## 2014-03-24 NOTE — ED Notes (Signed)
Pt left before being discharged by MD.

## 2014-03-24 NOTE — ED Notes (Signed)
Pt c/o "Im burning up in the inside and my blood pressure has been going weird all week". Reports has a "coranry blockage in my neck" and is supposed to follow up concerning that on Tuesday.

## 2014-05-18 ENCOUNTER — Emergency Department (HOSPITAL_COMMUNITY)
Admission: EM | Admit: 2014-05-18 | Discharge: 2014-05-18 | Disposition: A | Discharge status: Home / Self Care | Payer: Self-pay | Attending: Emergency Medicine | Admitting: Emergency Medicine

## 2014-05-18 ENCOUNTER — Emergency Department (HOSPITAL_COMMUNITY): Payer: Self-pay

## 2014-05-18 ENCOUNTER — Encounter (HOSPITAL_COMMUNITY): Payer: Self-pay | Admitting: Emergency Medicine

## 2014-05-18 DIAGNOSIS — F17200 Nicotine dependence, unspecified, uncomplicated: Secondary | ICD-10-CM | POA: Insufficient documentation

## 2014-05-18 DIAGNOSIS — R000 Tachycardia, unspecified: Secondary | ICD-10-CM | POA: Insufficient documentation

## 2014-05-18 DIAGNOSIS — Z853 Personal history of malignant neoplasm of breast: Secondary | ICD-10-CM | POA: Insufficient documentation

## 2014-05-18 DIAGNOSIS — Z79899 Other long term (current) drug therapy: Secondary | ICD-10-CM | POA: Insufficient documentation

## 2014-05-18 DIAGNOSIS — I10 Essential (primary) hypertension: Secondary | ICD-10-CM | POA: Insufficient documentation

## 2014-05-18 DIAGNOSIS — Z7982 Long term (current) use of aspirin: Secondary | ICD-10-CM | POA: Insufficient documentation

## 2014-05-18 DIAGNOSIS — I2510 Atherosclerotic heart disease of native coronary artery without angina pectoris: Secondary | ICD-10-CM | POA: Insufficient documentation

## 2014-05-18 DIAGNOSIS — G8929 Other chronic pain: Secondary | ICD-10-CM | POA: Insufficient documentation

## 2014-05-18 DIAGNOSIS — Z8719 Personal history of other diseases of the digestive system: Secondary | ICD-10-CM | POA: Insufficient documentation

## 2014-05-18 DIAGNOSIS — R0789 Other chest pain: Secondary | ICD-10-CM | POA: Insufficient documentation

## 2014-05-18 DIAGNOSIS — Z9889 Other specified postprocedural states: Secondary | ICD-10-CM | POA: Insufficient documentation

## 2014-05-18 LAB — I-STAT CHEM 8, ED
BUN: 5 mg/dL — ABNORMAL LOW (ref 6–23)
Calcium, Ion: 1.09 mmol/L — ABNORMAL LOW (ref 1.13–1.30)
Chloride: 101 mEq/L (ref 96–112)
Creatinine, Ser: 0.6 mg/dL (ref 0.50–1.10)
Glucose, Bld: 98 mg/dL (ref 70–99)
HEMATOCRIT: 43 % (ref 36.0–46.0)
HEMOGLOBIN: 14.6 g/dL (ref 12.0–15.0)
Potassium: 3.7 mEq/L (ref 3.7–5.3)
SODIUM: 136 meq/L — AB (ref 137–147)
TCO2: 22 mmol/L (ref 0–100)

## 2014-05-18 LAB — BASIC METABOLIC PANEL
ANION GAP: 13 (ref 5–15)
BUN: 8 mg/dL (ref 6–23)
CHLORIDE: 100 meq/L (ref 96–112)
CO2: 24 meq/L (ref 19–32)
CREATININE: 0.6 mg/dL (ref 0.50–1.10)
Calcium: 9.2 mg/dL (ref 8.4–10.5)
GFR calc Af Amer: >90 mL/min (ref >90)
GFR calc non Af Amer: >90 mL/min (ref >90)
Glucose, Bld: 100 mg/dL — ABNORMAL HIGH (ref 70–99)
Potassium: 4 mEq/L (ref 3.7–5.3)
Sodium: 137 mEq/L (ref 137–147)

## 2014-05-18 LAB — CBC
HCT: 40.3 % (ref 36.0–46.0)
Hemoglobin: 13.8 g/dL (ref 12.0–15.0)
MCH: 30.1 pg (ref 26.0–34.0)
MCHC: 34.2 g/dL (ref 30.0–36.0)
MCV: 88 fL (ref 78.0–100.0)
Platelets: 260 10*3/uL (ref 150–400)
RBC: 4.58 MIL/uL (ref 3.87–5.11)
RDW: 13.8 % (ref 11.5–15.5)
WBC: 9.4 10*3/uL (ref 4.0–10.5)

## 2014-05-18 LAB — PRO B NATRIURETIC PEPTIDE: Pro B Natriuretic peptide (BNP): 59.4 pg/mL (ref 0–125)

## 2014-05-18 LAB — I-STAT TROPONIN, ED
TROPONIN I, POC: 0 ng/mL (ref 0.00–0.08)
TROPONIN I, POC: 0 ng/mL (ref 0.00–0.08)

## 2014-05-18 MED ORDER — ACETAMINOPHEN 325 MG PO TABS
975.0000 mg | ORAL_TABLET | Freq: Once | ORAL | Status: AC
  Administered 2014-05-18: 975 mg via ORAL
  Filled 2014-05-18: qty 3

## 2014-05-18 MED ORDER — HYDROCODONE-ACETAMINOPHEN 5-325 MG PO TABS: 1.0000 | ORAL_TABLET | Freq: Four times a day (QID) | ORAL | Status: DC | PRN

## 2014-05-18 NOTE — Discharge Instructions (Signed)
Chest Pain (Nonspecific) Take Tylenol for mild pain or the pain medicine prescribed for bad pain. Do not take the medication prescribed together with Xanax (alprazolam), as the combination can be dangerous. Continue to work on quitting smoking. Ask your doctor for help. Contact your doctor in 2 days to arrange a followup appointment. You may need further testing as an outpatient. It is often hard to give a diagnosis for the cause of chest pain. There is always a chance that your pain could be related to something serious, such as a heart attack or a blood clot in the lungs. You need to follow up with your doctor. HOME CARE  If antibiotic medicine was given, take it as directed by your doctor. Finish the medicine even if you start to feel better.  For the next few days, avoid activities that bring on chest pain. Continue physical activities as told by your doctor.  Do not use any tobacco products. This includes cigarettes, chewing tobacco, and e-cigarettes.  Avoid drinking alcohol.  Only take medicine as told by your doctor.  Follow your doctor's suggestions for more testing if your chest pain does not go away.  Keep all doctor visits you made. GET HELP IF:  Your chest pain does not go away, even after treatment.  You have a rash with blisters on your chest.  You have a fever. GET HELP RIGHT AWAY IF:   You have more pain or pain that spreads to your arm, neck, jaw, back, or belly (abdomen).  You have shortness of breath.  You cough more than usual or cough up blood.  You have very bad back or belly pain.  You feel sick to your stomach (nauseous) or throw up (vomit).  You have very bad weakness.  You pass out (faint).  You have chills. This is an emergency. Do not wait to see if the problems will go away. Call your local emergency services (911 in [place] Do not drive yourself to the hospital. MAKE SURE YOU:   Understand these instructions.  Will watch your  condition.  Will get help right away if you are not doing well or get worse. Document Released: 01/26/2008 Document Revised: 08/14/2013 Document Reviewed: 01/26/2008 [facility] Patient Information 2015 [place], [place]. This information is not intended to replace advice given to you by your health care provider. Make sure you discuss any questions you have with your health care provider.

## 2014-05-18 NOTE — ED Provider Notes (Addendum)
CSN: [id number]     Arrival date & time 05/18/14  1742 History   First MD Initiated Contact with Patient 05/18/14 1752     Chief Complaint  Patient presents with  . Chest Pain     (Consider location/radiation/quality/duration/timing/severity/associated sxs/prior Treatment) HPI Complains of anterior chest pain covering a 1 cm area at left sternal border onset 3 hours ago with "numbness and heaviness in her left arm. Pain is pleuritic and worse with changing positions. Patient has taken 4 nitroglycerin since onset of pain with relief for a few seconds. Pain is also worse with standing improved with lying supine, nonexertional. Patient also treated herself with aspirin this morning no cough no fever. No other associated symptoms Past Medical History  Diagnosis Date  . Hypertension   . CAD (coronary artery disease)   . History of breast cancer   . History of stress test 07/2013    normal  . Chronic headache   . GERD (gastroesophageal reflux disease)   . Chronic chest pain   . History of stress test 2003    normal adenosine cardiolite study   no known history of coronary disease. Patient does suffer from chronic chest pain, daily for at least several months which she describes as a "heaviness." Has never had cardiac cath .Patient reports history of carotid disease. She had negative nuclear medicine myocardial perfusion imaging 08/04/2013 . Also had negative CT angiogram of chest 08/04/2013 Cardiac risk factors smoker, family history, brother had MI in his 42's Past Surgical History  Procedure Laterality Date  . Cesarean section    . Breast surgery      due to Cancer  . Throat surgery    . Brain surgery     No family history on file. History  Substance Use Topics  . Smoking status: Current Every Day Smoker -- 0.25 packs/day    Types: Cigarettes  . Smokeless tobacco: Not on file  . Alcohol Use: No   OB History   Grav Para Term Preterm Abortions TAB SAB Ect Mult Living                  Review of Systems  Constitutional: Negative.   HENT: Negative.   Respiratory: Negative.   Cardiovascular: Positive for chest pain.  Gastrointestinal: Negative.   Musculoskeletal: Negative.   Skin: Negative.   Neurological: Negative.   Psychiatric/Behavioral: Negative.   All other systems reviewed and are negative.     Allergies  Review of patient's allergies indicates no known allergies.  Home Medications   Prior to Admission medications   Medication Sig Start Date End Date Taking? Authorizing Provider  ALPRAZolam [name]) 1 MG tablet Take 1 mg by mouth at bedtime as needed for anxiety or sleep.    Historical Provider, MD  amLODipine (NORVASC) 10 MG tablet Take 10 mg by mouth daily.    Historical Provider, MD  aspirin 325 MG tablet Take 325 mg by mouth daily.     Historical Provider, MD  docusate sodium (COLACE) 100 MG capsule Take 200 mg by mouth daily.    Historical Provider, MD  nitroGLYCERIN (NITROSTAT) 0.4 MG SL tablet Place 0.4 mg under the tongue every 5 (five) minutes as needed for chest pain.    Historical Provider, MD  potassium chloride (K-DUR,KLOR-CON) 10 MEQ tablet Take 10 mEq by mouth daily.    Historical Provider, MD   BP 156/89  Pulse 91  Temp(Src) 98.2 F (36.8 C) (Oral)  Resp 18  Ht 5\' 3"  (1.6  m)  Wt 150 lb (68.04 kg)  BMI 26.58 kg/m2  SpO2 97% Physical Exam  Nursing note and vitals reviewed. Constitutional: She appears well-developed and well-nourished.  HENT:  Head: Normocephalic and atraumatic.  Eyes: Conjunctivae are normal. Pupils are equal, round, and reactive to light.  Neck: Neck supple. No tracheal deviation present. No thyromegaly present.  Cardiovascular: Normal rate, regular rhythm and intact distal pulses.   No murmur heard. Pulmonary/Chest: Effort normal and breath sounds normal. She exhibits tenderness.  Patient is exquisitely tender at left sternal border, reproducing pain exactly  Abdominal: Soft. Bowel sounds are normal. She  exhibits no distension. There is no tenderness.  Musculoskeletal: Normal range of motion. She exhibits no edema and no tenderness.  Neurological: She is alert. Coordination normal.  Skin: Skin is warm and dry. No rash noted.  Psychiatric: She has a normal mood and affect.    ED Course  Procedures (including critical care time) Labs Review Labs Reviewed  [place], ED    Imaging Review No results found.   EKG Interpretation   Date/Time:  Saturday May 18 2014 17:46:06 EDT Ventricular Rate:  95 PR Interval:  140 QRS Duration: 64 QT Interval:  326 QTC Calculation: 409 R Axis:   11 Text Interpretation:  Normal sinus rhythm Cannot rule out Anterior infarct  , age undetermined Abnormal ECG No significant change since last tracing  Confirmed by Winfred Leeds  MD, Erie Sica [phone]) on 05/18/2014 5:52:38 PM     Chest xray viewed by me Results for orders placed during the hospital encounter of 05/18/14  CBC      Result Value Ref Range   WBC 9.4  4.0 - 10.5 K/uL   RBC 4.58  3.87 - 5.11 MIL/uL   Hemoglobin 13.8  12.0 - 15.0 g/dL   HCT 40.3  36.0 - 46.0 %   MCV 88.0  78.0 - 100.0 fL   MCH 30.1  26.0 - 34.0 pg   MCHC 34.2  30.0 - 36.0 g/dL   RDW 13.8  11.5 - 15.5 %   Platelets 260  150 - 400 K/uL  BASIC METABOLIC PANEL      Result Value Ref Range   Sodium 137  137 - 147 mEq/L   Potassium 4.0  3.7 - 5.3 mEq/L   Chloride 100  96 - 112 mEq/L   CO2 24  19 - 32 mEq/L   Glucose, Bld 100 (*) 70 - 99 mg/dL   BUN 8  6 - 23 mg/dL   Creatinine, Ser 0.60  0.50 - 1.10 mg/dL   Calcium 9.2  8.4 - 10.5 mg/dL   GFR calc non Af Amer >90  >90 mL/min   GFR calc Af Amer >90  >90 mL/min   Anion gap 13  5 - 15  PRO B NATRIURETIC PEPTIDE      Result Value Ref Range   Pro B Natriuretic peptide (BNP) 59.4  0 - 125 pg/mL  I-STAT TROPOININ, ED      Result Value Ref Range   Troponin i, poc 0.00  0.00 - 0.08 ng/mL   Comment 3            I-STAT CHEM 8, ED      Result Value Ref Range   Sodium 136 (*) 137 - 147 mEq/L   Potassium 3.7  3.7 - 5.3 mEq/L   Chloride 101  96 - 112 mEq/L   BUN 5 (*)  6 - 23 mg/dL   Creatinine, Ser 0.60  0.50 - 1.10 mg/dL   Glucose, Bld 98  70 - 99 mg/dL   Calcium, Ion 1.09 (*) 1.13 - 1.30 mmol/L   TCO2 22  0 - 100 mmol/L   Hemoglobin 14.6  12.0 - 15.0 g/dL   HCT 43.0  36.0 - 46.0 %  I-STAT TROPOININ, ED      Result Value Ref Range   Troponin i, poc 0.00  0.00 - 0.08 ng/mL   Comment 3            Dg Chest Port 1 View  05/18/2014   CLINICAL DATA:  Chest pain and shortness of breath over the last few days. Symptoms are worse. History of smoking and hypertension.  EXAM: PORTABLE CHEST - 1 VIEW  COMPARISON:  11/16/2013  FINDINGS: Heart size is within normal limits. The lungs are free of focal consolidations and pleural effusions. Mild prominence of interstitial markings appear stable over time. No pulmonary edema.  IMPRESSION: No evidence for acute  abnormality.   Electronically Signed   By: Shon Hale M.D.   On: 05/18/2014 18:33    915 pm pain not improved after treatment with tylenol , but pt feels ready to go ghome MDM  Heart score equals 4 based on age, risk factors exam is highly suggestive for musculoskeletal chest pain. Final diagnoses:  None   plan prescription Norco. Counseled patient for 5 minutes on smoking cessation  Diagnosis #1atypical chest pain #2 tobacco abuse    Orlie Dakin, MD 05/18/14 7510  Orlie Dakin, MD 05/18/14 2135

## 2014-05-18 NOTE — ED Notes (Signed)
Pt states that she has had chest apin "off and on" for the last month. Pt states that the pain has become worse over the last 24hrs. Pt states that she has taken 4 nitro in the last 45 minutes. Pt reports taking 325mg  of asprin as well.

## 2014-06-21 ENCOUNTER — Encounter (HOSPITAL_COMMUNITY): Payer: Self-pay | Admitting: Emergency Medicine

## 2014-06-21 ENCOUNTER — Observation Stay (HOSPITAL_COMMUNITY)
Admission: EM | Admit: 2014-06-21 | Discharge: 2014-06-23 | Disposition: A | Discharge status: Home / Self Care | Payer: Self-pay | Attending: Family Medicine | Admitting: Family Medicine

## 2014-06-21 ENCOUNTER — Emergency Department (HOSPITAL_COMMUNITY): Payer: Self-pay

## 2014-06-21 DIAGNOSIS — Z79899 Other long term (current) drug therapy: Secondary | ICD-10-CM | POA: Insufficient documentation

## 2014-06-21 DIAGNOSIS — K219 Gastro-esophageal reflux disease without esophagitis: Secondary | ICD-10-CM | POA: Insufficient documentation

## 2014-06-21 DIAGNOSIS — I10 Essential (primary) hypertension: Secondary | ICD-10-CM | POA: Diagnosis present

## 2014-06-21 DIAGNOSIS — R079 Chest pain, unspecified: Principal | ICD-10-CM | POA: Insufficient documentation

## 2014-06-21 DIAGNOSIS — Z720 Tobacco use: Secondary | ICD-10-CM | POA: Insufficient documentation

## 2014-06-21 DIAGNOSIS — I2510 Atherosclerotic heart disease of native coronary artery without angina pectoris: Secondary | ICD-10-CM | POA: Diagnosis present

## 2014-06-21 DIAGNOSIS — F17200 Nicotine dependence, unspecified, uncomplicated: Secondary | ICD-10-CM | POA: Diagnosis present

## 2014-06-21 DIAGNOSIS — Z853 Personal history of malignant neoplasm of breast: Secondary | ICD-10-CM | POA: Insufficient documentation

## 2014-06-21 DIAGNOSIS — G8929 Other chronic pain: Secondary | ICD-10-CM | POA: Insufficient documentation

## 2014-06-21 DIAGNOSIS — I25119 Atherosclerotic heart disease of native coronary artery with unspecified angina pectoris: Secondary | ICD-10-CM

## 2014-06-21 DIAGNOSIS — N644 Mastodynia: Secondary | ICD-10-CM | POA: Diagnosis present

## 2014-06-21 DIAGNOSIS — Z7982 Long term (current) use of aspirin: Secondary | ICD-10-CM | POA: Insufficient documentation

## 2014-06-21 LAB — TROPONIN I: Troponin I: <0.3 ng/mL (ref <0.30)

## 2014-06-21 LAB — BASIC METABOLIC PANEL
ANION GAP: 12 (ref 5–15)
BUN: 4 mg/dL — ABNORMAL LOW (ref 6–23)
CO2: 23 mEq/L (ref 19–32)
Calcium: 8.9 mg/dL (ref 8.4–10.5)
Chloride: 104 mEq/L (ref 96–112)
Creatinine, Ser: 0.63 mg/dL (ref 0.50–1.10)
GFR calc Af Amer: >90 mL/min (ref >90)
Glucose, Bld: 142 mg/dL — ABNORMAL HIGH (ref 70–99)
POTASSIUM: 3.7 meq/L (ref 3.7–5.3)
SODIUM: 139 meq/L (ref 137–147)

## 2014-06-21 LAB — CBC
HCT: 37.9 % (ref 36.0–46.0)
Hemoglobin: 12.8 g/dL (ref 12.0–15.0)
MCH: 31.2 pg (ref 26.0–34.0)
MCHC: 33.8 g/dL (ref 30.0–36.0)
MCV: 92.4 fL (ref 78.0–100.0)
PLATELETS: 215 10*3/uL (ref 150–400)
RBC: 4.1 MIL/uL (ref 3.87–5.11)
RDW: 13.8 % (ref 11.5–15.5)
WBC: 6.8 10*3/uL (ref 4.0–10.5)

## 2014-06-21 LAB — LIPID PANEL
Cholesterol: 182 mg/dL (ref 0–200)
HDL: 70 mg/dL (ref >39)
LDL CALC: 93 mg/dL (ref 0–99)
TRIGLYCERIDES: 94 mg/dL (ref <150)
Total CHOL/HDL Ratio: 2.6 RATIO
VLDL: 19 mg/dL (ref 0–40)

## 2014-06-21 LAB — PRO B NATRIURETIC PEPTIDE: PRO B NATRI PEPTIDE: 81.9 pg/mL (ref 0–125)

## 2014-06-21 LAB — TSH: TSH: 1.13 u[IU]/mL (ref 0.350–4.500)

## 2014-06-21 LAB — I-STAT TROPONIN, ED: TROPONIN I, POC: 0.01 ng/mL (ref 0.00–0.08)

## 2014-06-21 MED ORDER — FENTANYL CITRATE 0.05 MG/ML IJ SOLN
50.0000 ug | Freq: Once | INTRAMUSCULAR | Status: AC
  Administered 2014-06-21: 50 ug via INTRAVENOUS
  Filled 2014-06-21: qty 2

## 2014-06-21 MED ORDER — FENTANYL CITRATE 0.05 MG/ML IJ SOLN: 50.0000 ug | INTRAMUSCULAR | Status: DC | PRN

## 2014-06-21 MED ORDER — ASPIRIN EC 81 MG PO TBEC
81.0000 mg | DELAYED_RELEASE_TABLET | Freq: Every day | ORAL | Status: DC
  Administered 2014-06-22: 81 mg via ORAL
  Filled 2014-06-21 (×2): qty 1

## 2014-06-21 MED ORDER — POTASSIUM CHLORIDE CRYS ER 10 MEQ PO TBCR
10.0000 meq | EXTENDED_RELEASE_TABLET | Freq: Every day | ORAL | Status: DC
  Administered 2014-06-21 – 2014-06-22 (×2): 10 meq via ORAL
  Filled 2014-06-21 (×3): qty 1

## 2014-06-21 MED ORDER — PRAVASTATIN SODIUM 20 MG PO TABS
40.0000 mg | ORAL_TABLET | Freq: Every day | ORAL | Status: DC
  Administered 2014-06-22: 40 mg via ORAL
  Filled 2014-06-21 (×2): qty 2

## 2014-06-21 MED ORDER — GI COCKTAIL ~~LOC~~
30.0000 mL | Freq: Once | ORAL | Status: AC
  Administered 2014-06-21: 30 mL via ORAL
  Filled 2014-06-21: qty 30

## 2014-06-21 MED ORDER — SODIUM CHLORIDE 0.9 % IV BOLUS (SEPSIS)
500.0000 mL | Freq: Once | INTRAVENOUS | Status: AC
  Administered 2014-06-21: 500 mL via INTRAVENOUS

## 2014-06-21 MED ORDER — ACETAMINOPHEN 325 MG PO TABS
650.0000 mg | ORAL_TABLET | ORAL | Status: DC | PRN
  Administered 2014-06-22 – 2014-06-23 (×2): 650 mg via ORAL
  Filled 2014-06-21 (×2): qty 2

## 2014-06-21 MED ORDER — ONDANSETRON HCL 4 MG/2ML IJ SOLN
4.0000 mg | Freq: Four times a day (QID) | INTRAMUSCULAR | Status: DC | PRN
  Administered 2014-06-23: 4 mg via INTRAVENOUS
  Filled 2014-06-21: qty 2

## 2014-06-21 MED ORDER — ALPRAZOLAM 0.5 MG PO TABS: 1.0000 mg | ORAL_TABLET | Freq: Every evening | ORAL | Status: DC | PRN

## 2014-06-21 MED ORDER — NITROGLYCERIN 0.4 MG SL SUBL: 0.4000 mg | SUBLINGUAL_TABLET | SUBLINGUAL | Status: DC | PRN

## 2014-06-21 MED ORDER — AMLODIPINE BESYLATE 10 MG PO TABS
10.0000 mg | ORAL_TABLET | Freq: Every day | ORAL | Status: DC
  Filled 2014-06-21 (×2): qty 1

## 2014-06-21 MED ORDER — ENOXAPARIN SODIUM 40 MG/0.4ML ~~LOC~~ SOLN
40.0000 mg | SUBCUTANEOUS | Status: DC
  Administered 2014-06-21 – 2014-06-22 (×2): 40 mg via SUBCUTANEOUS
  Filled 2014-06-21 (×2): qty 0.4

## 2014-06-21 NOTE — ED Notes (Signed)
Per EMS: called out for cp & palpitations, upon ems arrival patient was anxious and hyperventilating.  C/o left sided sharp chest pain, sob, palpitations, and nausea.  Took 1 ntg prior to EMS arrival, took one with EMS as well, no relief.  Also chewed 325 of her own ASA prior to EMS arrival.  CP 8/10, sharp, left sided, radiating to neck and back.  Given 4 of zofran en route for nausea.  EMS states pt felt hot to touch, axillary temp obtained by them was 98.8.  Patient was watching TV at onset of cp.

## 2014-06-21 NOTE — ED Notes (Signed)
Dawn Mckay at bedside

## 2014-06-21 NOTE — H&P (Signed)
[facility] Admission History and Physical Service Pager: [phone]  Patient name: Dawn Mckay Medical record number: [id number] Date of birth: 01-31-1951 Age: 63 y.o. Gender: female  Primary Care Provider: No PCP Per Patient Consultants: none, yet Code Status: FULL  Chief Complaint: chest pain  Assessment and Plan: Dawn Mckay is a 63 y.o. female presenting with acute on chronic chest pain. PMH is significant for HTN, known CAD, anxiety.  # Chest pain - acute on chronic, anginal in description, HEART score is 4 (H1, A1, R2) with known CAD - Wells score for PE 1.5 so doubt PE, and EKG normal so doubt frank MI; initial POC troponin negative in the ED - HPI, exam, and history per chart review suggests chronic / recurrent MSK-type pain (?component of neck pain / radiculopathy with intermittent hand tingling) +/- component of anxiety - cycle troponins - ordered for Fentanyl 50 mcg q2h PRN, and will try GI cocktail as well - repeat EKG in the morning - consider addition of NSAID for inflammation, but hold off for now - risk stratify with TSH, lipid panel (see below; on statin at home), and A1c - ordered for echocardiogram and consider carotid dopplers given report of "blockage" in her neck - consider cardiology consult inpt vs attempting to set up outpt, given pt has appt in March  # HTN / CAD - per history, on ASA, Norvasc at home; questionable history of ?carotid blockage ("blockage in my neck" per pt) - BP apparently well-controlled, continue Norvasc and monitor - continue daily ASA - ordered for echocardiogram, consider carotid dopplers, as above  # HLD - on pravachol at home, 40 mg daily - recheck lipid panel as above, continue statin  # Anxiety, Chronic pain - pain management as above, plus continued home Xanax PRN at bedtime  # Tobacco abuse - encouraged complete cessation  FEN/GI: heart-healthy diet, saline lock IV Prophylaxis:  Lovenox  Disposition: place in observation, telemetry bed, with management as above, attending Dr. Lindell Noe - discharge pending improvement in pain  History of Present Illness: Dawn Mckay is a 63 y.o. female presenting with chest pain for about 5-6 hours, central and sharp / stabbing, radiating to her neck and left shoulder, that started at rest suddenly. PMH is significant for HTN, CAD, and chronic / recurrent chest pain. Her pain comes and goes and varies in severity, but has been up to a 9-10/10, with little relief with nitro and ASA in the ED. She has had chest pain in the past "many times," but never this bad. She did not have diaphoresis with this pain, cough, fever, or vomiting. She denies productive cough but does have some SOB, secondary to the pain, and states that her chest pain has been more of a "pressure" since getting to the ED. She states near the end of the interview, "I'm always in pain in my chest, but this is worse."  She does have some intermittent constipation and abdominal discomfort (not new) but no recent change in bowel habits. She does endorse neck pain and numbness / tingling in her fingers occasionally (few times per month) but doesn't know if this relates to her heart or chest pain or not.  Pt is seen at the [facility] in [place] and states she has an appointment to see a cardiologist (she thinks) in March due to "a blockage in her neck." She does endorse smoking, though has cut back from 2 packs per day to 2-3 cigarettes per day over the last  six months.  Review Of Systems: Per HPI. Otherwise 12 point review of systems was performed and was unremarkable.  Patient Active Problem List   Diagnosis Date Noted  . Syncope 10/26/2013  . 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 UTI (lower urinary tract infection) 10/26/2013  . Migraine headache 10/26/2013  . Chest pain 08/04/2013  . Hypertension 08/04/2013  . Hyponatremia 08/04/2013  . Hypokalemia 08/04/2013  . CAD (coronary artery  disease) 08/04/2013  . Tobacco use disorder 08/04/2013   Past Medical History: Past Medical History  Diagnosis Date  . Hypertension   . CAD (coronary artery disease)   . History of breast cancer   . History of stress test 07/2013    normal  . Chronic headache   . GERD (gastroesophageal reflux disease)   . Chronic chest pain   . History of stress test 2003    normal adenosine cardiolite study   Past Surgical History: Past Surgical History  Procedure Laterality Date  . Cesarean section    . Breast surgery      due to Cancer  . Throat surgery    . Brain surgery     Social History: History  Substance Use Topics  . Smoking status: Current Every Day Smoker -- 0.25 packs/day    Types: Cigarettes  . Smokeless tobacco: Not on file  . Alcohol Use: No   Additional social history: none Please also refer to relevant sections of EMR.  Family History: No family history on file. Allergies and Medications: No Known Allergies No current facility-administered medications on file prior to encounter.   Current Outpatient Prescriptions on File Prior to Encounter  Medication Sig Dispense Refill  . ALPRAZolam (XANAX) 1 MG tablet Take 1 mg by mouth at bedtime as needed for anxiety or sleep.      Marland Kitchen amLODipine (NORVASC) 10 MG tablet Take 10 mg by mouth daily.      Marland Kitchen aspirin EC 81 MG tablet Take 81 mg by mouth daily.      Marland Kitchen docusate sodium (COLACE) 100 MG capsule Take 200 mg by mouth daily.      Marland Kitchen HYDROcodone-acetaminophen (NORCO) 5-325 MG per tablet Take 1 tablet by mouth every 6 (six) hours as needed for severe pain.  10 tablet  0  . nitroGLYCERIN (NITROSTAT) 0.4 MG SL tablet Place 0.4 mg under the tongue every 5 (five) minutes as needed for chest pain.      . potassium chloride (K-DUR,KLOR-CON) 10 MEQ tablet Take 10 mEq by mouth daily.      . pravastatin (PRAVACHOL) 40 MG tablet Take 40 mg by mouth daily.        Objective: BP 106/59  Pulse 72  Temp(Src) 98.2 F (36.8 C) (Oral)   Resp 17  Ht 5\' 3"  (1.6 m)  Wt 150 lb (68.04 kg)  BMI 26.58 kg/m2  SpO2 93% Exam: General: elderly adult female, appears older than stated age, but in NAD HEENT: [place]/AT, EOMI, PERRLA, TM's clear, MMM Cardiovascular: RRR, no murmur appreciated  Markedly tender diffusely in anterior chest wall, reproducing pain Respiratory: CTAB, no wheezes, normal WOB Abdomen: soft, nontender, BS+; previous surgical scars well healed Extremities: warm, well-perfused, no LE edema  distal pulses 1+ / symmetric but more difficult to palpate in feet than in wrists Skin: warm, dry, intact Neuro: alert / oriented, no gross focal deficits  Labs and Imaging: CBC BMET   Recent Labs Lab 06/21/14 1440  WBC 6.8  HGB 12.8  HCT 37.9  PLT 215  Recent Labs Lab 06/21/14 1440  NA 139  K 3.7  CL 104  CO2 23  BUN 4*  CREATININE 0.63  GLUCOSE 142*  CALCIUM 8.9     Troponin: NEG x1 (POC), 10/30 @1450  Pro-BNP 10/30 @1440 : 81.9  EKG: NSR and no ST-T changes, normal R-wave progression  CXR 10/30 @1509  - no acute disease; bibasilar scarring / atelectasis  Emmaline Kluver, MD 06/21/2014, 6:35 PM PGY-3, [facility] pager: [phone], text pages welcome

## 2014-06-21 NOTE — Progress Notes (Signed)
Pt stated fentanyl doesn't help, GI cocktail given to pt. Report passed along to next shift

## 2014-06-21 NOTE — ED Provider Notes (Signed)
CSN: [id number]     Arrival date & time 06/21/14  1433 History   First MD Initiated Contact with Patient 06/21/14 1507     Chief Complaint  Patient presents with  . Chest Pain  . Palpitations     (Consider location/radiation/quality/duration/timing/severity/associated sxs/prior Treatment) HPI Dawn Mckay 63 year old female who presents emergency department with chief complaint of left-sided chest pain. She has a past medical history of hypertension, coronary artery disease and is a chronic daily smoker. Patient states that she was sitting and watching television when she had onset of left-sided chest pain that progressively worsened. She states that it was sharp and pressure-like and radiating into the left side of her neck. She states that she took her blood pressure and systolic was elevated to 696 and she had a pulse of 190. The patient is cared for at the [facility] in [facility]. Her son called them and told her to come to the emergency department. The patient took one nitroglycerin and 325 mg of aspirin prior to arrival. She states it did not help. She states that he normally does. She is frequently seen in the emergency department for her chest pain. Patient has a chronic daily cough but denies any hemoptysis, leg swelling. She denies history of heart failure. She denies any recent upper respiratory symptoms, fevers or chills.  Past Medical History  Diagnosis Date  . Hypertension   . CAD (coronary artery disease)   . History of breast cancer   . History of stress test 07/2013    normal  . Chronic headache   . GERD (gastroesophageal reflux disease)   . Chronic chest pain   . History of stress test 2003    normal adenosine cardiolite study   Past Surgical History  Procedure Laterality Date  . Cesarean section    . Breast surgery      due to Cancer  . Throat surgery    . Brain surgery     No family history on file. History  Substance Use Topics  .  Smoking status: Current Every Day Smoker -- 0.25 packs/day    Types: Cigarettes  . Smokeless tobacco: Not on file  . Alcohol Use: No   OB History   Grav Para Term Preterm Abortions TAB SAB Ect Mult Living                 Review of Systems  Ten systems reviewed and are negative for acute change, except as noted in the HPI.    Allergies  Review of patient's allergies indicates no known allergies.  Home Medications   Prior to Admission medications   Medication Sig Start Date End Date Taking? Authorizing Provider  ALPRAZolam [name]) 1 MG tablet Take 1 mg by mouth at bedtime as needed for anxiety or sleep.   Yes Historical Provider, MD  amLODipine (NORVASC) 10 MG tablet Take 10 mg by mouth daily.   Yes Historical Provider, MD  Ascorbic Acid (VITAMIN C PO) Take 1 tablet by mouth daily.   Yes Historical Provider, MD  aspirin EC 81 MG tablet Take 81 mg by mouth daily.   Yes Historical Provider, MD  Cholecalciferol (VITAMIN D PO) Take 1 tablet by mouth daily.   Yes Historical Provider, MD  docusate sodium (COLACE) 100 MG capsule Take 200 mg by mouth daily.   Yes Historical Provider, MD  HYDROcodone-acetaminophen (NORCO) 5-325 MG per tablet Take 1 tablet by mouth every 6 (six) hours as needed for severe pain. 05/18/14  Yes Orlie Dakin, MD  nitroGLYCERIN (NITROSTAT) 0.4 MG SL tablet Place 0.4 mg under the tongue every 5 (five) minutes as needed for chest pain.   Yes Historical Provider, MD  potassium chloride (K-DUR,KLOR-CON) 10 MEQ tablet Take 10 mEq by mouth daily.   Yes Historical Provider, MD  pravastatin (PRAVACHOL) 40 MG tablet Take 40 mg by mouth daily.   Yes Historical Provider, MD   BP 108/55  Pulse 79  Temp(Src) 98.2 F (36.8 C) (Oral)  Resp 18  Ht 5\' 3"  (1.6 m)  Wt 150 lb (68.04 kg)  BMI 26.58 kg/m2  SpO2 94% Physical Exam  Nursing note and vitals reviewed. Constitutional: She is oriented to person, place, and time. She appears well-developed and well-nourished. No  distress.  Smells of cigarettes.  HENT:  Head: Normocephalic and atraumatic.  Eyes: Conjunctivae are normal. No scleral icterus.  Neck: Normal range of motion.  Cardiovascular: Normal rate, regular rhythm and normal heart sounds.  Exam reveals no gallop and no friction rub.   No murmur heard. Pulmonary/Chest: Effort normal and breath sounds normal. No respiratory distress.  Abdominal: Soft. Bowel sounds are normal. She exhibits no distension and no mass. There is no tenderness. There is no guarding.  Neurological: She is alert and oriented to person, place, and time.  Skin: Skin is warm and dry. She is not diaphoretic.    ED Course  Procedures (including critical care time) Labs Review Labs Reviewed  [place], ED    Imaging Review Dg Chest Port 1 View  06/21/2014   CLINICAL DATA:  Left-sided chest pain, shortness of breath and palpitations. History of coronary artery disease, hypertension and current smoker.  EXAM: PORTABLE CHEST - 1 VIEW  COMPARISON:  05/28/2014 chest radiograph at [facility]  FINDINGS: The heart size and mediastinal contours are within normal limits. Stable bibasilar scarring. There is no evidence of pulmonary edema, consolidation, pneumothorax, nodule or pleural fluid. The visualized skeletal structures are unremarkable.  IMPRESSION: No active disease.  Stable bibasilar parenchymal scarring.   Electronically Signed   By: Aletta Edouard M.D.   On: 06/21/2014 15:13     EKG Interpretation   Date/Time:  Friday June 21 2014 14:41:07 EDT Ventricular Rate:  81 PR Interval:  130 QRS Duration: 79 QT Interval:  371 QTC Calculation: 431 R Axis:   17 Text Interpretation:  Sinus rhythm since last tracing no significant  change Confirmed by Eulis Foster  MD, ELLIOTT ([id number]) on 06/21/2014 3:11:49 PM      MDM   Final diagnoses:  Chest pain, unspecified chest pain type    3:40 PM BP 95/43   Pulse 86  Temp(Src) 98.2 F (36.8 C) (Oral)  Resp 19  Ht 5\' 3"  (1.6 m)  Wt 150 lb (68.04 kg)  BMI 26.58 kg/m2  SpO2 93% Patient here with complaint of chest pain. She's been seen previously and had negative workup in the past. EKG is unchanged from previous, her chest x-ray is negative for acute abnormality. Initial troponin is negative. Patient is hypotensive with systolic pressure in the 96P. Previous vitals reviewed and show variable pressures with systolic pressures in the low 100s to the high 100s.. She continues to complain of chest pain. It is not reproducible. Patient has received 500 mL saline. We'll give another 500.   4:44 PM Filed Vitals:   06/21/14 1452 06/21/14 1500 06/21/14 1530 06/21/14 1545  BP: 108/55 95/43 103/50 106/59  Pulse:  79 86 76 72  Temp:      TempSrc:      Resp:  19 13 17   Height:      Weight:      SpO2:  93% 93% 93%    Patient with soft pressures, uncontrolled pain and multiple risk factors. Initial troponin negative with unchanged EKG. Blood pressures are improving with fluids. I spoke with Dr. Aundra Dubin from family medicine who is agreed to admit the patient for chest pain rule out. No further nitroglycerin given due to soft pressures.    Margarita Mail, PA-C 06/21/14 [phone]

## 2014-06-21 NOTE — Progress Notes (Signed)
Pt arrived to floor stated she was in sever chest pain. EKG done and Dr. Darrick Grinder notified and stated that they ordered fentanyl for pain. They had just seen pt in ED and were aware of her complaints. EKG normal. Will continue to monitor and follow through with orders

## 2014-06-22 DIAGNOSIS — I2510 Atherosclerotic heart disease of native coronary artery without angina pectoris: Secondary | ICD-10-CM | POA: Diagnosis present

## 2014-06-22 DIAGNOSIS — R072 Precordial pain: Secondary | ICD-10-CM

## 2014-06-22 LAB — HEMOGLOBIN A1C
Hgb A1c MFr Bld: 5.5 % (ref <5.7)
MEAN PLASMA GLUCOSE: 111 mg/dL (ref <117)

## 2014-06-22 LAB — TROPONIN I

## 2014-06-22 MED ORDER — KETOROLAC TROMETHAMINE 15 MG/ML IJ SOLN
15.0000 mg | Freq: Four times a day (QID) | INTRAMUSCULAR | Status: DC | PRN
  Administered 2014-06-22 – 2014-06-23 (×5): 15 mg via INTRAVENOUS
  Filled 2014-06-22 (×5): qty 1

## 2014-06-22 MED ORDER — KETOROLAC TROMETHAMINE 15 MG/ML IJ SOLN
15.0000 mg | Freq: Once | INTRAMUSCULAR | Status: AC
  Administered 2014-06-22: 15 mg via INTRAVENOUS
  Filled 2014-06-22: qty 1

## 2014-06-22 NOTE — Consult Note (Addendum)
Name: Dawn Mckay is a 63 y.o. female Admit date: 06/21/2014 Referring Physician:  Family Medicine Teaching Service Primary Physician:  Unknown Primary Cardiologist:  [facility]  Reason for Consultation:  Chest pain  ASSESSMENT: 1. Left chest discomfort radiating through to the shoulder blade, characterized as sharp and nearly continuous for several weeks. Chest pressure superimposed on this discomfort led to admission yesterday. 2. Tachycardia lasting approximately 30 minutes but resolved before EMS arrived 3. Tobacco use 4. Hypertension, controlled 5. Hyperlipidemia 6. Coronary artery disease as documented by CT of chest Dec. 2014 7. Family history CAD, brother, early 25s  PLAN: 55. Myocardial perfusion study if markers remain negative. If elevation in markers, will need coronary angiography 2. Lipid panel and therapy as indicated 3. Smoking cessation 4. May need continuous ambulatory monitor 30 days to exclude the possibility of atrial fibrillation or other cardiac arrhythmia 5. All data needs to be sent to the [facility] with the patient receives her care   HPI: 63 year old female brought to the [facility] emergency department after developing fast heartbeat, and enhanced chest discomfort. The chest discomfort was described as pressure. The pressure quality to the discomfort is new. She has had a persistent chest discomfort described as sharp for several weeks. The sharp discomfort has been nearly continuous. It radiates to to the back and into her shoulder. The sharp discomfort is background present currently but not a great concern to the patient. The chest pressure has completely resolved.  PMH:   Past Medical History  Diagnosis Date  . Hypertension   . CAD (coronary artery disease)   . History of breast cancer   . History of stress test 07/2013    normal  . Chronic headache   . GERD (gastroesophageal reflux disease)   . Chronic chest pain   .  History of stress test 2003    normal adenosine cardiolite study    PSH:   Past Surgical History  Procedure Laterality Date  . Cesarean section    . Breast surgery      due to Cancer  . Throat surgery    . Brain surgery     Allergies:  Review of patient's allergies indicates no known allergies. Prior to Admit Meds:   Prescriptions prior to admission  Medication Sig Dispense Refill  . ALPRAZolam (XANAX) 1 MG tablet Take 1 mg by mouth at bedtime as needed for anxiety or sleep.      Marland Kitchen amLODipine (NORVASC) 10 MG tablet Take 10 mg by mouth daily.      . Ascorbic Acid (VITAMIN C PO) Take 1 tablet by mouth daily.      Marland Kitchen aspirin EC 81 MG tablet Take 81 mg by mouth daily.      . Cholecalciferol (VITAMIN D PO) Take 1 tablet by mouth daily.      Marland Kitchen docusate sodium (COLACE) 100 MG capsule Take 200 mg by mouth daily.      Marland Kitchen HYDROcodone-acetaminophen (NORCO) 5-325 MG per tablet Take 1 tablet by mouth every 6 (six) hours as needed for severe pain.  10 tablet  0  . nitroGLYCERIN (NITROSTAT) 0.4 MG SL tablet Place 0.4 mg under the tongue every 5 (five) minutes as needed for chest pain.      . potassium chloride (K-DUR,KLOR-CON) 10 MEQ tablet Take 10 mEq by mouth daily.      . pravastatin (PRAVACHOL) 40 MG tablet Take 40 mg by mouth daily.       Fam HX:  A brother who is 2 years her elder has had myocardial infarction. This occurred at age 58.  Social HX:    History   Social History  . Marital Status: Divorced    Spouse Name: N/A    Number of Children: N/A  . Years of Education: N/A   Occupational History  . Not on file.   Social History Main Topics  . Smoking status: Current Every Day Smoker -- 0.25 packs/day    Types: Cigarettes  . Smokeless tobacco: Not on file  . Alcohol Use: No  . Drug Use: No  . Sexual Activity: Not on file   Other Topics Concern  . Not on file   Social History Narrative  . No narrative on file     Review of Systems: There is no history of heart disease.  She denies CVA, claudication, orthopnea, PND, cough, and syncope.  Physical Exam: Blood pressure 122/68, pulse 66, temperature 98.2 F (36.8 C), temperature source Oral, resp. rate 19, height 5\' 3"  (1.6 m), weight 152 lb (68.947 kg), SpO2 98.00%. Weight change:    Lying flat in her bed in no distress. Skin: Slightly pale Skin is warm and dry No JVD is noted with patient lying at 45. No carotid bruits are heard. Chest clear to auscultation and percussion Cardiac exam reveals no rub, gallop, murmur, or click. Abdomen soft. Liver is not palpable. No bruits are heard. Extremities reveal no edema. Radial pulses and femoral pulses are 2+. Neurological exam reveals no focal deficits. Psychiatric exam reveals an expressionless face. She is conversant. Labs: Lab Results  Component Value Date   WBC 6.8 06/21/2014   HGB 12.8 06/21/2014   HCT 37.9 06/21/2014   MCV 92.4 06/21/2014   PLT 215 06/21/2014     Lab Results  Component Value Date   TROPONINI <0.30 06/22/2014     Lab Results  Component Value Date   CHOL 182 06/21/2014   Lab Results  Component Value Date   HDL 70 06/21/2014   Lab Results  Component Value Date   LDLCALC 93 06/21/2014   Lab Results  Component Value Date   TRIG 94 06/21/2014   Lab Results  Component Value Date   CHOLHDL 2.6 06/21/2014   No results found for this basename: LDLDIRECT      Radiology:  Dg Chest Port 1 View  06/21/2014   CLINICAL DATA:  Left-sided chest pain, shortness of breath and palpitations. History of coronary artery disease, hypertension and current smoker.  EXAM: PORTABLE CHEST - 1 VIEW  COMPARISON:  05/28/2014 chest radiograph at [facility].  FINDINGS: The heart size and mediastinal contours are within normal limits. Stable bibasilar scarring. There is no evidence of pulmonary edema, consolidation, pneumothorax, nodule or pleural fluid. The visualized skeletal structures are unremarkable.  IMPRESSION: No active disease.   Stable bibasilar parenchymal scarring.   Electronically Signed   By: Aletta Edouard M.D.   On: 06/21/2014 15:13    EKG:  Normal  CT scan, chest:  December 2014  Significant for aortic atherosclerotic and LAD calcification.  Sinclair Grooms 06/22/2014 2:40 PM

## 2014-06-22 NOTE — H&P (Signed)
FMTS Attending Admit Note Patient seen and examined by me, discussed with resident team and I agree with Dr Ebony Hail plan for admission as documented. Briefly, a 83yoF who receives her routine care at the [facility], presenting to ED via EMS for severe crushing chest pain, accompanied by nausea and dyspnea.  She reports that she has had chronic persistent chest pain which has prompted her to make a cardiology appointment at the [facility], which is scheduled for March, 2016.  This morning she reports resolution of the crushing chest pain she had yesterday, as well as resolution of the dyspnea and nausea.  She does continue to have her baseline central chest pain, which she rates as a 3/10.  Thus far, Troponin I negative x2.  ECG without findings suggestive of ACS event.   On exam she is awake and alert, appears anxious, but in no acute distress. Positioning breakfast tray to eat.  Neck supple. No appreciable JVD.  COR regular S1S2, without extra sounds PULM Clear bilaterally. ABD Soft, nontender.  No appreciable pitting edema in ankles.   A/P: Patient with episode of acute chest pain with nausea/dyspnea/palpitations that overlies a baseline chronic chest pain which has MSK characteristics.  No evidence of ACS event. Agree with ECHO. Would not pursue carotid dopplers in absence of symptoms of TIA/CVA. Would consult Cardiology to evaluate, to establish a reasonable time frame for further cardiac evaluation.  While her baseline chest pain seems to lack anginal characteristics, yesterday's event sounds quite different from her usual pain. Continue ASA, pravachol, amlodipine. Dalbert Mayotte, MD

## 2014-06-22 NOTE — Progress Notes (Signed)
UR completed 

## 2014-06-22 NOTE — ED Provider Notes (Signed)
Medical screening examination/treatment/procedure(s) were performed by non-physician practitioner and as supervising physician I was immediately available for consultation/collaboration.   EKG Interpretation   Date/Time:  Friday June 21 2014 14:41:07 EDT Ventricular Rate:  81 PR Interval:  130 QRS Duration: 79 QT Interval:  371 QTC Calculation: 431 R Axis:   17 Text Interpretation:  Sinus rhythm since last tracing no significant  change Confirmed by Eulis Foster  MD, ELLIOTT [phone]) on 06/21/2014 3:11:49 PM        [place], DO 06/22/14 0001

## 2014-06-22 NOTE — Progress Notes (Signed)
FMTS Attending Note Patient seen and examined by me, discussed with resident team and I agree with Dr Ebony Hail note for today.  Dalbert Mayotte, MD

## 2014-06-22 NOTE — Progress Notes (Signed)
  Echocardiogram 2D Echocardiogram has been performed.  Dawn Mckay FRANCES 06/22/2014, 12:20 PM

## 2014-06-22 NOTE — Progress Notes (Addendum)
Pt BP 93/48 in R arm at 0019, 87/42 in R arm at 0157, and 118/74 in L arm at 0203. Pt states chest pain and headache at 4 unrelieved by NTG or Fentanyl previously. Pt requested PRN tylenol. MD notified of results, MD Bacigalupo advised she would come see the patient. Will continue to monitor closely.  MD ordered ketorolac for pain and consult with Cardiology.

## 2014-06-22 NOTE — Progress Notes (Signed)
Family Medicine Teaching Service Daily Progress Note Intern Pager: [phone]  Patient name: Dawn Mckay Medical record number: [id number] Date of birth: 08-Jun-1951 Age: 63 y.o. Gender: female  Primary Care Provider: No PCP Per Patient ([place]) Consultants: cardiology  Code Status: FULL  Pt Overview and Major Events to Date:  10/30 - admit, acute on chronic chest pain, troponins and EKG normal, but nitro / Fentanyl infective 10/31 - Toradol helpful for pain, but still present; cardiology consulted  Assessment and Plan: Dawn Mckay is a 63 y.o. female presenting with acute on chronic chest pain. PMH is significant for HTN, known CAD, anxiety.   # Chest pain - acute on chronic, anginal in description, HEART score is 4 (H1, A1, R2) with known CAD  - Wells score for PE 1.5 so doubt PE; troponins negative so far and repeat EKG normal, doubt MI - HPI, exam, and history per chart review suggests chronic / recurrent MSK-type pain (?component of neck pain / radiculopathy with intermittent hand tingling) +/- component of anxiety  - echocardiogram pending - improvement with Toradol IV --> continue q6 PRN - continue Fentanyl PRN as well - cardiology consulted 10/31, appreciate recs - risk stratification labs: lipid panel, TSH, and A1c all unremarkable  # HTN / CAD - per history, on ASA, Norvasc at home; questionable history of ?carotid blockage ("blockage in my neck" per pt) and with BP disparity between arms (left arm reads ~10-20 points higher) - strongly doubt serious CV pathology such as dissection causing disparate BP, so hold on CTA or similar scan for now - BP apparently well-controlled, continue Norvasc and monitor  - continue daily ASA  - ordered for echocardiogram, cardiology recs pending as above (appreciate assistance)  # HLD - on pravachol at home, 40 mg daily, lipid panel looks appropriate --> continue statin   # Anxiety, Chronic pain - pain management as above, plus  continued home Xanax PRN at bedtime   # Tobacco abuse - encouraged complete cessation   FEN/GI: heart-healthy diet, saline lock IV  Prophylaxis: Lovenox   Disposition: management as above; possible D/C later today after cardiology eval  Subjective: Pt still reports some pain but it is "better," especially with Toradol overnight. Denies frank SOB, still with a little nausea and poor appetite.  Objective: Temp:  [97.7 F (36.5 C)-98.3 F (36.8 C)] 98.3 F (36.8 C) (10/31 0801) Pulse Rate:  [57-87] 61 (10/31 0803) Resp:  [13-25] 18 (10/31 0803) BP: (87-125)/(42-59) 108/49 mmHg (10/31 0803) SpO2:  [93 %-99 %] 97 % (10/31 0819) Weight:  [150 lb (68.04 kg)-152 lb (68.947 kg)] 152 lb (68.947 kg) (10/31 0341) Physical Exam: General: elderly adult female, appears older than stated age, but in NAD  Cardiovascular: RRR, no murmur appreciated   Remains markedly tender diffusely in anterior chest wall (pain reproducible)  Respiratory: CTAB, no wheezes, normal WOB  Abdomen: soft, nontender, BS+; previous surgical scars well healed  Extremities: warm, well-perfused, no LE edema   distal pulses 1+ / symmetric but more difficult to palpate in feet than in wrists  Skin: warm, dry, intact   Laboratory:  Recent Labs Lab 06/21/14 1440  WBC 6.8  HGB 12.8  HCT 37.9  PLT 215    Recent Labs Lab 06/21/14 1440  NA 139  K 3.7  CL 104  CO2 23  BUN 4*  CREATININE 0.63  CALCIUM 8.9  GLUCOSE 142*    Recent Labs Lab 06/21/14 1942 06/22/14 0146  TROPONINI <0.30 <0.30   Imaging/Diagnostic  Tests: ED and repeat AM EKG's both normal CXR 10/30 @1509 : no acute active disease process 2D echocardiogram: PENDING  Emmaline Kluver, MD 06/22/2014, 9:14 AM PGY-3, [place] Intern pager: [phone], text pages welcome

## 2014-06-23 ENCOUNTER — Observation Stay (HOSPITAL_COMMUNITY): Payer: Self-pay

## 2014-06-23 DIAGNOSIS — R079 Chest pain, unspecified: Secondary | ICD-10-CM

## 2014-06-23 DIAGNOSIS — Z720 Tobacco use: Secondary | ICD-10-CM

## 2014-06-23 DIAGNOSIS — R0782 Intercostal pain: Secondary | ICD-10-CM

## 2014-06-23 DIAGNOSIS — I10 Essential (primary) hypertension: Secondary | ICD-10-CM

## 2014-06-23 DIAGNOSIS — I25110 Atherosclerotic heart disease of native coronary artery with unstable angina pectoris: Secondary | ICD-10-CM

## 2014-06-23 DIAGNOSIS — I25118 Atherosclerotic heart disease of native coronary artery with other forms of angina pectoris: Secondary | ICD-10-CM

## 2014-06-23 MED ORDER — MELOXICAM 15 MG PO TABS: 15.0000 mg | ORAL_TABLET | Freq: Every day | ORAL | Status: DC | PRN

## 2014-06-23 MED ORDER — TECHNETIUM TC 99M SESTAMIBI GENERIC - CARDIOLITE
10.0000 | Freq: Once | INTRAVENOUS | Status: AC | PRN
  Administered 2014-06-23: 10 via INTRAVENOUS

## 2014-06-23 MED ORDER — ACETAMINOPHEN 325 MG PO TABS
ORAL_TABLET | ORAL | Status: AC
  Filled 2014-06-23: qty 2

## 2014-06-23 MED ORDER — TECHNETIUM TC 99M SESTAMIBI GENERIC - CARDIOLITE
30.0000 | Freq: Once | INTRAVENOUS | Status: AC | PRN
  Administered 2014-06-23: 30 via INTRAVENOUS

## 2014-06-23 MED ORDER — MORPHINE SULFATE 30 MG PO TABS: 30.0000 mg | ORAL_TABLET | ORAL | Status: DC | PRN

## 2014-06-23 MED ORDER — REGADENOSON 0.4 MG/5ML IV SOLN
INTRAVENOUS | Status: AC
Start: 2014-06-23 — End: 2014-06-23
  Administered 2014-06-23: 0.4 mg via INTRAVENOUS
  Filled 2014-06-23: qty 5

## 2014-06-23 MED ORDER — REGADENOSON 0.4 MG/5ML IV SOLN
0.4000 mg | Freq: Once | INTRAVENOUS | Status: AC
  Administered 2014-06-23: 0.4 mg via INTRAVENOUS
  Filled 2014-06-23: qty 5

## 2014-06-23 NOTE — Progress Notes (Signed)
Family Medicine Teaching Service Daily Progress Note Intern Pager: [phone]  Patient name: Dawn Mckay Medical record number: [id number] Date of birth: October 20, 1950 Age: 63 y.o. Gender: female  Primary Care Provider: No PCP Per Patient ([place]) Consultants: cardiology  Code Status: FULL  Pt Overview and Major Events to Date:  10/30 - admit, acute on chronic chest pain, troponins and EKG normal, but nitro / Fentanyl infective 10/31 - Toradol helpful for pain, but still present; cardiology consulted  Assessment and Plan: Dawn Mckay is a 63 y.o. female presenting with acute on chronic chest pain. PMH is significant for HTN, known CAD, anxiety.   # Chest pain - acute on chronic, anginal in description, HEART score is 4 (H1, A1, R2) with known CAD  - Wells score for PE 1.5 so doubt PE; troponins negative and repeat EKG normal, doubt MI - HPI, exam, and history per chart review suggests chronic / recurrent MSK-type pain (?component of neck pain / radiculopathy with intermittent hand tingling) +/- component of anxiety  - echocardiogram normal (no systolic or diastolic dysfunction) - improvement with Toradol IV --> continue q6 PRN - continue Fentanyl PRN as well - cardiology consulted 10/31,getting myoview study today - risk stratification labs: lipid panel, TSH, and A1c all unremarkable  # HTN / CAD - per history, on ASA, Norvasc at home; questionable history of ?carotid blockage ("blockage in my neck" per pt) and with BP disparity between arms (left arm reads ~10-20 points higher) - strongly doubt serious CV pathology such as dissection causing disparate BP, so hold on CTA or similar scan for now - BP apparently well-controlled, continue Norvasc and monitor  - continue daily ASA   # HLD - on pravachol at home, 40 mg daily, lipid panel looks appropriate --> continue statin   # Anxiety, Chronic pain - pain management as above, plus continued home Xanax PRN at bedtime   #  Tobacco abuse - encouraged complete cessation   FEN/GI: heart-healthy diet, saline lock IV  Prophylaxis: Lovenox   Disposition: management as above; d/c pending myoview results & cardiology recommendations  Subjective: unable to ask - getting myoview  Objective: Temp:  [98 F (36.7 C)-98.2 F (36.8 C)] 98 F (36.7 C) (11/01 0500) Pulse Rate:  [53-66] 53 (11/01 0500) Resp:  [18-19] 18 (11/01 0500) BP: (117-124)/(56-68) 117/56 mmHg (11/01 0500) SpO2:  [97 %-99 %] 99 % (11/01 0500) Weight:  [152 lb 9.6 oz (69.219 kg)] 152 lb 9.6 oz (69.219 kg) (11/01 0500) Physical Exam: Unable to examine - getting myoview  Laboratory:  Recent Labs Lab 06/21/14 1440  WBC 6.8  HGB 12.8  HCT 37.9  PLT 215    Recent Labs Lab 06/21/14 1440  NA 139  K 3.7  CL 104  CO2 23  BUN 4*  CREATININE 0.63  CALCIUM 8.9  GLUCOSE 142*    Recent Labs Lab 06/21/14 1942 06/22/14 0146  TROPONINI <0.30 <0.30   Imaging/Diagnostic Tests: ED and repeat AM EKG's both normal CXR 10/30 @1509 : no acute active disease process 2D echocardiogram: normal  Leeanne Rio, MD 06/23/2014, 8:58 AM PGY-3, [place] Intern pager: [phone], text pages welcome

## 2014-06-23 NOTE — Progress Notes (Signed)
S: Still having chest pain that is reducible with palpation. Stress test negative. Wants to go home tonight if she can.   O: General: elderly adult female, appears older than stated age, but in NAD  Cardiovascular: RRR, no murmur appreciated, remains markedly tender in anterior chest wall Respiratory: CTAB, no wheezes, normal WOB  Abdomen: soft, nontender  Extremities: warm, well-perfused, no LE edema  Skin: warm, dry, intact   A/P:  Patient with negative stress test, ruled out for MI. Will discharge home now if able to get a ride home. Mobic for likely costochondritis. [facility], still likely needs to see a vascular surgeon about her asymmetric UE BPs.

## 2014-06-23 NOTE — Plan of Care (Signed)
Problem: Phase I Progression Outcomes Goal: Hemodynamically stable Outcome: Completed/Met Date Met:  06/23/14 Goal: Voiding-avoid urinary catheter unless indicated Outcome: Completed/Met Date Met:  06/23/14  Problem: Phase II Progression Outcomes Goal: Hemodynamically stable Outcome: Completed/Met Date Met:  06/23/14     

## 2014-06-23 NOTE — Discharge Summary (Signed)
[facility] Discharge Summary  Patient name: Dawn Mckay Medical record number: [id number] Date of birth: 20-Mar-1951 Age: 63 y.o. Gender: female Date of Admission: 06/21/2014  Date of Discharge: 06/23/2014  Admitting Physician: Willeen Niece, MD  Primary Care Provider: No PCP Per Patient Consultants: cardiology  Indication for Hospitalization: chest pain  Discharge Diagnoses/Problem List:  Patient Active Problem List   Diagnosis Date Noted  . Essential hypertension   . CAD (coronary artery disease), native coronary artery 06/22/2014  . Syncope 10/26/2013  . 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 UTI (lower urinary tract infection) 10/26/2013  . Migraine headache 10/26/2013  . Chest pain 08/04/2013  . Hypertension 08/04/2013  . Hyponatremia 08/04/2013  . Hypokalemia 08/04/2013  . Tobacco use disorder 08/04/2013     Disposition: home  Discharge Condition: improved  Discharge Exam:  General: elderly adult female, appears older than stated age, but in NAD  Cardiovascular: RRR, no murmur appreciated, remains markedly tender in anterior chest wall Respiratory: CTAB, no wheezes, normal WOB  Abdomen: soft, nontender  Extremities: warm, well-perfused, no LE edema  Skin: warm, dry, intact   Brief Hospital Course:  Pt is a 63yo w/ h/o CAD, HTN who presented with acute on chronic chest pain that is sharp in character and reproducible with palpation. Pt had negative troponins and a normal echo. Cardiology was consulted and did a stress test which was normal. The patient's pain was somewhat relieved with NSAIDS. She was discharged with mobic for presumed costochondritis.  Issues for Follow Up:  # CP: adequate pain control # Asymmetric blood pressure: Pt has consistently abnormal variation between BP in her right and left arm that may warrant further vascular evaluation.  Significant Procedures: myoview  Significant Labs and Imaging:   Recent  Labs Lab 06/21/14 1440  WBC 6.8  HGB 12.8  HCT 37.9  PLT 215    Recent Labs Lab 06/21/14 1440  NA 139  K 3.7  CL 104  CO2 23  GLUCOSE 142*  BUN 4*  CREATININE 0.63  CALCIUM 8.9   Troponin neg x2 ProBNP 81.9  Myoview: 1. No reversible ischemia or infarction. 2. Normal left ventricular wall motion. 3. Left ventricular ejection fraction 75% 4. Low-risk stress test findings  Echo: Left ventricle: The cavity size was normal. Systolic function was normal. The estimated ejection fraction was in the range of 55% to 60%. Wall motion was normal; there were no regional wall motion abnormalities. Left ventricular diastolic function parameters were normal.   Results/Tests Pending at Time of Discharge: none  Discharge Medications:    Medication List    STOP taking these medications        HYDROcodone-acetaminophen 5-325 MG per tablet  Commonly known as:  [place] these medications        ALPRAZolam 1 MG tablet  Commonly known as:  XANAX  Take 1 mg by mouth at bedtime as needed for anxiety or sleep.     amLODipine 10 MG tablet  Commonly known as:  NORVASC  Take 10 mg by mouth daily.     aspirin EC 81 MG tablet  Take 81 mg by mouth daily.     docusate sodium 100 MG capsule  Commonly known as:  COLACE  Take 200 mg by mouth daily.     meloxicam 15 MG tablet  Commonly known as:  MOBIC  Take 1 tablet (15 mg total) by mouth daily as needed for pain.  morphine 30 MG tablet  Commonly known as:  MSIR  Take 1 tablet (30 mg total) by mouth every 4 (four) hours as needed for severe pain.     nitroGLYCERIN 0.4 MG SL tablet  Commonly known as:  NITROSTAT  Place 0.4 mg under the tongue every 5 (five) minutes as needed for chest pain.     potassium chloride 10 MEQ tablet  Commonly known as:  K-DUR,KLOR-CON  Take 10 mEq by mouth daily.     pravastatin 40 MG tablet  Commonly known as:  PRAVACHOL  Take 40 mg by mouth daily.     VITAMIN C PO  Take 1 tablet  by mouth daily.     VITAMIN D PO  Take 1 tablet by mouth daily.        Discharge Instructions: Please refer to Patient Instructions section of EMR for full details.  Patient was counseled important signs and symptoms that should prompt return to medical care, changes in medications, dietary instructions, activity restrictions, and follow up appointments.   Follow-Up Appointments:     Follow-up Information    [facility] Schedule an appointment as soon as possible for a visit in 1 week.      Frazier Richards, MD MPH 06/23/2014, 8:01 PM PGY-2, [place]

## 2014-06-23 NOTE — Plan of Care (Signed)
Problem: Discharge Progression Outcomes Goal: No anginal pain Outcome: Adequate for Discharge PT HAS CHRONIC CHEST PAIN REPRODUCED WITH TACTILE STIMULATION Goal: Hemodynamically stable Outcome: Completed/Met Date Met:  06/23/14 Goal: Discharge plan in place and appropriate Outcome: Completed/Met Date Met:  06/23/14 Goal: Vascular site scale level 0 - I Vascular Site Scale Level 0: No bruising/bleeding/hematoma Level I (Mild): Bruising/Ecchymosis, minimal bleeding/ooozing, palpable hematoma < 3 cm Level II (Moderate): Bleeding not affecting hemodynamic parameters, pseudoaneurysm, palpable hematoma > 3 cm Level III (Severe) Bleeding which affects hemodynamic parameters or retroperitoneal hemorrhage  Outcome: Not Applicable Date Met:  35/59/74 Goal: Tolerates diet Outcome: Completed/Met Date Met:  06/23/14 Goal: Activity appropriate for discharge plan Outcome: Completed/Met Date Met:  06/23/14

## 2014-06-23 NOTE — Progress Notes (Signed)
Chest pain pre Lexiscan 2-3/10. States always has chest pain. Post Lexiscan chest pain 4/10. Kirt Boys PA aware

## 2014-06-23 NOTE — Progress Notes (Signed)
Utilization review completed.  

## 2014-06-23 NOTE — Progress Notes (Signed)
Patient Name: Pegeen Stiger Date of Encounter: 06/23/2014     Principal Problem:   Chest pain Active Problems:   Hypertension   Tobacco use disorder   CAD (coronary artery disease), native coronary artery    SUBJECTIVE  Seen in nuclear med for lexiscan myoview. Tolerated procedure well. Still with low level, chronic chest pain   CURRENT MEDS . amLODipine  10 mg Oral Daily  . aspirin EC  81 mg Oral Daily  . enoxaparin (LOVENOX) injection  40 mg Subcutaneous Q24H  . potassium chloride  10 mEq Oral Daily  . pravastatin  40 mg Oral Daily  . regadenoson      . regadenoson  0.4 mg Intravenous Once    OBJECTIVE  Filed Vitals:   06/22/14 0819 06/22/14 1357 06/22/14 2100 06/23/14 0500  BP:  122/68 124/63 117/56  Pulse:  66 66 53  Temp:  98.2 F (36.8 C) 98.1 F (36.7 C) 98 F (36.7 C)  TempSrc:  Oral    Resp:  19 18 18   Height:      Weight:    152 lb 9.6 oz (69.219 kg)  SpO2: 97% 98% 97% 99%    Intake/Output Summary (Last 24 hours) at 06/23/14 1027 Last data filed at 06/22/14 2000  Gross per 24 hour  Intake    120 ml  Output      0 ml  Net    120 ml   Filed Weights   06/22/14 0000 06/22/14 0341 06/23/14 0500  Weight: 152 lb (68.947 kg) 152 lb (68.947 kg) 152 lb 9.6 oz (69.219 kg)    PHYSICAL EXAM  General: Pleasant, NAD. Appears older than her stated age Neuro: Alert and oriented X 3. Moves all extremities spontaneously. Psych: Normal affect. HEENT:  Normal  Neck: Supple without bruits or JVD. Lungs:  Resp regular and unlabored, CTA. Heart: RRR no s3, s4, or murmurs. Abdomen: Soft, non-tender, non-distended, BS + x 4.  Extremities: No clubbing, cyanosis or edema. DP/PT/Radials 2+ and equal bilaterally.  Accessory Clinical Findings  CBC  Recent Labs  06/21/14 1440  WBC 6.8  HGB 12.8  HCT 37.9  MCV 92.4  PLT 163   Basic Metabolic Panel  Recent Labs  06/21/14 1440  NA 139  K 3.7  CL 104  CO2 23  GLUCOSE 142*  BUN 4*  CREATININE 0.63   CALCIUM 8.9    Cardiac Enzymes  Recent Labs  06/21/14 1942 06/22/14 0146  TROPONINI <0.30 <0.30    Hemoglobin A1C  Recent Labs  06/21/14 1942  HGBA1C 5.5   Fasting Lipid Panel  Recent Labs  06/21/14 1942  CHOL 182  HDL 70  LDLCALC 93  TRIG 94  CHOLHDL 2.6   Thyroid Function Tests  Recent Labs  06/21/14 1942  TSH 1.130    TELE  NSR  Radiology/Studies  Dg Chest Port 1 View  06/21/2014   CLINICAL DATA:  Left-sided chest pain, shortness of breath and palpitations. History of coronary artery disease, hypertension and current smoker.  EXAM: PORTABLE CHEST - 1 VIEW  COMPARISON:  05/28/2014 chest radiograph at [facility].  FINDINGS: The heart size and mediastinal contours are within normal limits. Stable bibasilar scarring. There is no evidence of pulmonary edema, consolidation, pneumothorax, nodule or pleural fluid. The visualized skeletal structures are unremarkable.  IMPRESSION: No active disease.  Stable bibasilar parenchymal scarring.   Electronically Signed   By: Aletta Edouard M.D.   On: 06/21/2014 15:13    ASSESSMENT AND PLAN  Judeth Gilles is a 63 y.o. female with a history of tobacco abuse, HLD, HTN, CAD documented by CT chest, and anxiety who presented to [facility] on 06/21/14 with acute on chronic chest pain and palpitations.   Chest pain-  -- Troponin neg x2  -- Await nuclear stress test images.  -- May need continuous ambulatory monitor 30 days to exclude the possibility of atrial fibrillation or other cardiac arrhythmia  Tobacco abuse- she has cut down a lot but not completely quit. Advised complete cessation.   HLD- well controlled on pravastatin -- LDL 93  HTN - continue Norvasc 10mg   Signed, Eileen Stanford PA-C  Pager [phone]  I have seen and examined the patient along with Eileen Stanford PA-C I have reviewed the chart, notes and new data.  I agree with PA's note.  Key new complaints: symptoms are atypical, but often  activity related Key examination changes: no CHF/arrhythmia Key new findings / data: low risk nuclear scan  PLAN: She does not need further cardiac evaluation at this time, but there is a chance that she truly has angina pectoris. At this time, the pattern is clearly stable/exertional and mild. If there is a pattern of symptom acceleration or prolonged chest discomfort (>30 min at a time) she should seek urgent attention. No need for invasive evaluation at this time. Otherwise focus on risk factor modification. An outpatient arrhythmia monitor is appropriate.  Sanda Klein, MD, [place] [phone] 06/23/2014, 4:47 PM

## 2014-07-10 ENCOUNTER — Encounter (HOSPITAL_COMMUNITY): Payer: Self-pay | Admitting: Emergency Medicine

## 2014-07-10 ENCOUNTER — Emergency Department (HOSPITAL_COMMUNITY)
Admission: EM | Admit: 2014-07-10 | Discharge: 2014-07-10 | Discharge status: Against Medical Advice | Payer: MEDICAID | Attending: Emergency Medicine | Admitting: Emergency Medicine

## 2014-07-10 DIAGNOSIS — I10 Essential (primary) hypertension: Secondary | ICD-10-CM | POA: Insufficient documentation

## 2014-07-10 DIAGNOSIS — G8929 Other chronic pain: Secondary | ICD-10-CM | POA: Insufficient documentation

## 2014-07-10 DIAGNOSIS — Z720 Tobacco use: Secondary | ICD-10-CM | POA: Insufficient documentation

## 2014-07-10 DIAGNOSIS — I2510 Atherosclerotic heart disease of native coronary artery without angina pectoris: Secondary | ICD-10-CM | POA: Insufficient documentation

## 2014-07-10 DIAGNOSIS — R110 Nausea: Secondary | ICD-10-CM | POA: Insufficient documentation

## 2014-07-10 DIAGNOSIS — R002 Palpitations: Secondary | ICD-10-CM | POA: Insufficient documentation

## 2014-07-10 LAB — CBC WITH DIFFERENTIAL/PLATELET
BASOS ABS: 0 10*3/uL (ref 0.0–0.1)
Basophils Relative: 0 % (ref 0–1)
EOS PCT: 1 % (ref 0–5)
Eosinophils Absolute: 0.1 10*3/uL (ref 0.0–0.7)
HEMATOCRIT: 39.7 % (ref 36.0–46.0)
Hemoglobin: 13.4 g/dL (ref 12.0–15.0)
LYMPHS PCT: 20 % (ref 12–46)
Lymphs Abs: 2 10*3/uL (ref 0.7–4.0)
MCH: 30.4 pg (ref 26.0–34.0)
MCHC: 33.8 g/dL (ref 30.0–36.0)
MCV: 90 fL (ref 78.0–100.0)
MONO ABS: 0.7 10*3/uL (ref 0.1–1.0)
Monocytes Relative: 7 % (ref 3–12)
NEUTROS ABS: 6.8 10*3/uL (ref 1.7–7.7)
Neutrophils Relative %: 72 % (ref 43–77)
Platelets: 246 10*3/uL (ref 150–400)
RBC: 4.41 MIL/uL (ref 3.87–5.11)
RDW: 13.6 % (ref 11.5–15.5)
WBC: 9.6 10*3/uL (ref 4.0–10.5)

## 2014-07-10 LAB — COMPREHENSIVE METABOLIC PANEL
ALT: 12 U/L (ref 0–35)
AST: 17 U/L (ref 0–37)
Albumin: 3.9 g/dL (ref 3.5–5.2)
Alkaline Phosphatase: 62 U/L (ref 39–117)
Anion gap: 14 (ref 5–15)
BILIRUBIN TOTAL: 0.3 mg/dL (ref 0.3–1.2)
BUN: 8 mg/dL (ref 6–23)
CHLORIDE: 97 meq/L (ref 96–112)
CO2: 20 mEq/L (ref 19–32)
CREATININE: 0.6 mg/dL (ref 0.50–1.10)
Calcium: 9.3 mg/dL (ref 8.4–10.5)
GFR calc non Af Amer: >90 mL/min (ref >90)
Glucose, Bld: 139 mg/dL — ABNORMAL HIGH (ref 70–99)
Potassium: 3.9 mEq/L (ref 3.7–5.3)
SODIUM: 131 meq/L — AB (ref 137–147)
Total Protein: 7.3 g/dL (ref 6.0–8.3)

## 2014-07-10 NOTE — ED Notes (Signed)
Pt. reports elevated blood pressure at home this evening 212/137 with intermittent palpitations and nausea. Respirations unlabored .

## 2014-09-25 ENCOUNTER — Emergency Department (HOSPITAL_COMMUNITY)
Admission: EM | Admit: 2014-09-25 | Discharge: 2014-09-25 | Disposition: A | Discharge status: Home / Self Care | Payer: Non-veteran care | Attending: Emergency Medicine | Admitting: Emergency Medicine

## 2014-09-25 ENCOUNTER — Emergency Department (HOSPITAL_COMMUNITY): Payer: Non-veteran care

## 2014-09-25 ENCOUNTER — Encounter (HOSPITAL_COMMUNITY): Payer: Self-pay

## 2014-09-25 DIAGNOSIS — Z8719 Personal history of other diseases of the digestive system: Secondary | ICD-10-CM | POA: Insufficient documentation

## 2014-09-25 DIAGNOSIS — G8929 Other chronic pain: Secondary | ICD-10-CM | POA: Insufficient documentation

## 2014-09-25 DIAGNOSIS — Z720 Tobacco use: Secondary | ICD-10-CM | POA: Insufficient documentation

## 2014-09-25 DIAGNOSIS — R0789 Other chest pain: Secondary | ICD-10-CM | POA: Insufficient documentation

## 2014-09-25 DIAGNOSIS — Z853 Personal history of malignant neoplasm of breast: Secondary | ICD-10-CM | POA: Insufficient documentation

## 2014-09-25 DIAGNOSIS — R079 Chest pain, unspecified: Secondary | ICD-10-CM

## 2014-09-25 DIAGNOSIS — I10 Essential (primary) hypertension: Secondary | ICD-10-CM | POA: Insufficient documentation

## 2014-09-25 DIAGNOSIS — I2510 Atherosclerotic heart disease of native coronary artery without angina pectoris: Secondary | ICD-10-CM | POA: Insufficient documentation

## 2014-09-25 DIAGNOSIS — Z79899 Other long term (current) drug therapy: Secondary | ICD-10-CM | POA: Insufficient documentation

## 2014-09-25 DIAGNOSIS — Z7982 Long term (current) use of aspirin: Secondary | ICD-10-CM | POA: Insufficient documentation

## 2014-09-25 LAB — CBC WITH DIFFERENTIAL/PLATELET
Basophils Absolute: 0 10*3/uL (ref 0.0–0.1)
Basophils Relative: 0 % (ref 0–1)
Eosinophils Absolute: 0.1 10*3/uL (ref 0.0–0.7)
Eosinophils Relative: 1 % (ref 0–5)
HEMATOCRIT: 35.9 % — AB (ref 36.0–46.0)
Hemoglobin: 12.4 g/dL (ref 12.0–15.0)
LYMPHS ABS: 2.5 10*3/uL (ref 0.7–4.0)
Lymphocytes Relative: 36 % (ref 12–46)
MCH: 31.1 pg (ref 26.0–34.0)
MCHC: 34.5 g/dL (ref 30.0–36.0)
MCV: 90 fL (ref 78.0–100.0)
Monocytes Absolute: 0.5 10*3/uL (ref 0.1–1.0)
Monocytes Relative: 6 % (ref 3–12)
Neutro Abs: 3.9 10*3/uL (ref 1.7–7.7)
Neutrophils Relative %: 57 % (ref 43–77)
PLATELETS: 205 10*3/uL (ref 150–400)
RBC: 3.99 MIL/uL (ref 3.87–5.11)
RDW: 12.8 % (ref 11.5–15.5)
WBC: 7 10*3/uL (ref 4.0–10.5)

## 2014-09-25 LAB — COMPREHENSIVE METABOLIC PANEL
ALT: 16 U/L (ref 0–35)
AST: 19 U/L (ref 0–37)
Albumin: 3.8 g/dL (ref 3.5–5.2)
Alkaline Phosphatase: 46 U/L (ref 39–117)
Anion gap: 6 (ref 5–15)
BUN: <5 mg/dL — ABNORMAL LOW (ref 6–23)
CO2: 21 mmol/L (ref 19–32)
Calcium: 8.6 mg/dL (ref 8.4–10.5)
Chloride: 105 mmol/L (ref 96–112)
Creatinine, Ser: 0.57 mg/dL (ref 0.50–1.10)
GFR calc Af Amer: >90 mL/min (ref >90)
Glucose, Bld: 106 mg/dL — ABNORMAL HIGH (ref 70–99)
Potassium: 3.1 mmol/L — ABNORMAL LOW (ref 3.5–5.1)
Sodium: 132 mmol/L — ABNORMAL LOW (ref 135–145)
TOTAL PROTEIN: 6.6 g/dL (ref 6.0–8.3)
Total Bilirubin: 0.7 mg/dL (ref 0.3–1.2)

## 2014-09-25 LAB — BRAIN NATRIURETIC PEPTIDE: B Natriuretic Peptide: 20 pg/mL (ref 0.0–100.0)

## 2014-09-25 LAB — TROPONIN I: Troponin I: <0.03 ng/mL (ref <0.031)

## 2014-09-25 MED ORDER — HYDROCODONE-ACETAMINOPHEN 5-325 MG PO TABS: 1.0000 | ORAL_TABLET | Freq: Four times a day (QID) | ORAL | Status: DC | PRN

## 2014-09-25 MED ORDER — LORAZEPAM 2 MG/ML IJ SOLN
0.5000 mg | Freq: Once | INTRAMUSCULAR | Status: AC
  Administered 2014-09-25: 0.5 mg via INTRAVENOUS
  Filled 2014-09-25: qty 1

## 2014-09-25 MED ORDER — HYDROMORPHONE HCL 1 MG/ML IJ SOLN
1.0000 mg | Freq: Once | INTRAMUSCULAR | Status: AC
  Administered 2014-09-25: 1 mg via INTRAVENOUS
  Filled 2014-09-25: qty 1

## 2014-09-25 MED ORDER — ONDANSETRON HCL 4 MG/2ML IJ SOLN
4.0000 mg | Freq: Once | INTRAMUSCULAR | Status: AC
  Administered 2014-09-25: 4 mg via INTRAVENOUS
  Filled 2014-09-25: qty 2

## 2014-09-25 NOTE — ED Notes (Addendum)
Pt. C/o chest pain and headache. Pt. Reports BP was greater than 200 at 6pm yesterday and she began taking nitro to try and relief her chest pain and decrease her BP. Pt. Reports "I took 8-10 nitro between 6pm and now, I lost count". Pt. Also reports taking 1 morphine pill at 6pm yesterday.

## 2014-09-25 NOTE — ED Notes (Signed)
Pt reports pain to left chest, states "like someone stabbing a sword through my chest to my back"  Pt states the pain started approx 6pm last night and she has taken approx 10 nitroglycerin with no relief.

## 2014-09-25 NOTE — ED Notes (Signed)
Pt. Placed on 2L [place], sat 93%.

## 2014-09-25 NOTE — Discharge Instructions (Signed)
Follow up with your md this week or next

## 2014-09-25 NOTE — ED Provider Notes (Signed)
CSN: [id number]     Arrival date & time 09/25/14  0245 History   First MD Initiated Contact with Patient 09/25/14 0302     Chief Complaint  Patient presents with  . Chest Pain     (Consider location/radiation/quality/duration/timing/severity/associated sxs/prior Treatment) Patient is a 64 y.o. female presenting with chest pain. The history is provided by the patient (the pt complains of chest pain for one day).  Chest Pain Pain location:  Substernal area Pain quality: aching   Pain radiates to:  Does not radiate Pain radiates to the back: no   Pain severity:  Moderate Onset quality:  Sudden Timing:  Constant Progression:  Waxing and waning Chronicity:  Recurrent Context: not breathing   Associated symptoms: no abdominal pain, no back pain, no cough, no fatigue and no headache     Past Medical History  Diagnosis Date  . Hypertension   . CAD (coronary artery disease)   . History of breast cancer   . History of stress test 07/2013    normal  . Chronic headache   . GERD (gastroesophageal reflux disease)   . Chronic chest pain   . History of stress test 2003    normal adenosine cardiolite study   Past Surgical History  Procedure Laterality Date  . Cesarean section    . Breast surgery      due to Cancer  . Throat surgery    . Brain surgery     No family history on file. History  Substance Use Topics  . Smoking status: Current Every Day Smoker -- 0.25 packs/day    Types: Cigarettes  . Smokeless tobacco: Not on file  . Alcohol Use: No   OB History    No data available     Review of Systems  Constitutional: Negative for appetite change and fatigue.  HENT: Negative for congestion, ear discharge and sinus pressure.   Eyes: Negative for discharge.  Respiratory: Negative for cough.   Cardiovascular: Positive for chest pain.  Gastrointestinal: Negative for abdominal pain and diarrhea.  Genitourinary: Negative for frequency and hematuria.  Musculoskeletal: Negative  for back pain.  Skin: Negative for rash.  Neurological: Negative for seizures and headaches.  Psychiatric/Behavioral: Negative for hallucinations.      Allergies  Review of patient's allergies indicates no known allergies.  Home Medications   Prior to Admission medications   Medication Sig Start Date End Date Taking? Authorizing Provider  ALPRAZolam [name]) 1 MG tablet Take 1 mg by mouth at bedtime as needed for anxiety or sleep.   Yes Historical Provider, MD  amLODipine (NORVASC) 10 MG tablet Take 10 mg by mouth daily.   Yes Historical Provider, MD  Ascorbic Acid (VITAMIN C PO) Take 1 tablet by mouth daily.   Yes Historical Provider, MD  aspirin EC 81 MG tablet Take 81 mg by mouth daily.   Yes Historical Provider, MD  Cholecalciferol (VITAMIN D PO) Take 1 tablet by mouth daily.   Yes Historical Provider, MD  docusate sodium (COLACE) 100 MG capsule Take 200 mg by mouth daily.   Yes Historical Provider, MD  morphine (MSIR) 30 MG tablet Take 1 tablet (30 mg total) by mouth every 4 (four) hours as needed for severe pain. 06/23/14  Yes Frazier Richards, MD  nitroGLYCERIN (NITROSTAT) 0.4 MG SL tablet Place 0.4 mg under the tongue every 5 (five) minutes as needed for chest pain.   Yes Historical Provider, MD  potassium chloride (K-DUR,KLOR-CON) 10 MEQ tablet Take 10 mEq by  mouth daily.   Yes Historical Provider, MD  pravastatin (PRAVACHOL) 40 MG tablet Take 40 mg by mouth daily.   Yes Historical Provider, MD  HYDROcodone-acetaminophen (NORCO/VICODIN) 5-325 MG per tablet Take 1 tablet by mouth every 6 (six) hours as needed. 09/25/14   Maudry Diego, MD  meloxicam (MOBIC) 15 MG tablet Take 1 tablet (15 mg total) by mouth daily as needed for pain. 06/23/14   Frazier Richards, MD   BP 113/66 mmHg  Pulse 67  Temp(Src) 97.5 F (36.4 C) (Oral)  Resp 16  Ht 5\' 2"  (1.575 m)  Wt 150 lb (68.04 kg)  BMI 27.43 kg/m2  SpO2 99% Physical Exam  Constitutional: She is oriented to person, place, and time. She  appears well-developed.  HENT:  Head: Normocephalic.  Eyes: Conjunctivae and EOM are normal. No scleral icterus.  Neck: Neck supple. No thyromegaly present.  Cardiovascular: Normal rate and regular rhythm.  Exam reveals no gallop and no friction rub.   No murmur heard. Pulmonary/Chest: No stridor. She has no wheezes. She has no rales. She exhibits tenderness.  Tender left ant. chest  Abdominal: She exhibits no distension. There is no tenderness. There is no rebound.  Musculoskeletal: Normal range of motion. She exhibits no edema.  Lymphadenopathy:    She has no cervical adenopathy.  Neurological: She is oriented to person, place, and time. She exhibits normal muscle tone. Coordination normal.  Skin: No rash noted. No erythema.  Psychiatric: She has a normal mood and affect. Her behavior is normal.    ED Course  Procedures (including critical care time) Labs Review Labs Reviewed  CBC WITH DIFFERENTIAL/PLATELET - Abnormal; Notable for the following:    HCT 35.9 (*)    All other components within normal limits  COMPREHENSIVE METABOLIC PANEL - Abnormal; Notable for the following:    Sodium 132 (*)    Potassium 3.1 (*)    Glucose, Bld 106 (*)    BUN <5 (*)    All other components within normal limits  TROPONIN I  TROPONIN I  BRAIN NATRIURETIC PEPTIDE    Imaging Review Dg Chest Portable 1 View  09/25/2014   CLINICAL DATA:  Acute onset of generalized chest pain. Initial encounter.  EXAM: PORTABLE CHEST - 1 VIEW  COMPARISON:  Chest radiograph performed 06/21/2014  FINDINGS: The lungs are well-aerated. Mild vascular congestion is noted. Mild bibasilar atelectasis is seen. There is no evidence of pleural effusion or pneumothorax.  The cardiomediastinal silhouette is borderline enlarged. No acute osseous abnormalities are seen.  IMPRESSION: Mild vascular congestion and borderline cardiomegaly. Mild bibasilar atelectasis noted.   Electronically Signed   By: Garald Balding M.D.   On:  09/25/2014 03:10     EKG Interpretation   Date/Time:  Wednesday September 25 2014 02:52:34 EST Ventricular Rate:  69 PR Interval:  157 QRS Duration: 76 QT Interval:  412 QTC Calculation: 441 R Axis:   24 Text Interpretation:  Sinus rhythm Confirmed by Layali Freund  MD, Alaja Goldinger ([id number])  on 09/25/2014 3:37:34 AM      MDM   Final diagnoses:  Chest pain at rest    Chest wall pain,  Labs nl,  tx with vicodin and follow up     Maudry Diego, MD 09/25/14 [phone]

## 2014-12-15 ENCOUNTER — Encounter (HOSPITAL_COMMUNITY): Payer: Self-pay | Admitting: Emergency Medicine

## 2014-12-15 ENCOUNTER — Emergency Department (HOSPITAL_COMMUNITY)
Admission: EM | Admit: 2014-12-15 | Discharge: 2014-12-15 | Disposition: A | Discharge status: Home / Self Care | Payer: Non-veteran care | Attending: Emergency Medicine | Admitting: Emergency Medicine

## 2014-12-15 ENCOUNTER — Emergency Department (HOSPITAL_COMMUNITY): Payer: Non-veteran care

## 2014-12-15 ENCOUNTER — Emergency Department (HOSPITAL_COMMUNITY): Payer: Self-pay

## 2014-12-15 DIAGNOSIS — G8929 Other chronic pain: Secondary | ICD-10-CM | POA: Insufficient documentation

## 2014-12-15 DIAGNOSIS — R1032 Left lower quadrant pain: Secondary | ICD-10-CM

## 2014-12-15 DIAGNOSIS — Z79899 Other long term (current) drug therapy: Secondary | ICD-10-CM | POA: Insufficient documentation

## 2014-12-15 DIAGNOSIS — Z7982 Long term (current) use of aspirin: Secondary | ICD-10-CM | POA: Insufficient documentation

## 2014-12-15 DIAGNOSIS — Z720 Tobacco use: Secondary | ICD-10-CM | POA: Insufficient documentation

## 2014-12-15 DIAGNOSIS — K5900 Constipation, unspecified: Secondary | ICD-10-CM

## 2014-12-15 DIAGNOSIS — I10 Essential (primary) hypertension: Secondary | ICD-10-CM | POA: Insufficient documentation

## 2014-12-15 DIAGNOSIS — I2510 Atherosclerotic heart disease of native coronary artery without angina pectoris: Secondary | ICD-10-CM | POA: Insufficient documentation

## 2014-12-15 DIAGNOSIS — Z853 Personal history of malignant neoplasm of breast: Secondary | ICD-10-CM | POA: Insufficient documentation

## 2014-12-15 LAB — URINE MICROSCOPIC-ADD ON

## 2014-12-15 LAB — URINALYSIS, ROUTINE W REFLEX MICROSCOPIC
Bilirubin Urine: NEGATIVE
GLUCOSE, UA: NEGATIVE mg/dL
Ketones, ur: NEGATIVE mg/dL
LEUKOCYTES UA: NEGATIVE
Nitrite: NEGATIVE
PH: 7 (ref 5.0–8.0)
PROTEIN: NEGATIVE mg/dL
UROBILINOGEN UA: 0.2 mg/dL (ref 0.0–1.0)

## 2014-12-15 LAB — CBC WITH DIFFERENTIAL/PLATELET
BASOS PCT: 0 % (ref 0–1)
Basophils Absolute: 0 10*3/uL (ref 0.0–0.1)
EOS ABS: 0.1 10*3/uL (ref 0.0–0.7)
EOS PCT: 1 % (ref 0–5)
HCT: 41.7 % (ref 36.0–46.0)
HEMOGLOBIN: 13.9 g/dL (ref 12.0–15.0)
LYMPHS ABS: 1.8 10*3/uL (ref 0.7–4.0)
Lymphocytes Relative: 25 % (ref 12–46)
MCH: 30.5 pg (ref 26.0–34.0)
MCHC: 33.3 g/dL (ref 30.0–36.0)
MCV: 91.6 fL (ref 78.0–100.0)
MONO ABS: 0.5 10*3/uL (ref 0.1–1.0)
MONOS PCT: 7 % (ref 3–12)
NEUTROS PCT: 67 % (ref 43–77)
Neutro Abs: 4.7 10*3/uL (ref 1.7–7.7)
PLATELETS: 213 10*3/uL (ref 150–400)
RBC: 4.55 MIL/uL (ref 3.87–5.11)
RDW: 13.5 % (ref 11.5–15.5)
WBC: 7 10*3/uL (ref 4.0–10.5)

## 2014-12-15 LAB — COMPREHENSIVE METABOLIC PANEL
ALK PHOS: 51 U/L (ref 39–117)
ALT: 13 U/L (ref 0–35)
AST: 18 U/L (ref 0–37)
Albumin: 4 g/dL (ref 3.5–5.2)
Anion gap: 8 (ref 5–15)
BUN: 5 mg/dL — AB (ref 6–23)
CHLORIDE: 106 mmol/L (ref 96–112)
CO2: 24 mmol/L (ref 19–32)
CREATININE: 0.66 mg/dL (ref 0.50–1.10)
Calcium: 8.8 mg/dL (ref 8.4–10.5)
GFR calc Af Amer: >90 mL/min (ref >90)
GFR calc non Af Amer: >90 mL/min (ref >90)
Glucose, Bld: 110 mg/dL — ABNORMAL HIGH (ref 70–99)
Potassium: 3.4 mmol/L — ABNORMAL LOW (ref 3.5–5.1)
SODIUM: 138 mmol/L (ref 135–145)
TOTAL PROTEIN: 7.1 g/dL (ref 6.0–8.3)
Total Bilirubin: 0.7 mg/dL (ref 0.3–1.2)

## 2014-12-15 LAB — LIPASE, BLOOD: LIPASE: 19 U/L (ref 11–59)

## 2014-12-15 MED ORDER — ONDANSETRON HCL 4 MG/2ML IJ SOLN
4.0000 mg | Freq: Once | INTRAMUSCULAR | Status: AC
  Administered 2014-12-15: 4 mg via INTRAVENOUS
  Filled 2014-12-15: qty 2

## 2014-12-15 MED ORDER — IOHEXOL 300 MG/ML  SOLN
25.0000 mL | Freq: Once | INTRAMUSCULAR | Status: AC | PRN
  Administered 2014-12-15: 25 mL via ORAL

## 2014-12-15 MED ORDER — SODIUM CHLORIDE 0.9 % IV BOLUS (SEPSIS)
500.0000 mL | Freq: Once | INTRAVENOUS | Status: AC
  Administered 2014-12-15: 500 mL via INTRAVENOUS

## 2014-12-15 MED ORDER — IOHEXOL 300 MG/ML  SOLN
100.0000 mL | Freq: Once | INTRAMUSCULAR | Status: AC | PRN
  Administered 2014-12-15: 100 mL via INTRAVENOUS

## 2014-12-15 MED ORDER — MORPHINE SULFATE 4 MG/ML IJ SOLN
4.0000 mg | Freq: Once | INTRAMUSCULAR | Status: AC
  Administered 2014-12-15: 4 mg via INTRAVENOUS
  Filled 2014-12-15: qty 1

## 2014-12-15 NOTE — ED Notes (Signed)
Pt given ginger ale.

## 2014-12-15 NOTE — Discharge Instructions (Signed)
CT scan revealed an excessive amount of stool in your colon.  Increase fluids, fruits, fibers.  Recommended over-the-counter products including Dulcolax, Miralax, magnesium citrate.

## 2014-12-15 NOTE — ED Provider Notes (Signed)
CSN: [id number]     Arrival date & time 12/15/14  1332 History   First MD Initiated Contact with Patient 12/15/14 1351     Chief Complaint  Patient presents with  . Constipation     (Consider location/radiation/quality/duration/timing/severity/associated sxs/prior Treatment) HPI..... Complains of constipation for several days. She has taken laxatives with minimal results. She is able to eat. No vomiting. Complains of general upper abdominal discomfort. No dysuria, fever, chills. Severity is mild.  Past Medical History  Diagnosis Date  . Hypertension   . CAD (coronary artery disease)   . History of breast cancer   . History of stress test 07/2013    normal  . Chronic headache   . GERD (gastroesophageal reflux disease)   . Chronic chest pain   . History of stress test 2003    normal adenosine cardiolite study   Past Surgical History  Procedure Laterality Date  . Cesarean section    . Breast surgery      due to Cancer  . Throat surgery    . Brain surgery    . Eye surgery     History reviewed. No pertinent family history. History  Substance Use Topics  . Smoking status: Current Every Day Smoker -- 0.25 packs/day    Types: Cigarettes  . Smokeless tobacco: Not on file  . Alcohol Use: No   OB History    No data available     Review of Systems  All other systems reviewed and are negative.     Allergies  Review of patient's allergies indicates no known allergies.  Home Medications   Prior to Admission medications   Medication Sig Start Date End Date Taking? Authorizing Provider  ALPRAZolam [name]) 1 MG tablet Take 1 mg by mouth at bedtime as needed for anxiety or sleep.   Yes Historical Provider, MD  amLODipine (NORVASC) 10 MG tablet Take 10 mg by mouth daily.   Yes Historical Provider, MD  Ascorbic Acid (VITAMIN C PO) Take 1 tablet by mouth daily.   Yes Historical Provider, MD  aspirin EC 81 MG tablet Take 81 mg by mouth daily.   Yes Historical Provider, MD   Cholecalciferol (VITAMIN D PO) Take 1 tablet by mouth daily.   Yes Historical Provider, MD  docusate sodium (COLACE) 100 MG capsule Take 200 mg by mouth daily.   Yes Historical Provider, MD  HYDROcodone-acetaminophen (NORCO/VICODIN) 5-325 MG per tablet Take 1 tablet by mouth every 6 (six) hours as needed. 09/25/14  Yes Milton Ferguson, MD  meloxicam (MOBIC) 15 MG tablet Take 1 tablet (15 mg total) by mouth daily as needed for pain. 06/23/14  Yes Frazier Richards, MD  morphine (MSIR) 30 MG tablet Take 1 tablet (30 mg total) by mouth every 4 (four) hours as needed for severe pain. 06/23/14  Yes Frazier Richards, MD  nitroGLYCERIN (NITROSTAT) 0.4 MG SL tablet Place 0.4 mg under the tongue every 5 (five) minutes as needed for chest pain.   Yes Historical Provider, MD  polyvinyl alcohol (LIQUIFILM TEARS) 1.4 % ophthalmic solution Place 1 drop into both eyes as needed for dry eyes.   Yes Historical Provider, MD  potassium chloride (K-DUR,KLOR-CON) 10 MEQ tablet Take 10 mEq by mouth daily.   Yes Historical Provider, MD  pravastatin (PRAVACHOL) 40 MG tablet Take 40 mg by mouth daily.   Yes Historical Provider, MD   BP 142/76 mmHg  Pulse 74  Temp(Src) 98.3 F (36.8 C) (Oral)  Resp 18  SpO2 98%  Physical Exam  Constitutional: She is oriented to person, place, and time. She appears well-developed and well-nourished.  HENT:  Head: Normocephalic and atraumatic.  Eyes: Conjunctivae and EOM are normal. Pupils are equal, round, and reactive to light.  Neck: Normal range of motion. Neck supple.  Cardiovascular: Normal rate and regular rhythm.   Pulmonary/Chest: Effort normal and breath sounds normal.  Abdominal: Soft. Bowel sounds are normal.  Minimal tenderness in upper abdomen. No acute abdomen.  Musculoskeletal: Normal range of motion.  Neurological: She is alert and oriented to person, place, and time.  Skin: Skin is warm and dry.  Psychiatric: She has a normal mood and affect. Her behavior is normal.  Nursing  note and vitals reviewed.   ED Course  Procedures (including critical care time) Labs Review Labs Reviewed  COMPREHENSIVE METABOLIC PANEL - Abnormal; Notable for the following:    Potassium 3.4 (*)    Glucose, Bld 110 (*)    BUN 5 (*)    All other components within normal limits  URINALYSIS, ROUTINE W REFLEX MICROSCOPIC - Abnormal; Notable for the following:    Specific Gravity, Urine <1.005 (*)    Hgb urine dipstick SMALL (*)    All other components within normal limits  URINE MICROSCOPIC-ADD ON - Abnormal; Notable for the following:    Squamous Epithelial / LPF FEW (*)    Bacteria, UA FEW (*)    All other components within normal limits  CBC WITH DIFFERENTIAL/PLATELET  LIPASE, BLOOD    Imaging Review Ct Abdomen Pelvis W Contrast  12/15/2014   CLINICAL DATA:  Left upper abdominal pain and constipation.  EXAM: CT ABDOMEN AND PELVIS WITH CONTRAST  TECHNIQUE: Multidetector CT imaging of the abdomen and pelvis was performed using the standard protocol following bolus administration of intravenous contrast.  CONTRAST:  80mL OMNIPAQUE IOHEXOL 300 MG/ML SOLN, 165mL OMNIPAQUE IOHEXOL 300 MG/ML SOLN  COMPARISON:  None.  FINDINGS: Large upper pole left renal cyst measuring 8.3 cm in maximum diameter. This has CT features of a simple cyst. Two probable tiny cysts in the upper liver on the right. Normal appearing spleen, pancreas, gallbladder, adrenal glands, right kidney and urinary bladder. No gastrointestinal abnormalities or enlarged lymph nodes. Normal appearing appendix. Atheromatous arterial calcifications. Mildly prominent stool in the right and transverse colon. Minimal dependent atelectasis at both lung bases. Mild lumbar spine degenerative changes.  IMPRESSION: 1. No acute abnormality. 2. Prominent stool in the right in transverse colon. 3. Large, simple upper pole left renal cyst.   Electronically Signed   By: Claudie Revering M.D.   On: 12/15/2014 16:19     EKG Interpretation None       MDM   Final diagnoses:  Constipation  LLQ pain    No acute abdomen. White count normal. Chemistry panel normal. Lipase normal CT abdomen pelvis shows no acute abnormality. There is prominent stool in the right transverse colon. These findings were discussed with the patient.    Nat Christen, MD 12/19/14 [phone]

## 2014-12-15 NOTE — ED Notes (Signed)
Pt states she took laxative one week ago and when went to have bm and had bright red blood in toilet. Pt states has had small bm's until today. States took another laxative x3 yesterday (Rexol). Has not had any bm since last laxative. Pt c/o pain to under Left breast and left lower back. Pt states is not urinating as much. Last meal yesterday 5pm.

## 2015-05-19 ENCOUNTER — Telehealth: Payer: Self-pay | Admitting: Gastroenterology

## 2015-05-19 ENCOUNTER — Encounter: Payer: Self-pay | Admitting: Internal Medicine

## 2015-05-19 NOTE — Telephone Encounter (Signed)
Patient isn't transferring care. [facility] her to have her [facility] as outpatient. Records need to be reviewed for repeat colonoscopy.

## 2015-05-19 NOTE — Telephone Encounter (Signed)
Patient isn't transferring care. [facility] is paying for her to have her colon ad outpatient. Records need to reviewed for repeat colonoscopy.

## 2015-06-05 ENCOUNTER — Encounter: Payer: Self-pay | Admitting: Gastroenterology

## 2015-08-05 ENCOUNTER — Ambulatory Visit: Payer: Non-veteran care | Admitting: Gastroenterology

## 2016-02-20 ENCOUNTER — Emergency Department (HOSPITAL_COMMUNITY): Payer: Self-pay

## 2016-02-20 ENCOUNTER — Encounter (HOSPITAL_COMMUNITY): Payer: Self-pay

## 2016-02-20 ENCOUNTER — Inpatient Hospital Stay (HOSPITAL_COMMUNITY)
Admission: EM | Admit: 2016-02-20 | Discharge: 2016-02-22 | DRG: 190 | Disposition: A | Discharge status: Home / Self Care | Payer: Self-pay | Attending: Internal Medicine | Admitting: Internal Medicine

## 2016-02-20 DIAGNOSIS — K219 Gastro-esophageal reflux disease without esophagitis: Secondary | ICD-10-CM | POA: Diagnosis present

## 2016-02-20 DIAGNOSIS — J189 Pneumonia, unspecified organism: Secondary | ICD-10-CM | POA: Diagnosis present

## 2016-02-20 DIAGNOSIS — I2510 Atherosclerotic heart disease of native coronary artery without angina pectoris: Secondary | ICD-10-CM | POA: Diagnosis present

## 2016-02-20 DIAGNOSIS — E785 Hyperlipidemia, unspecified: Secondary | ICD-10-CM | POA: Diagnosis present

## 2016-02-20 DIAGNOSIS — G894 Chronic pain syndrome: Secondary | ICD-10-CM | POA: Diagnosis present

## 2016-02-20 DIAGNOSIS — Z79891 Long term (current) use of opiate analgesic: Secondary | ICD-10-CM

## 2016-02-20 DIAGNOSIS — Z853 Personal history of malignant neoplasm of breast: Secondary | ICD-10-CM

## 2016-02-20 DIAGNOSIS — Z8249 Family history of ischemic heart disease and other diseases of the circulatory system: Secondary | ICD-10-CM

## 2016-02-20 DIAGNOSIS — N644 Mastodynia: Secondary | ICD-10-CM | POA: Diagnosis present

## 2016-02-20 DIAGNOSIS — R918 Other nonspecific abnormal finding of lung field: Secondary | ICD-10-CM

## 2016-02-20 DIAGNOSIS — E782 Mixed hyperlipidemia: Secondary | ICD-10-CM | POA: Diagnosis present

## 2016-02-20 DIAGNOSIS — J440 Chronic obstructive pulmonary disease with acute lower respiratory infection: Principal | ICD-10-CM | POA: Diagnosis present

## 2016-02-20 DIAGNOSIS — R079 Chest pain, unspecified: Secondary | ICD-10-CM | POA: Diagnosis present

## 2016-02-20 DIAGNOSIS — Z7982 Long term (current) use of aspirin: Secondary | ICD-10-CM

## 2016-02-20 DIAGNOSIS — Z800 Family history of malignant neoplasm of digestive organs: Secondary | ICD-10-CM

## 2016-02-20 DIAGNOSIS — I10 Essential (primary) hypertension: Secondary | ICD-10-CM | POA: Diagnosis present

## 2016-02-20 DIAGNOSIS — F17210 Nicotine dependence, cigarettes, uncomplicated: Secondary | ICD-10-CM | POA: Diagnosis present

## 2016-02-20 LAB — BASIC METABOLIC PANEL
ANION GAP: 10 (ref 5–15)
BUN: 5 mg/dL — ABNORMAL LOW (ref 6–20)
CHLORIDE: 105 mmol/L (ref 101–111)
CO2: 23 mmol/L (ref 22–32)
CREATININE: 0.77 mg/dL (ref 0.44–1.00)
Calcium: 8.9 mg/dL (ref 8.9–10.3)
GFR calc Af Amer: >60 mL/min (ref >60)
GFR calc non Af Amer: >60 mL/min (ref >60)
Glucose, Bld: 112 mg/dL — ABNORMAL HIGH (ref 65–99)
POTASSIUM: 3.9 mmol/L (ref 3.5–5.1)
SODIUM: 138 mmol/L (ref 135–145)

## 2016-02-20 LAB — CBC
HEMATOCRIT: 41.8 % (ref 36.0–46.0)
HEMOGLOBIN: 14.1 g/dL (ref 12.0–15.0)
MCH: 30.8 pg (ref 26.0–34.0)
MCHC: 33.7 g/dL (ref 30.0–36.0)
MCV: 91.3 fL (ref 78.0–100.0)
Platelets: 258 10*3/uL (ref 150–400)
RBC: 4.58 MIL/uL (ref 3.87–5.11)
RDW: 13.6 % (ref 11.5–15.5)
WBC: 8 10*3/uL (ref 4.0–10.5)

## 2016-02-20 LAB — TROPONIN I: Troponin I: <0.03 ng/mL (ref <0.03)

## 2016-02-20 MED ORDER — DEXTROSE 5 % IV SOLN
1.0000 g | Freq: Once | INTRAVENOUS | Status: AC
  Administered 2016-02-21: 1 g via INTRAVENOUS
  Filled 2016-02-20: qty 10

## 2016-02-20 MED ORDER — MORPHINE SULFATE (PF) 2 MG/ML IV SOLN
2.0000 mg | Freq: Once | INTRAVENOUS | Status: AC
  Administered 2016-02-20: 2 mg via INTRAVENOUS
  Filled 2016-02-20: qty 1

## 2016-02-20 MED ORDER — ONDANSETRON HCL 4 MG/2ML IJ SOLN
4.0000 mg | Freq: Once | INTRAMUSCULAR | Status: AC
  Administered 2016-02-20: 4 mg via INTRAVENOUS
  Filled 2016-02-20: qty 2

## 2016-02-20 MED ORDER — SODIUM CHLORIDE 0.9 % IV SOLN
INTRAVENOUS | Status: DC
  Administered 2016-02-20 – 2016-02-22 (×3): via INTRAVENOUS

## 2016-02-20 MED ORDER — SODIUM CHLORIDE 0.9 % IV BOLUS (SEPSIS)
250.0000 mL | Freq: Once | INTRAVENOUS | Status: AC
  Administered 2016-02-20: 250 mL via INTRAVENOUS

## 2016-02-20 MED ORDER — IOPAMIDOL (ISOVUE-370) INJECTION 76%
100.0000 mL | Freq: Once | INTRAVENOUS | Status: AC | PRN
  Administered 2016-02-20: 100 mL via INTRAVENOUS

## 2016-02-20 MED ORDER — HYDROMORPHONE HCL 1 MG/ML IJ SOLN
1.0000 mg | Freq: Once | INTRAMUSCULAR | Status: AC
  Administered 2016-02-21: 1 mg via INTRAVENOUS
  Filled 2016-02-20: qty 1

## 2016-02-20 MED ORDER — DEXTROSE 5 % IV SOLN
500.0000 mg | Freq: Once | INTRAVENOUS | Status: AC
  Administered 2016-02-21: 500 mg via INTRAVENOUS
  Filled 2016-02-20: qty 500

## 2016-02-20 NOTE — ED Notes (Signed)
Left chest pressure started today at 1500. C/o shortness of breath, back pain, dizziness, and "numbness all over"

## 2016-02-20 NOTE — ED Provider Notes (Signed)
CSN: [id number]     Arrival date & time 02/20/16  2020 History  By signing my name below, I, Dora Sims, attest that this documentation has been prepared under the direction and in the presence of physician practitioner, Fredia Sorrow, MD. Electronically Signed: Dora Sims, Scribe. 02/20/2016. 8:59 PM.    Chief Complaint  Patient presents with  . Chest Pain    Patient is a 65 y.o. female presenting with chest pain. The history is provided by the patient. No language interpreter was used.  Chest Pain Pain location:  L chest Pain quality: pressure and radiating   Pain radiates to:  L shoulder Pain radiates to the back: no   Pain severity:  Severe Onset quality:  Sudden Duration:  6 hours Timing:  Constant Progression:  Worsening Chronicity:  Recurrent Context: at rest   Relieved by:  Nothing Ineffective treatments:  Aspirin and nitroglycerin Associated symptoms: abdominal pain (LUQ), cough, fever, headache, nausea, numbness and shortness of breath   Associated symptoms: no back pain and not vomiting   Abdominal pain:    Location:  LUQ   Severity:  Moderate   Onset quality:  Sudden   Duration:  6 hours   Timing:  Constant   Progression:  Unchanged   Chronicity:  New Cough:    Cough characteristics:  Dry   Severity:  Mild   Onset quality:  Sudden   Duration:  6 hours   Timing:  Intermittent   Progression:  Unchanged   Chronicity:  New Fever:    Duration:  2 days   Timing:  Constant   Temp source:  Subjective   Progression:  Unchanged Headaches:    Severity:  Moderate   Onset quality:  Sudden   Duration:  6 hours   Timing:  Constant   Progression:  Unchanged   Chronicity:  New Nausea:    Severity:  Moderate   Onset quality:  Sudden   Duration:  6 hours   Timing:  Constant   Progression:  Unchanged Shortness of breath:    Severity:  Unable to specify   Onset quality:  Sudden   Duration:  6 hours   Timing:  Constant   Progression:  Unchanged Risk  factors: coronary artery disease, hypertension and smoking   Risk factors: not female      HPI Comments: Dawn Mckay is a 65 y.o. female with PMHx of CAD, HTN, GERD, chronic chest pain, and chronic headache who presents to the Emergency Department complaining of sudden onset, constant, worsening, 9/10, left-sided chest pain beginning about 6 hours ago. Pt describes her chest pain as a pressure-like sensation. She rates her initial CP as 6/10. She notes that this pain radiates into her left shoulder. Pt notes associated nausea, SOB, fever, chills, headache, LUQ pain, cough, blurry vision, dysuria, and generalized body tingling as well. She reports that her fever, chills, and dysuria are related to her ongoing subjective kidney infection over the last couple of days; she is not on antibiotics. Pt used nitroglycerin x6 this afternoon as well as regular aspirin x4 with no relief of her chest pain. Pt has been using nitroglycerin since 2000 due to a heart attack; she does not have coronary stents. She notes that she has experienced left-sided chest pain and pressure in the past, but not as severe. She denies hematuria, back pain, vomiting, or any other associated symptoms. Her cardiologist is at the [place].   Past Medical History  Diagnosis Date  . Hypertension   .  CAD (coronary artery disease)   . History of breast cancer   . History of stress test 07/2013    normal  . Chronic headache   . GERD (gastroesophageal reflux disease)   . Chronic chest pain   . History of stress test 2003    normal adenosine cardiolite study   Past Surgical History  Procedure Laterality Date  . Cesarean section    . Breast surgery      due to Cancer  . Throat surgery    . Brain surgery    . Eye surgery     History reviewed. No pertinent family history. Social History  Substance Use Topics  . Smoking status: Current Every Day Smoker -- 0.25 packs/day    Types: Cigarettes  . Smokeless tobacco: None  . Alcohol  Use: No   OB History    No data available     Review of Systems  Constitutional: Positive for fever and chills.  HENT: Negative for rhinorrhea and sore throat.   Eyes: Positive for visual disturbance.  Respiratory: Positive for cough and shortness of breath.   Cardiovascular: Positive for chest pain.  Gastrointestinal: Positive for nausea and abdominal pain (LUQ). Negative for vomiting and diarrhea.  Genitourinary: Positive for dysuria. Negative for hematuria.  Musculoskeletal: Positive for arthralgias (left shoulder). Negative for back pain and joint swelling.  Skin: Negative for rash.  Neurological: Positive for numbness and headaches.  Hematological: Does not bruise/bleed easily.  Psychiatric/Behavioral: Negative for confusion.   Allergies  Review of patient's allergies indicates no known allergies.  Home Medications   Prior to Admission medications   Medication Sig Start Date End Date Taking? Authorizing Provider  ALPRAZolam [name]) 1 MG tablet Take 1 mg by mouth at bedtime as needed for anxiety or sleep.    Historical Provider, MD  amLODipine (NORVASC) 10 MG tablet Take 10 mg by mouth daily.    Historical Provider, MD  Ascorbic Acid (VITAMIN C PO) Take 1 tablet by mouth daily.    Historical Provider, MD  aspirin EC 81 MG tablet Take 81 mg by mouth daily.    Historical Provider, MD  Cholecalciferol (VITAMIN D PO) Take 1 tablet by mouth daily.    Historical Provider, MD  docusate sodium (COLACE) 100 MG capsule Take 200 mg by mouth daily.    Historical Provider, MD  HYDROcodone-acetaminophen (NORCO/VICODIN) 5-325 MG per tablet Take 1 tablet by mouth every 6 (six) hours as needed. 09/25/14   Milton Ferguson, MD  meloxicam (MOBIC) 15 MG tablet Take 1 tablet (15 mg total) by mouth daily as needed for pain. 06/23/14   Frazier Richards, MD  morphine (MSIR) 30 MG tablet Take 1 tablet (30 mg total) by mouth every 4 (four) hours as needed for severe pain. 06/23/14   Frazier Richards, MD   nitroGLYCERIN (NITROSTAT) 0.4 MG SL tablet Place 0.4 mg under the tongue every 5 (five) minutes as needed for chest pain.    Historical Provider, MD  polyvinyl alcohol (LIQUIFILM TEARS) 1.4 % ophthalmic solution Place 1 drop into both eyes as needed for dry eyes.    Historical Provider, MD  potassium chloride (K-DUR,KLOR-CON) 10 MEQ tablet Take 10 mEq by mouth daily.    Historical Provider, MD  pravastatin (PRAVACHOL) 40 MG tablet Take 40 mg by mouth daily.    Historical Provider, MD   BP 131/61 mmHg  Pulse 68  Temp(Src) 98.3 F (36.8 C) (Oral)  Resp 21  Ht 5\' 3"  (1.6 m)  Wt  68.04 kg  BMI 26.58 kg/m2  SpO2 97% Physical Exam  Constitutional: She is oriented to person, place, and time. She appears well-developed and well-nourished. No distress.  HENT:  Head: Normocephalic and atraumatic.  Mouth/Throat: Oropharynx is clear and moist.  Eyes: Conjunctivae and EOM are normal. Pupils are equal, round, and reactive to light. No scleral icterus.  Sclera are clear bilaterally. Eyes track normally.  Neck: Neck supple. No tracheal deviation present.  Cardiovascular: Normal rate, regular rhythm and normal heart sounds.   Pulmonary/Chest: Effort normal and breath sounds normal. No respiratory distress.  Lungs are clear bilaterally.  Abdominal: Bowel sounds are normal. There is no tenderness.  Musculoskeletal: Normal range of motion. She exhibits no edema.  No swelling in the ankles.  Neurological: She is alert and oriented to person, place, and time. No cranial nerve deficit. She exhibits normal muscle tone. Coordination normal.  Skin: Skin is warm and dry.  Psychiatric: She has a normal mood and affect. Her behavior is normal.  Nursing note and vitals reviewed.   ED Course  Procedures (including critical care time)  DIAGNOSTIC STUDIES: Oxygen Saturation is 95% on RA, adequate by my interpretation.    COORDINATION OF CARE: 8:59 PM Discussed treatment plan with pt at bedside and pt  agreed to plan.  Results for orders placed or performed during the hospital encounter of [id number]  Basic metabolic panel  Result Value Ref Range   Sodium 138 135 - 145 mmol/L   Potassium 3.9 3.5 - 5.1 mmol/L   Chloride 105 101 - 111 mmol/L   CO2 23 22 - 32 mmol/L   Glucose, Bld 112 (H) 65 - 99 mg/dL   BUN 5 (L) 6 - 20 mg/dL   Creatinine, Ser 0.77 0.44 - 1.00 mg/dL   Calcium 8.9 8.9 - 10.3 mg/dL   GFR calc non Af Amer >60 >60 mL/min   GFR calc Af Amer >60 >60 mL/min   Anion gap 10 5 - 15  CBC  Result Value Ref Range   WBC 8.0 4.0 - 10.5 K/uL   RBC 4.58 3.87 - 5.11 MIL/uL   Hemoglobin 14.1 12.0 - 15.0 g/dL   HCT 41.8 36.0 - 46.0 %   MCV 91.3 78.0 - 100.0 fL   MCH 30.8 26.0 - 34.0 pg   MCHC 33.7 30.0 - 36.0 g/dL   RDW 13.6 11.5 - 15.5 %   Platelets 258 150 - 400 K/uL  Troponin I  Result Value Ref Range   Troponin I <0.03 <0.03 ng/mL   Dg Chest 2 View  02/20/2016  CLINICAL DATA:  Left chest pain and pressure, onset at 15:00. EXAM: CHEST  2 VIEW COMPARISON:  09/25/2014 FINDINGS: There is consolidation in the left lower lobe. This may represent pneumonia. The right lung is clear. The pulmonary vasculature is normal. There is no pleural effusion. Hilar and mediastinal contours are unremarkable and unchanged. IMPRESSION: Left lower lobe consolidation, possibly pneumonia. Followup PA and lateral chest X-ray is recommended in 3-4 weeks following trial of antibiotic therapy to ensure resolution and exclude underlying malignancy. Electronically Signed   By: Andreas Newport M.D.   On: 02/20/2016 21:25   Ct Angio Chest Pe W Or Wo Contrast  02/20/2016  CLINICAL DATA:  Sudden onset left chest pain, 6 hours duration. EXAM: CT ANGIOGRAPHY CHEST WITH CONTRAST TECHNIQUE: Multidetector CT imaging of the chest was performed using the standard protocol during bolus administration of intravenous contrast. Multiplanar CT image reconstructions and MIPs were obtained to evaluate  the vascular anatomy.  CONTRAST:  100 mL Isovue 370 intravenous COMPARISON:  08/04/2013 FINDINGS: Cardiovascular: There is good opacification of the pulmonary arteries. There is no pulmonary embolism. The thoracic aorta is normal in caliber and intact. Lungs: Confluent airspace consolidation with air bronchograms in the left lower lobe base, predominantly the anterior basal segment. Is could represent pneumonia. Linear scarring in the right middle lobe and lingula is unchanged. Central airways: Patent Effusions: None Lymphadenopathy: None Esophagus: Unremarkable Upper abdomen: Hiatal hernia Musculoskeletal: No significant abnormality Review of the MIP images confirms the above findings. IMPRESSION: 1. Negative for acute pulmonary embolism. 2. Left lower lobe airspace consolidation. This could represent pneumonia. This is visible on radiography, and follow-up radiography is necessary to confirm complete clearing. 3. Hiatal hernia Electronically Signed   By: Andreas Newport M.D.   On: 02/20/2016 23:36    Medications  0.9 %  sodium chloride infusion ( Intravenous New Bag/Given 02/20/16 2204)  cefTRIAXone (ROCEPHIN) 1 g in dextrose 5 % 50 mL IVPB (not administered)  azithromycin (ZITHROMAX) 500 mg in dextrose 5 % 250 mL IVPB (not administered)  HYDROmorphone (DILAUDID) injection 1 mg (not administered)  sodium chloride 0.9 % bolus 250 mL (0 mLs Intravenous Stopped 02/20/16 2201)  ondansetron (ZOFRAN) injection 4 mg (4 mg Intravenous Given 02/20/16 2120)  morphine 2 MG/ML injection 2 mg (2 mg Intravenous Given 02/20/16 2120)  iopamidol (ISOVUE-370) 76 % injection 100 mL (100 mLs Intravenous Contrast Given 02/20/16 2259)   ED ECG REPORT   Date: 02/20/2016  Rate: 96  Rhythm: normal sinus rhythm  QRS Axis: normal  Intervals: normal  ST/T Wave abnormalities: normal  Conduction Disutrbances:none  Narrative Interpretation:   Old EKG Reviewed: none available  I have personally reviewed the EKG tracing and agree with the  computerized printout as noted.   MDM   Final diagnoses:  CAP (community acquired pneumonia)  Lung mass   Workup negative for pulmonary embolus. Also patient with complaint of left-sided chest pain that started at 3:00 in the afternoon. First troponin negative. Patient has a history of coronary artery disease. But hasn't any pain lately. Took multiple doses of nitroglycerin at home without any relief of the pain. Patient also took aspirin at home. Do not think that this is unstable angina or acute coronary event. Chest x-ray raise concerns for opacity left side of the lung which was large. CT angios done to rule out pulmonary embolus. That also looks like either pneumonia or perhaps a lung mass. Patient not febrile. she is short of breath. Does have a cough patient does have a history of heavy tobacco use.  Patient to be started on of community-acquired pneumonia protocol IV antibiotics. Discussed with hospitalist for admission. Second point-of-care troponin is still pending.  I personally performed the services described in this documentation, which was scribed in my presence. The recorded information has been reviewed and is accurate.     Fredia Sorrow, MD 02/20/16 [phone]

## 2016-02-21 DIAGNOSIS — R071 Chest pain on breathing: Secondary | ICD-10-CM

## 2016-02-21 DIAGNOSIS — J189 Pneumonia, unspecified organism: Secondary | ICD-10-CM

## 2016-02-21 DIAGNOSIS — R079 Chest pain, unspecified: Secondary | ICD-10-CM

## 2016-02-21 DIAGNOSIS — I10 Essential (primary) hypertension: Secondary | ICD-10-CM

## 2016-02-21 LAB — EXPECTORATED SPUTUM ASSESSMENT W GRAM STAIN, RFLX TO RESP C

## 2016-02-21 LAB — TROPONIN I
Troponin I: <0.03 ng/mL (ref <0.03)
Troponin I: <0.03 ng/mL (ref <0.03)

## 2016-02-21 LAB — COMPREHENSIVE METABOLIC PANEL
ALT: 8 U/L — ABNORMAL LOW (ref 14–54)
AST: 16 U/L (ref 15–41)
Albumin: 3.3 g/dL — ABNORMAL LOW (ref 3.5–5.0)
Alkaline Phosphatase: 48 U/L (ref 38–126)
Anion gap: 6 (ref 5–15)
BILIRUBIN TOTAL: 0.5 mg/dL (ref 0.3–1.2)
CO2: 26 mmol/L (ref 22–32)
Calcium: 8.1 mg/dL — ABNORMAL LOW (ref 8.9–10.3)
Chloride: 105 mmol/L (ref 101–111)
Creatinine, Ser: 0.72 mg/dL (ref 0.44–1.00)
GFR calc Af Amer: >60 mL/min (ref >60)
Glucose, Bld: 98 mg/dL (ref 65–99)
POTASSIUM: 3.6 mmol/L (ref 3.5–5.1)
Sodium: 137 mmol/L (ref 135–145)
TOTAL PROTEIN: 5.9 g/dL — AB (ref 6.5–8.1)

## 2016-02-21 LAB — HEPATIC FUNCTION PANEL
ALBUMIN: 4.1 g/dL (ref 3.5–5.0)
ALK PHOS: 69 U/L (ref 38–126)
ALT: 10 U/L — AB (ref 14–54)
AST: 20 U/L (ref 15–41)
Bilirubin, Direct: <0.1 mg/dL — ABNORMAL LOW (ref 0.1–0.5)
TOTAL PROTEIN: 6.8 g/dL (ref 6.5–8.1)
Total Bilirubin: 0.4 mg/dL (ref 0.3–1.2)

## 2016-02-21 LAB — CBC
HEMATOCRIT: 37.4 % (ref 36.0–46.0)
Hemoglobin: 12.6 g/dL (ref 12.0–15.0)
MCH: 31 pg (ref 26.0–34.0)
MCHC: 33.7 g/dL (ref 30.0–36.0)
MCV: 92.1 fL (ref 78.0–100.0)
Platelets: 220 10*3/uL (ref 150–400)
RBC: 4.06 MIL/uL (ref 3.87–5.11)
RDW: 13.6 % (ref 11.5–15.5)
WBC: 6.7 10*3/uL (ref 4.0–10.5)

## 2016-02-21 LAB — EXPECTORATED SPUTUM ASSESSMENT W REFEX TO RESP CULTURE

## 2016-02-21 LAB — LIPASE, BLOOD: Lipase: 18 U/L (ref 11–51)

## 2016-02-21 MED ORDER — OXYCODONE-ACETAMINOPHEN 5-325 MG PO TABS: 1.0000 | ORAL_TABLET | ORAL | Status: DC | PRN

## 2016-02-21 MED ORDER — ENOXAPARIN SODIUM 40 MG/0.4ML ~~LOC~~ SOLN
40.0000 mg | SUBCUTANEOUS | Status: DC
  Administered 2016-02-21 – 2016-02-22 (×2): 40 mg via SUBCUTANEOUS
  Filled 2016-02-21 (×2): qty 0.4

## 2016-02-21 MED ORDER — GUAIFENESIN ER 600 MG PO TB12
1200.0000 mg | ORAL_TABLET | Freq: Two times a day (BID) | ORAL | Status: DC
  Administered 2016-02-21 – 2016-02-22 (×2): 1200 mg via ORAL
  Filled 2016-02-21 (×2): qty 2

## 2016-02-21 MED ORDER — DEXTROSE 5 % IV SOLN
500.0000 mg | INTRAVENOUS | Status: DC
  Administered 2016-02-21: 500 mg via INTRAVENOUS
  Filled 2016-02-21 (×2): qty 500

## 2016-02-21 MED ORDER — KETOROLAC TROMETHAMINE 30 MG/ML IJ SOLN
30.0000 mg | Freq: Four times a day (QID) | INTRAMUSCULAR | Status: DC
  Administered 2016-02-21 – 2016-02-22 (×3): 30 mg via INTRAVENOUS
  Filled 2016-02-21 (×3): qty 1

## 2016-02-21 MED ORDER — AMLODIPINE BESYLATE 5 MG PO TABS
10.0000 mg | ORAL_TABLET | Freq: Every day | ORAL | Status: DC
  Administered 2016-02-21: 10 mg via ORAL
  Filled 2016-02-21 (×2): qty 2

## 2016-02-21 MED ORDER — MORPHINE SULFATE (PF) 2 MG/ML IV SOLN
2.0000 mg | INTRAVENOUS | Status: DC | PRN
  Administered 2016-02-21 (×2): 2 mg via INTRAVENOUS
  Filled 2016-02-21 (×2): qty 1

## 2016-02-21 MED ORDER — ONDANSETRON HCL 4 MG/2ML IJ SOLN
4.0000 mg | Freq: Four times a day (QID) | INTRAMUSCULAR | Status: DC | PRN
  Administered 2016-02-21: 4 mg via INTRAVENOUS
  Filled 2016-02-21: qty 2

## 2016-02-21 MED ORDER — PRAVASTATIN SODIUM 40 MG PO TABS
40.0000 mg | ORAL_TABLET | Freq: Every day | ORAL | Status: DC
  Administered 2016-02-21 – 2016-02-22 (×2): 40 mg via ORAL
  Filled 2016-02-21: qty 1
  Filled 2016-02-21: qty 4

## 2016-02-21 MED ORDER — ASPIRIN EC 81 MG PO TBEC
81.0000 mg | DELAYED_RELEASE_TABLET | Freq: Every day | ORAL | Status: DC
  Administered 2016-02-21 – 2016-02-22 (×2): 81 mg via ORAL
  Filled 2016-02-21 (×2): qty 1

## 2016-02-21 MED ORDER — ALPRAZOLAM 1 MG PO TABS
1.0000 mg | ORAL_TABLET | Freq: Every evening | ORAL | Status: DC | PRN
  Administered 2016-02-21: 1 mg via ORAL
  Filled 2016-02-21: qty 1

## 2016-02-21 MED ORDER — DEXTROSE 5 % IV SOLN
1.0000 g | INTRAVENOUS | Status: DC
  Administered 2016-02-21: 1 g via INTRAVENOUS
  Filled 2016-02-21 (×2): qty 10

## 2016-02-21 MED ORDER — IPRATROPIUM-ALBUTEROL 0.5-2.5 (3) MG/3ML IN SOLN
3.0000 mL | Freq: Four times a day (QID) | RESPIRATORY_TRACT | Status: DC
  Administered 2016-02-21 – 2016-02-22 (×2): 3 mL via RESPIRATORY_TRACT
  Filled 2016-02-21 (×2): qty 3

## 2016-02-21 NOTE — H&P (Signed)
TRH H&P   Patient Demographics:    Dawn Mckay, is a 65 y.o. female  MRN: [id number]   DOB - March 23, 1951  Admit Date - 02/20/2016  Outpatient Primary MD for the patient is No PCP Per Patient  Referring MD/NP/PA: Dr Venita Sheffield  Patient coming from: Home  Chief Complaint  Patient presents with  . Chest Pain      HPI:    Dawn Mckay  is a 65 y.o. female, History of hyperlipidemia, COPD, CAD who came to the hospital with chief complaint of left-sided chest pain since 3 PM. Patient says that she also has been having shortness of breath, fever and chills. She also complains of nausea generalized body aches for pulse 2 days. The pain was felt as pressure like sensation, she has a history of CAD. She used nitroglycerin 6  as well as the aspirin 4 with no relief of chest pain. In the ED chest x-ray showed opacity in left side of lung. CT angiogram was negative for PE but showed infiltrate versus mass. Patient started on ceftriaxone and Zithromax the ED. She also complains of dysuria for past few  Days and has tried over-the-counter Azo tablets with no relief.    Review of systems:    In addition to the HPI above,   + Fever-chills, No Headache, No changes with Vision or hearing, No problems swallowing food or Liquids, +Chest pain, Cough or Shortness of Breath, No Abdominal pain,+ Nausea +Vomiting, Bowel movements are regular, No Blood in stool or Urine, No dysuria, No new skin rashes or bruises, No new joints pains-aches,  No new weakness, tingling, numbness in any extremity, No recent weight gain or loss, No polyuria, polydypsia or polyphagia, No significant Mental Stressors.  A full 10 point Review of Systems was done, except as stated above, all other Review of Systems were negative.   With Past History of the following :    Past Medical History  Diagnosis Date  . Hypertension   .  CAD (coronary artery disease)   . History of breast cancer   . History of stress test 07/2013    normal  . Chronic headache   . GERD (gastroesophageal reflux disease)   . Chronic chest pain   . History of stress test 2003    normal adenosine cardiolite study      Past Surgical History  Procedure Laterality Date  . Cesarean section    . Breast surgery      due to Cancer  . Throat surgery    . Brain surgery    . Eye surgery        Social History:     Social History  Substance Use Topics  . Smoking status: Current Every Day Smoker -- 0.25 packs/day    Types: Cigarettes  . Smokeless tobacco: Not on file  . Alcohol Use: No        Family History :   Positive family history of cancer and CAD. Patient's brother has a history of CAD  status post stent placement. Patient's mother and brother had colon cancer and lung cancers effectively.   Home Medications:   Prior to Admission medications   Medication Sig Start Date End Date Taking? Authorizing Provider  ALPRAZolam [name]) 1 MG tablet Take 1 mg by mouth at bedtime as needed for anxiety or sleep.    Historical Provider, MD  amLODipine (NORVASC) 10 MG tablet Take 10 mg by mouth daily.    Historical Provider, MD  Ascorbic Acid (VITAMIN C PO) Take 1 tablet by mouth daily.    Historical Provider, MD  aspirin EC 81 MG tablet Take 81 mg by mouth daily.    Historical Provider, MD  Cholecalciferol (VITAMIN D PO) Take 1 tablet by mouth daily.    Historical Provider, MD  docusate sodium (COLACE) 100 MG capsule Take 200 mg by mouth daily.    Historical Provider, MD  HYDROcodone-acetaminophen (NORCO/VICODIN) 5-325 MG per tablet Take 1 tablet by mouth every 6 (six) hours as needed. 09/25/14   Milton Ferguson, MD  meloxicam (MOBIC) 15 MG tablet Take 1 tablet (15 mg total) by mouth daily as needed for pain. 06/23/14   Frazier Richards, MD  morphine (MSIR) 30 MG tablet Take 1 tablet (30 mg total) by mouth every 4 (four) hours as needed for severe  pain. 06/23/14   Frazier Richards, MD  nitroGLYCERIN (NITROSTAT) 0.4 MG SL tablet Place 0.4 mg under the tongue every 5 (five) minutes as needed for chest pain.    Historical Provider, MD  polyvinyl alcohol (LIQUIFILM TEARS) 1.4 % ophthalmic solution Place 1 drop into both eyes as needed for dry eyes.    Historical Provider, MD  potassium chloride (K-DUR,KLOR-CON) 10 MEQ tablet Take 10 mEq by mouth daily.    Historical Provider, MD  pravastatin (PRAVACHOL) 40 MG tablet Take 40 mg by mouth daily.    Historical Provider, MD     Allergies:       Physical Exam:   Vitals  Blood pressure 131/61, pulse 68, temperature 98.3 F (36.8 C), temperature source Oral, resp. rate 21, height 5\' 3"  (1.6 m), weight 68.04 kg (150 lb), SpO2 97 %.   1. General Caucasian female lying in bed in NAD, cooperative with exam*  2. Normal affect and insight, Not Suicidal or Homicidal, Awake Alert, Oriented X 3.  3. No F.N deficits, ALL C.Nerves Intact, Strength 5/5 all 4 extremities, Sensation intact all 4 extremities, Plantars down going.  4. Ears and Eyes appear Normal, Conjunctivae clear, PERRLA. Moist Oral Mucosa.  5. Supple Neck, No JVD, No cervical lymphadenopathy appriciated, No Carotid Bruits.  6. Symmetrical Chest wall movement, Good air movement bilaterally, bibasilar rhonchi.  7. RRR, No Gallops, Rubs or Murmurs, No Parasternal Heave.  8. Positive Bowel Sounds, Abdomen Soft, No tenderness, No organomegaly appriciated,No rebound -guarding or rigidity.  9.  No Cyanosis, Normal Skin Turgor, No Skin Rash or Bruise.  10. Good muscle tone,  joints appear normal , no effusions, Normal ROM.      Data Review:    CBC  Recent Labs Lab 02/20/16 2035  WBC 8.0  HGB 14.1  HCT 41.8  PLT 258  MCV 91.3  MCH 30.8  MCHC 33.7  RDW 13.6   ------------------------------------------------------------------------------------------------------------------  Chemistries   Recent Labs Lab 02/20/16 2035    NA 138  K 3.9  CL 105  CO2 23  GLUCOSE 112*  BUN 5*  CREATININE 0.77  CALCIUM 8.9  AST 20  ALT 10*  ALKPHOS 69  BILITOT 0.4   ------------------------------------------------------------------------------------------------------------------ estimated creatinine clearance is 66.6 mL/min (by C-G formula based on Cr of 0.77). ------------------------------------------------------------------------------------------------------------------ No results for input(s): TSH, T4TOTAL, T3FREE, THYROIDAB in the last 72 hours.  Invalid input(s): FREET3  Coagulation profile No results for input(s): INR, PROTIME in the last 168 hours. ------------------------------------------------------------------------------------------------------------------- No results for input(s): DDIMER in the last 72 hours. -------------------------------------------------------------------------------------------------------------------  Cardiac Enzymes  Recent Labs Lab 02/20/16 2035  TROPONINI <0.03   ------------------------------------------------------------------------------------------------------------------    Component Value Date/Time   BNP 20.0 09/25/2014 0250     ---------------------------------------------------------------------------------------------------------------  Urinalysis    Component Value Date/Time   COLORURINE YELLOW 12/15/2014 1418   APPEARANCEUR CLEAR 12/15/2014 1418   LABSPEC <1.005* 12/15/2014 1418   PHURINE 7.0 12/15/2014 1418   GLUCOSEU NEGATIVE 12/15/2014 1418   HGBUR SMALL* 12/15/2014 1418   BILIRUBINUR NEGATIVE 12/15/2014 1418   KETONESUR NEGATIVE 12/15/2014 1418   PROTEINUR NEGATIVE 12/15/2014 1418   UROBILINOGEN 0.2 12/15/2014 1418   NITRITE NEGATIVE 12/15/2014 1418   LEUKOCYTESUR NEGATIVE 12/15/2014 1418    ----------------------------------------------------------------------------------------------------------------   Imaging Results:    Dg Chest 2  View  02/20/2016  CLINICAL DATA:  Left chest pain and pressure, onset at 15:00. EXAM: CHEST  2 VIEW COMPARISON:  09/25/2014 FINDINGS: There is consolidation in the left lower lobe. This may represent pneumonia. The right lung is clear. The pulmonary vasculature is normal. There is no pleural effusion. Hilar and mediastinal contours are unremarkable and unchanged. IMPRESSION: Left lower lobe consolidation, possibly pneumonia. Followup PA and lateral chest X-ray is recommended in 3-4 weeks following trial of antibiotic therapy to ensure resolution and exclude underlying malignancy. Electronically Signed   By: Andreas Newport M.D.   On: 02/20/2016 21:25   Ct Angio Chest Pe W Or Wo Contrast  02/20/2016  CLINICAL DATA:  Sudden onset left chest pain, 6 hours duration. EXAM: CT ANGIOGRAPHY CHEST WITH CONTRAST TECHNIQUE: Multidetector CT imaging of the chest was performed using the standard protocol during bolus administration of intravenous contrast. Multiplanar CT image reconstructions and MIPs were obtained to evaluate the vascular anatomy. CONTRAST:  100 mL Isovue 370 intravenous COMPARISON:  08/04/2013 FINDINGS: Cardiovascular: There is good opacification of the pulmonary arteries. There is no pulmonary embolism. The thoracic aorta is normal in caliber and intact. Lungs: Confluent airspace consolidation with air bronchograms in the left lower lobe base, predominantly the anterior basal segment. Is could represent pneumonia. Linear scarring in the right middle lobe and lingula is unchanged. Central airways: Patent Effusions: None Lymphadenopathy: None Esophagus: Unremarkable Upper abdomen: Hiatal hernia Musculoskeletal: No significant abnormality Review of the MIP images confirms the above findings. IMPRESSION: 1. Negative for acute pulmonary embolism. 2. Left lower lobe airspace consolidation. This could represent pneumonia. This is visible on radiography, and follow-up radiography is necessary to confirm  complete clearing. 3. Hiatal hernia Electronically Signed   By: Andreas Newport M.D.   On: 02/20/2016 23:36      Assessment & Plan:    Active Problems:   Chest pain   Essential hypertension   CAP (community acquired pneumonia)     1. Community-acquired pneumonia- start antibiotics ceftriaxone and Zithromax. Will obtain urinary strep pneumo antigen. Follow blood culture results. Patient will need to have a repeat chest x-ray in 3-4 weeks to check for the resolution of pneumonia. Otherwise workup for lung mass needs to be initiated. 2. Hypertension- blood pressure stable, continue amlodipine 10 mg by mouth daily 3. Chest pain- likely from community-acquired pneumonia, she does have a history of CAD.  Will obtain serial troponin. First troponin the ED negative    DVT Prophylaxis-   Lovenox   AM Labs Ordered, also please review Full Orders  Family Communication:  No family present at bedside  Code Status:  Full code  Admission status: Observation  Time spent in minutes : 60 min   Janani Chamber S M.D on 02/21/2016 at 12:56 AM  Between 7am to 7pm - Pager - [phone]. After 7pm go to [email or web address] - password [email or web address]  [facility] - Office  [phone]

## 2016-02-21 NOTE — Progress Notes (Signed)
This patient was admitted to the hospital earlier today by Dr. Darrick Meigs.  Patient seen and examined. She continues to complain of pleuritic chest pain and cough. She has a productive cough. Lungs are mostly clear. Exam is otherwise unremarkable  Patient has been admitted with community acquired pneumonia with associated pleuritic chest pain. Cardiac enzymes have thus far been negative. She is currently on intravenous antibiotics. Will continue with antibiotics and pulmonary hygiene for another 24 hours. Anticipate she'll be able to discharge home tomorrow with oral antibiotics. She will need a repeat x-ray in 3-4 weeks.  Dawn Mckay

## 2016-02-22 DIAGNOSIS — G894 Chronic pain syndrome: Secondary | ICD-10-CM

## 2016-02-22 DIAGNOSIS — E782 Mixed hyperlipidemia: Secondary | ICD-10-CM | POA: Diagnosis present

## 2016-02-22 DIAGNOSIS — E785 Hyperlipidemia, unspecified: Secondary | ICD-10-CM

## 2016-02-22 LAB — HIV ANTIBODY (ROUTINE TESTING W REFLEX): HIV SCREEN 4TH GENERATION: NONREACTIVE

## 2016-02-22 MED ORDER — AZITHROMYCIN 500 MG PO TABS: 500.0000 mg | ORAL_TABLET | Freq: Every day | ORAL | Status: DC

## 2016-02-22 MED ORDER — GUAIFENESIN ER 600 MG PO TB12: 600.0000 mg | ORAL_TABLET | Freq: Two times a day (BID) | ORAL | Status: DC

## 2016-02-22 MED ORDER — ALBUTEROL SULFATE (2.5 MG/3ML) 0.083% IN NEBU: 2.5000 mg | INHALATION_SOLUTION | Freq: Four times a day (QID) | RESPIRATORY_TRACT | Status: DC | PRN

## 2016-02-22 MED ORDER — CEFUROXIME AXETIL 500 MG PO TABS: 500.0000 mg | ORAL_TABLET | Freq: Two times a day (BID) | ORAL | Status: DC

## 2016-02-22 MED ORDER — ALBUTEROL SULFATE HFA 108 (90 BASE) MCG/ACT IN AERS: 2.0000 | INHALATION_SPRAY | Freq: Four times a day (QID) | RESPIRATORY_TRACT | Status: AC | PRN

## 2016-02-22 MED ORDER — AMOXICILLIN-POT CLAVULANATE 875-125 MG PO TABS: 1.0000 | ORAL_TABLET | Freq: Two times a day (BID) | ORAL | Status: DC

## 2016-02-22 NOTE — Discharge Summary (Addendum)
Physician Discharge Summary  Dawn Mckay [id number] DOB: 1951/02/21 DOA: 02/20/2016  PCP: Patient goes to the [facility] date: 02/20/2016 Discharge date: 02/22/2016  Admitted From: home Disposition:  Home  Recommendations for Outpatient Follow-up:  1. Follow up with PCP in 1-2 weeks 2. Please obtain BMP/CBC in one week 3. Recommend repeat CXR in 3-4 weeks for resolution of CAP  Home Health: No Equipment/Devices: None  Discharge Condition: Stable CODE STATUS: Full Diet recommendation: Heart healthy   Brief/Interim Summary: 51 yof with history of HLD, COPD, and CAD presented with complaints of left-sided chest with associated SOB, fever, chills, nausea, and generalized body aches. In the ED CXR revealed opacity in the left side of the lung. CT angio negative for PE but revealed infiltrate vs mass. She was started on IV abx and admitted for further treatment.   Discharge Diagnoses:  Active Problems:   Chest pain   Essential hypertension   CAP (community acquired pneumonia)   HLD (hyperlipidemia)   Chronic pain syndrome  Patient was admitted for treatment of CAP with pleuritic chest pain. Initially started on IV abx with rocephin and Azithromycin. She has slowly improved and did not require supplemental oxygen. On discharge noted to have improvement in symptoms and was transitioned to oral abx. Recommend repeat CXR in 3-4 weeks to confirm resolution of pneumonia.  1. Essential hypertension. Stable. Continue outpatient regimen.  2. HLD. Continue statin.  3. Chronic pain. Continue narcotic pain regimen prior to admission.  4. Pleuritic chest pain. Related to pneumonia. Improved with treatment.    Discharge Instructions      Discharge Instructions    DME Nebulizer machine    Complete by:  As directed      Diet - low sodium heart healthy    Complete by:  As directed      Increase activity slowly    Complete by:  As directed             Medication List    TAKE these  medications        albuterol 108 (90 Base) MCG/ACT inhaler  Commonly known as:  PROVENTIL HFA;VENTOLIN HFA  Inhale 2 puffs into the lungs every 6 (six) hours as needed for wheezing or shortness of breath.     ALPRAZolam 1 MG tablet  Commonly known as:  XANAX  Take 1 mg by mouth at bedtime as needed for anxiety or sleep.     amLODipine 10 MG tablet  Commonly known as:  NORVASC  Take 10 mg by mouth daily.     aspirin EC 81 MG tablet  Take 81 mg by mouth daily.     azithromycin 500 MG tablet  Commonly known as:  ZITHROMAX  Take 1 tablet (500 mg total) by mouth daily.     cefUROXime 500 MG tablet  Commonly known as:  CEFTIN  Take 1 tablet (500 mg total) by mouth 2 (two) times daily with a meal.     docusate sodium 100 MG capsule  Commonly known as:  COLACE  Take 200 mg by mouth daily.     guaiFENesin 600 MG 12 hr tablet  Commonly known as:  MUCINEX  Take 1 tablet (600 mg total) by mouth 2 (two) times daily.     HYDROcodone-acetaminophen 5-325 MG tablet  Commonly known as:  NORCO/VICODIN  Take 1 tablet by mouth every 6 (six) hours as needed.     meloxicam 15 MG tablet  Commonly known as:  MOBIC  Take 1  tablet (15 mg total) by mouth daily as needed for pain.     morphine 30 MG tablet  Commonly known as:  MSIR  Take 1 tablet (30 mg total) by mouth every 4 (four) hours as needed for severe pain.     nitroGLYCERIN 0.4 MG SL tablet  Commonly known as:  NITROSTAT  Place 0.4 mg under the tongue every 5 (five) minutes as needed for chest pain.     polyvinyl alcohol 1.4 % ophthalmic solution  Commonly known as:  LIQUIFILM TEARS  Place 1 drop into both eyes daily.     potassium chloride 10 MEQ tablet  Commonly known as:  K-DUR,KLOR-CON  Take 10 mEq by mouth daily.     pravastatin 40 MG tablet  Commonly known as:  PRAVACHOL  Take 40 mg by mouth daily.     VITAMIN D PO  Take 1 tablet by mouth daily.       Follow-up Information    Follow up with Follow up with your  primary doctor in 1-2 weeks.   Why:  BMP/CR in 1 week; Repeat chest xray in 4 weeks to ensure resolution of pneumonia     No Known Allergies  Consultations:     Procedures/Studies: Dg Chest 2 View  02/20/2016  CLINICAL DATA:  Left chest pain and pressure, onset at 15:00. EXAM: CHEST  2 VIEW COMPARISON:  09/25/2014 FINDINGS: There is consolidation in the left lower lobe. This may represent pneumonia. The right lung is clear. The pulmonary vasculature is normal. There is no pleural effusion. Hilar and mediastinal contours are unremarkable and unchanged. IMPRESSION: Left lower lobe consolidation, possibly pneumonia. Followup PA and lateral chest X-ray is recommended in 3-4 weeks following trial of antibiotic therapy to ensure resolution and exclude underlying malignancy. Electronically Signed   By: Andreas Newport M.D.   On: 02/20/2016 21:25   Ct Angio Chest Pe W Or Wo Contrast  02/20/2016  CLINICAL DATA:  Sudden onset left chest pain, 6 hours duration. EXAM: CT ANGIOGRAPHY CHEST WITH CONTRAST TECHNIQUE: Multidetector CT imaging of the chest was performed using the standard protocol during bolus administration of intravenous contrast. Multiplanar CT image reconstructions and MIPs were obtained to evaluate the vascular anatomy. CONTRAST:  100 mL Isovue 370 intravenous COMPARISON:  08/04/2013 FINDINGS: Cardiovascular: There is good opacification of the pulmonary arteries. There is no pulmonary embolism. The thoracic aorta is normal in caliber and intact. Lungs: Confluent airspace consolidation with air bronchograms in the left lower lobe base, predominantly the anterior basal segment. Is could represent pneumonia. Linear scarring in the right middle lobe and lingula is unchanged. Central airways: Patent Effusions: None Lymphadenopathy: None Esophagus: Unremarkable Upper abdomen: Hiatal hernia Musculoskeletal: No significant abnormality Review of the MIP images confirms the above findings. IMPRESSION: 1.  Negative for acute pulmonary embolism. 2. Left lower lobe airspace consolidation. This could represent pneumonia. This is visible on radiography, and follow-up radiography is necessary to confirm complete clearing. 3. Hiatal hernia Electronically Signed   By: Andreas Newport M.D.   On: 02/20/2016 23:36   (Echo, Carotid, EGD, Colonoscopy, ERCP)    Subjective:   Discharge Exam: Filed Vitals:   02/22/16 0554 02/22/16 1108  BP: 105/67 107/59  Pulse: 80 74  Temp: 98 F (36.7 C) 98.1 F (36.7 C)  Resp: 18    Filed Vitals:   02/21/16 2053 02/22/16 0554 02/22/16 0756 02/22/16 1108  BP: 118/62 105/67  107/59  Pulse: 82 80  74  Temp: 98.7 F (37.1 C)  45 F (36.7 C)  98.1 F (36.7 C)  TempSrc: Oral Oral  Oral  Resp: 20 18    Height:      Weight:      SpO2: 92% 93% 93% 93%    General: Pt is alert, awake, not in acute distress Cardiovascular: RRR, S1/S2 +, no rubs, no gallops Respiratory: CTA bilaterally, no wheezing, no rhonchi Abdominal: Soft, NT, ND, bowel sounds + Extremities: no edema, no cyanosis    The results of significant diagnostics from this hospitalization (including imaging, microbiology, ancillary and laboratory) are listed below for reference.     Microbiology: Recent Results (from the past 240 hour(s))  Culture, blood (routine x 2) Call MD if unable to obtain prior to antibiotics being given     Status: None (Preliminary result)   Collection Time: 02/21/16  3:14 AM  Result Value Ref Range Status   Specimen Description LEFT ANTECUBITAL  Final   Special Requests BOTTLES DRAWN AEROBIC AND ANAEROBIC 6CC  Final   Culture NO GROWTH 1 DAY  Final   Report Status PENDING  Incomplete  Culture, blood (routine x 2) Call MD if unable to obtain prior to antibiotics being given     Status: None (Preliminary result)   Collection Time: 02/21/16  3:20 AM  Result Value Ref Range Status   Specimen Description BLOOD LEFT ARM  Final   Special Requests BOTTLES DRAWN AEROBIC  AND ANAEROBIC 6CC  Final   Culture NO GROWTH 1 DAY  Final   Report Status PENDING  Incomplete  Culture, sputum-assessment     Status: None   Collection Time: 02/21/16  2:55 PM  Result Value Ref Range Status   Specimen Description SPUTUM EXPECTORATED  Final   Special Requests NONE  Final   Sputum evaluation   Final    Sputum specimen not acceptable for testing.  Please recollect.   CALLED TO NURSE GARZON, S. AT 1706 ON 02/21/2016 BY AGUNDIZ,E.   Report Status 02/21/2016 FINAL  Final     Labs: BNP (last 3 results) No results for input(s): BNP in the last 8760 hours. Basic Metabolic Panel:  Recent Labs Lab 02/20/16 2035 02/21/16 0622  NA 138 137  K 3.9 3.6  CL 105 105  CO2 23 26  GLUCOSE 112* 98  BUN 5* <5*  CREATININE 0.77 0.72  CALCIUM 8.9 8.1*   Liver Function Tests:  Recent Labs Lab 02/20/16 2035 02/21/16 0622  AST 20 16  ALT 10* 8*  ALKPHOS 69 48  BILITOT 0.4 0.5  PROT 6.8 5.9*  ALBUMIN 4.1 3.3*    Recent Labs Lab 02/20/16 2035  LIPASE 18   No results for input(s): AMMONIA in the last 168 hours. CBC:  Recent Labs Lab 02/20/16 2035 02/21/16 0622  WBC 8.0 6.7  HGB 14.1 12.6  HCT 41.8 37.4  MCV 91.3 92.1  PLT 258 220   Cardiac Enzymes:  Recent Labs Lab 02/20/16 2035 02/21/16 0314 02/21/16 0905 02/21/16 1328  TROPONINI <0.03 <0.03 <0.03 <0.03   Urinalysis    Component Value Date/Time   COLORURINE YELLOW 12/15/2014 1418   APPEARANCEUR CLEAR 12/15/2014 1418   LABSPEC <1.005* 12/15/2014 1418   PHURINE 7.0 12/15/2014 1418   GLUCOSEU NEGATIVE 12/15/2014 1418   HGBUR SMALL* 12/15/2014 1418   BILIRUBINUR NEGATIVE 12/15/2014 1418   KETONESUR NEGATIVE 12/15/2014 1418   PROTEINUR NEGATIVE 12/15/2014 1418   UROBILINOGEN 0.2 12/15/2014 1418   NITRITE NEGATIVE 12/15/2014 1418   LEUKOCYTESUR NEGATIVE 12/15/2014 1418   Sepsis Labs  Invalid input(s): PROCALCITONIN,  WBC,  LACTICIDVEN Microbiology Recent Results (from the past 240 hour(s))   Culture, blood (routine x 2) Call MD if unable to obtain prior to antibiotics being given     Status: None (Preliminary result)   Collection Time: 02/21/16  3:14 AM  Result Value Ref Range Status   Specimen Description LEFT ANTECUBITAL  Final   Special Requests BOTTLES DRAWN AEROBIC AND ANAEROBIC 6CC  Final   Culture NO GROWTH 1 DAY  Final   Report Status PENDING  Incomplete  Culture, blood (routine x 2) Call MD if unable to obtain prior to antibiotics being given     Status: None (Preliminary result)   Collection Time: 02/21/16  3:20 AM  Result Value Ref Range Status   Specimen Description BLOOD LEFT ARM  Final   Special Requests BOTTLES DRAWN AEROBIC AND ANAEROBIC 6CC  Final   Culture NO GROWTH 1 DAY  Final   Report Status PENDING  Incomplete  Culture, sputum-assessment     Status: None   Collection Time: 02/21/16  2:55 PM  Result Value Ref Range Status   Specimen Description SPUTUM EXPECTORATED  Final   Special Requests NONE  Final   Sputum evaluation   Final    Sputum specimen not acceptable for testing.  Please recollect.   CALLED TO NURSE GARZON, S. AT 1706 ON 02/21/2016 BY AGUNDIZ,E.   Report Status 02/21/2016 FINAL  Final     Time coordinating discharge: Over 30 minutes  SIGNED:   Kathie Dike, MD  [facility] 02/22/2016, 1:40 PM Pager   If 7PM-7AM, please contact night-coverage [email or web address] Password [email or web address]    By signing my name below, I, Rennis Harding, attest that this documentation has been prepared under the direction and in the presence of Kathie Dike, MD. Electronically signed: Rennis Harding, Scribe. 02/22/2016 12:06pm   I, Dr. Kathie Dike, personally performed the services described in this documentaiton. All medical record entries made by the scribe were at my direction and in my presence. I have reviewed the chart and agree that the record reflects my personal performance and is accurate and complete  Kathie Dike, MD, 02/22/2016 1:40  PM

## 2016-02-22 NOTE — Progress Notes (Signed)
SATURATION QUALIFICATIONS:  Patient Saturations on Room Air at Rest = 94%  Patient Saturations on Room Air while Ambulating = 93%  Patient ambulated around unit on RA.  Tolerated well.  Dr. Roderic Palau notified via text page.

## 2016-02-22 NOTE — Progress Notes (Signed)
AVS reviewed with patient.  Verbalized understanding of discharge instructions, physician follow-up instructions, and medications.  Patient's IV removed.  Site WNL.  Paitent escorted by NT for discharge.  Patient stable at time of discharge.

## 2016-02-27 LAB — CULTURE, BLOOD (ROUTINE X 2)
CULTURE: NO GROWTH
CULTURE: NO GROWTH

## 2016-04-24 ENCOUNTER — Encounter (HOSPITAL_COMMUNITY): Payer: Self-pay | Admitting: Emergency Medicine

## 2016-04-24 ENCOUNTER — Emergency Department (HOSPITAL_COMMUNITY)
Admission: EM | Admit: 2016-04-24 | Discharge: 2016-04-25 | Disposition: A | Discharge status: Home / Self Care | Payer: Self-pay | Attending: Emergency Medicine | Admitting: Emergency Medicine

## 2016-04-24 DIAGNOSIS — F17210 Nicotine dependence, cigarettes, uncomplicated: Secondary | ICD-10-CM | POA: Insufficient documentation

## 2016-04-24 DIAGNOSIS — Z79899 Other long term (current) drug therapy: Secondary | ICD-10-CM | POA: Insufficient documentation

## 2016-04-24 DIAGNOSIS — J40 Bronchitis, not specified as acute or chronic: Secondary | ICD-10-CM | POA: Insufficient documentation

## 2016-04-24 DIAGNOSIS — R079 Chest pain, unspecified: Secondary | ICD-10-CM

## 2016-04-24 DIAGNOSIS — I2510 Atherosclerotic heart disease of native coronary artery without angina pectoris: Secondary | ICD-10-CM | POA: Insufficient documentation

## 2016-04-24 DIAGNOSIS — I10 Essential (primary) hypertension: Secondary | ICD-10-CM | POA: Insufficient documentation

## 2016-04-24 DIAGNOSIS — Z7982 Long term (current) use of aspirin: Secondary | ICD-10-CM | POA: Insufficient documentation

## 2016-04-24 DIAGNOSIS — Z853 Personal history of malignant neoplasm of breast: Secondary | ICD-10-CM | POA: Insufficient documentation

## 2016-04-24 NOTE — ED Provider Notes (Signed)
[facility] DEPT Provider Note   CSN: [id number] Arrival date & time: 04/24/16  2346   By signing my name below, I, Dawn Mckay, attest that this documentation has been prepared under the direction and in the presence of [name], MD . Electronically Signed: Estanislado Mckay, Scribe. 04/24/2016. 5:02 AM.   History   Chief Complaint Chief Complaint  Patient presents with  . Chest Pain    unrelieved by NTG x 3     The history is provided by the patient. No language interpreter was used.  HPI Comments:  Dawn Mckay is a 65 y.o. female with PMHx of CAD, high cholesterol, GERD, PE, and chronic chest pain who presents to the Emergency Department complaining of sudden onset, constant left-sided chest pain x2 hours. Pt was sitting at the computer when pain began. Pt describes the pain as burning and sharp. Pt complains of associated SOB, vomiting, lightheadedness, and dizziness but not now. Pt denies any previous similar episodes. Pt reports that she had an MRI of her head at the [place] several weeks ago and notes that while there, she became hypertensive and was sent to [place] took NTGx4 with no relief. Pt did not feel well enough to eat lunch or dinner today. Pt is a smoker (3-4 cigarettes per day) and uses NCO2 at home. Denies cough, leg swelling. Has increased anxiety.         Past Medical History:  Diagnosis Date  . CAD (coronary artery disease)   . Chronic chest pain   . Chronic headache   . GERD (gastroesophageal reflux disease)   . History of breast cancer   . History of stress test 07/2013   normal  . History of stress test 2003   normal adenosine cardiolite study  . Hypertension     Patient Active Problem List   Diagnosis Date Noted  . HLD (hyperlipidemia) 02/22/2016  . Chronic pain syndrome 02/22/2016  . CAP (community acquired pneumonia) 02/21/2016  . Essential hypertension   . CAD (coronary artery disease), native coronary artery  06/22/2014  . Syncope 10/26/2013  . 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 UTI (lower urinary tract infection) 10/26/2013  . Migraine headache 10/26/2013  . Chest pain 08/04/2013  . Hypertension 08/04/2013  . Hyponatremia 08/04/2013  . Hypokalemia 08/04/2013  . Tobacco use disorder 08/04/2013    Past Surgical History:  Procedure Laterality Date  . BRAIN SURGERY    . BREAST SURGERY     due to Cancer  . CESAREAN SECTION    . EYE SURGERY    . THROAT SURGERY      OB History    No data available       Home Medications    Prior to Admission medications   Medication Sig Start Date End Date Taking? Authorizing Provider  albuterol (PROVENTIL HFA;VENTOLIN HFA) 108 (90 Base) MCG/ACT inhaler Inhale 2 puffs into the lungs every 6 (six) hours as needed for wheezing or shortness of breath. 02/22/16   Kathie Dike, MD  ALPRAZolam [name]) 1 MG tablet Take 1 mg by mouth at bedtime as needed for anxiety or sleep.    Historical Provider, MD  amLODipine (NORVASC) 10 MG tablet Take 10 mg by mouth daily.    Historical Provider, MD  aspirin EC 81 MG tablet Take 81 mg by mouth daily.    Historical Provider, MD  cefUROXime (CEFTIN) 500 MG tablet Take 1 tablet (500 mg total) by mouth 2 (two) times daily with a meal. 02/22/16  Kathie Dike, MD  Cholecalciferol (VITAMIN D PO) Take 1 tablet by mouth daily.    Historical Provider, MD  docusate sodium (COLACE) 100 MG capsule Take 200 mg by mouth daily.    Historical Provider, MD  doxycycline (VIBRAMYCIN) 100 MG capsule Take 1 capsule (100 mg total) by mouth 2 (two) times daily. One po bid x 7 days 04/25/16   [name], MD  guaiFENesin (MUCINEX) 600 MG 12 hr tablet Take 1 tablet (600 mg total) by mouth 2 (two) times daily. 02/22/16   Kathie Dike, MD  HYDROcodone-acetaminophen (NORCO/VICODIN) 5-325 MG per tablet Take 1 tablet by mouth every 6 (six) hours as needed. Patient taking differently: Take 1 tablet by mouth every 6 (six) hours as needed for moderate pain.   09/25/14   Milton Ferguson, MD  meloxicam (MOBIC) 15 MG tablet Take 1 tablet (15 mg total) by mouth daily as needed for pain. 06/23/14   Frazier Richards, MD  morphine (MSIR) 30 MG tablet Take 1 tablet (30 mg total) by mouth every 4 (four) hours as needed for severe pain. 04/25/16   [name], MD  nitroGLYCERIN (NITROSTAT) 0.4 MG SL tablet Place 0.4 mg under the tongue every 5 (five) minutes as needed for chest pain.    Historical Provider, MD  polyvinyl alcohol (LIQUIFILM TEARS) 1.4 % ophthalmic solution Place 1 drop into both eyes daily.     Historical Provider, MD  potassium chloride (K-DUR,KLOR-CON) 10 MEQ tablet Take 10 mEq by mouth daily.    Historical Provider, MD  pravastatin (PRAVACHOL) 40 MG tablet Take 40 mg by mouth daily.    Historical Provider, MD  predniSONE (DELTASONE) 20 MG tablet 2 tabs po daily x 4 days 04/25/16   [name], MD    Family History No family history on file.  Social History Social History  Substance Use Topics  . Smoking status: Current Every Day Smoker    Packs/day: 0.25    Types: Cigarettes  . Smokeless tobacco: Never Used  . Alcohol use No     Allergies   Review of patient's allergies indicates no known allergies.   Review of Systems Review of Systems  Respiratory: Positive for shortness of breath. Negative for cough.   Cardiovascular: Positive for chest pain. Negative for leg swelling.  Gastrointestinal: Positive for vomiting.  Neurological: Positive for dizziness and light-headedness.  All other systems reviewed and are negative.    Physical Exam Updated Vital Signs BP 157/85   Pulse 85   Temp 97.1 F (36.2 C) (Oral)   Resp 16   Ht 5\' 3"  (1.6 m)   Wt 160 lb (72.6 kg)   SpO2 97%   BMI 28.34 kg/m   Physical Exam  Constitutional: She appears well-developed and well-nourished. No distress.  HENT:  Head: Normocephalic and atraumatic.  Eyes: Conjunctivae are normal.  Cardiovascular: Normal rate.   Pulmonary/Chest: Effort normal.    Faint crackles in bilateral lower lobes, slightly tachypnic.  Abdominal: She exhibits no distension.  Musculoskeletal:  No LE swelling, no tenderness with dorsiflexion of plantarflexion of feet, no calf tenderness  Neurological: She is alert.  Skin: Skin is warm and dry.  No rashes over L chest.  Psychiatric: She has a normal mood and affect.  Nursing note and vitals reviewed.    ED Treatments / Results  DIAGNOSTIC STUDIES:  Oxygen Saturation is 95% on RA, adequate by my interpretation.    COORDINATION OF CARE:  5:02 AM Discussed treatment plan with pt at bedside and  pt agreed to plan.  Labs (all labs ordered are listed, but only abnormal results are displayed) Labs Reviewed  BASIC METABOLIC PANEL - Abnormal; Notable for the following:       Result Value   Sodium 131 (*)    Potassium 3.4 (*)    Glucose, Bld 102 (*)    Calcium 8.8 (*)    All other components within normal limits  HEPATIC FUNCTION PANEL - Abnormal; Notable for the following:    ALT 9 (*)    All other components within normal limits  CBC  LIPASE, BLOOD  D-DIMER, QUANTITATIVE (NOT AT [facility])  TROPONIN I  I-STAT TROPOININ, ED    EKG  EKG Interpretation  Date/Time:  Saturday April 24 2016 23:47:01 EDT Ventricular Rate:  95 PR Interval:    QRS Duration: 78 QT Interval:  348 QTC Calculation: 438 R Axis:   19 Text Interpretation:  Sinus rhythm Anteroseptal infarct, age indeterminate No significant change since last tracing Confirmed by [facility] MD, Damico Partin [id number]) on 04/24/2016 11:57:04 PM       Radiology Dg Chest 2 View  Result Date: 04/25/2016 CLINICAL DATA:  Chest pain. EXAM: CHEST  2 VIEW COMPARISON:  02/20/2016 FINDINGS: Slight fibrosis or atelectasis in the lung bases. No focal consolidation or airspace disease. No blunting of costophrenic angles. No pneumothorax. Calcification of the aorta. Normal heart size and pulmonary vascularity. IMPRESSION: Fibrosis or atelectasis demonstrated in the lung  bases. No focal consolidation. Electronically Signed   By: Lucienne Capers M.D.   On: 04/25/2016 00:47    Procedures Procedures (including critical care time)  Medications Ordered in ED Medications  gi cocktail (Maalox,Lidocaine,Donnatal) (30 mLs Oral Refused 04/25/16 0043)  morphine (MSIR) tablet 30 mg (30 mg Oral Given 04/25/16 0102)  ipratropium-albuterol (DUONEB) 0.5-2.5 (3) MG/3ML nebulizer solution 3 mL (3 mLs Nebulization Given 04/25/16 0408)  ondansetron (ZOFRAN) injection 4 mg (4 mg Intravenous Given 04/25/16 0102)  famotidine (PEPCID) IVPB 20 mg premix (0 mg Intravenous Stopped 04/25/16 0225)  methylPREDNISolone sodium succinate (SOLU-MEDROL) 125 mg/2 mL injection 125 mg (125 mg Intravenous Given 04/25/16 0306)     Initial Impression / Assessment and Plan / ED Course  I have reviewed the triage vital signs and the nursing notes.  Pertinent labs & imaging results that were available during my care of the patient were reviewed by me and considered in my medical decision making (see chart for details).  Clinical Course    D dimer negative, doubt PE.  cxr overall clear aside from atelectasis - 2/2 cough, increased sputum and pleurisy will start on abx, prednisone burst.  Delta troponins and ecgs ok without evolving changes, doubt ACS.  Suspect anxiety plays a role in her symptoms as well.  Likely bronchitis, will need close PCP follow up.   Final Clinical Impressions(s) / ED Diagnoses   Final diagnoses:  Bronchitis  Chest pain, unspecified chest pain type    New Prescriptions Discharge Medication List as of 04/25/2016  4:25 AM    START taking these medications   Details  doxycycline (VIBRAMYCIN) 100 MG capsule Take 1 capsule (100 mg total) by mouth 2 (two) times daily. One po bid x 7 days, Starting Sun 04/25/2016, Print    predniSONE (DELTASONE) 20 MG tablet 2 tabs po daily x 4 days, Print       I personally performed the services described in this documentation, which was  scribed in my presence. The recorded information has been reviewed and is accurate.  [name], MD 04/25/16 [phone]

## 2016-04-24 NOTE — ED Triage Notes (Signed)
CP unrelieved by NTG x 3- wassitting at computer and pain began

## 2016-04-25 ENCOUNTER — Emergency Department (HOSPITAL_COMMUNITY): Payer: Self-pay

## 2016-04-25 LAB — BASIC METABOLIC PANEL
Anion gap: 6 (ref 5–15)
BUN: 7 mg/dL (ref 6–20)
CALCIUM: 8.8 mg/dL — AB (ref 8.9–10.3)
CO2: 23 mmol/L (ref 22–32)
CREATININE: 0.67 mg/dL (ref 0.44–1.00)
Chloride: 102 mmol/L (ref 101–111)
GFR calc Af Amer: >60 mL/min (ref >60)
GLUCOSE: 102 mg/dL — AB (ref 65–99)
Potassium: 3.4 mmol/L — ABNORMAL LOW (ref 3.5–5.1)
Sodium: 131 mmol/L — ABNORMAL LOW (ref 135–145)

## 2016-04-25 LAB — CBC
HCT: 39.3 % (ref 36.0–46.0)
Hemoglobin: 13.6 g/dL (ref 12.0–15.0)
MCH: 31.2 pg (ref 26.0–34.0)
MCHC: 34.6 g/dL (ref 30.0–36.0)
MCV: 90.1 fL (ref 78.0–100.0)
Platelets: 222 10*3/uL (ref 150–400)
RBC: 4.36 MIL/uL (ref 3.87–5.11)
RDW: 13 % (ref 11.5–15.5)
WBC: 7.8 10*3/uL (ref 4.0–10.5)

## 2016-04-25 LAB — I-STAT TROPONIN, ED: TROPONIN I, POC: 0 ng/mL (ref 0.00–0.08)

## 2016-04-25 LAB — HEPATIC FUNCTION PANEL
ALK PHOS: 47 U/L (ref 38–126)
ALT: 9 U/L — ABNORMAL LOW (ref 14–54)
AST: 17 U/L (ref 15–41)
Albumin: 4.2 g/dL (ref 3.5–5.0)
BILIRUBIN INDIRECT: 0.4 mg/dL (ref 0.3–0.9)
BILIRUBIN TOTAL: 0.5 mg/dL (ref 0.3–1.2)
Bilirubin, Direct: 0.1 mg/dL (ref 0.1–0.5)
Total Protein: 6.8 g/dL (ref 6.5–8.1)

## 2016-04-25 LAB — D-DIMER, QUANTITATIVE: D-Dimer, Quant: <0.27 ug/mL-FEU (ref 0.00–0.50)

## 2016-04-25 LAB — TROPONIN I

## 2016-04-25 LAB — LIPASE, BLOOD: Lipase: 21 U/L (ref 11–51)

## 2016-04-25 MED ORDER — PREDNISONE 20 MG PO TABS: ORAL_TABLET | ORAL | 0 refills | Status: DC

## 2016-04-25 MED ORDER — ONDANSETRON HCL 4 MG/2ML IJ SOLN
4.0000 mg | Freq: Once | INTRAMUSCULAR | Status: AC
  Administered 2016-04-25: 4 mg via INTRAVENOUS
  Filled 2016-04-25: qty 2

## 2016-04-25 MED ORDER — GI COCKTAIL ~~LOC~~
30.0000 mL | Freq: Once | ORAL | Status: DC
  Filled 2016-04-25: qty 30

## 2016-04-25 MED ORDER — MORPHINE SULFATE 30 MG PO TABS: 30.0000 mg | ORAL_TABLET | ORAL | 0 refills | Status: DC | PRN

## 2016-04-25 MED ORDER — MORPHINE SULFATE 15 MG PO TABS
30.0000 mg | ORAL_TABLET | ORAL | Status: DC | PRN
  Administered 2016-04-25: 30 mg via ORAL
  Filled 2016-04-25: qty 2

## 2016-04-25 MED ORDER — IPRATROPIUM-ALBUTEROL 0.5-2.5 (3) MG/3ML IN SOLN
3.0000 mL | RESPIRATORY_TRACT | Status: DC
  Administered 2016-04-25: 3 mL via RESPIRATORY_TRACT
  Filled 2016-04-25: qty 3

## 2016-04-25 MED ORDER — METHYLPREDNISOLONE SODIUM SUCC 125 MG IJ SOLR
125.0000 mg | Freq: Once | INTRAMUSCULAR | Status: AC
  Administered 2016-04-25: 125 mg via INTRAVENOUS
  Filled 2016-04-25: qty 2

## 2016-04-25 MED ORDER — FAMOTIDINE IN NACL 20-0.9 MG/50ML-% IV SOLN
20.0000 mg | Freq: Once | INTRAVENOUS | Status: AC
  Administered 2016-04-25: 20 mg via INTRAVENOUS
  Filled 2016-04-25: qty 50

## 2016-04-25 MED ORDER — DOXYCYCLINE HYCLATE 100 MG PO CAPS: 100.0000 mg | ORAL_CAPSULE | Freq: Two times a day (BID) | ORAL | 0 refills | Status: DC

## 2016-04-25 NOTE — ED Notes (Signed)
Pt reports that she has been out of her morphine tabs for 3 weeks,  She has been coughing up thick white phlegm IVs est, labs drawn and sent Dr Dolly Rias in to assess

## 2016-04-25 NOTE — ED Notes (Signed)
Pt out of bed to bathroom via wheelchair

## 2016-04-25 NOTE — ED Notes (Signed)
Pt requesting pain and nausea meds 

## 2016-04-25 NOTE — ED Notes (Signed)
Pt is coughing up blood at this time- Dr Dolly Rias is apprised

## 2016-04-25 NOTE — ED Notes (Signed)
Dr Dolly Rias in to reassess and speak with pt

## 2016-04-25 NOTE — ED Notes (Signed)
Pt reports that she did not eat lunch nor dinner yesterday-  States pain meds have not worked and that her chest is burning- Education regarding need to have something on stomach and to eat given her meds. Also educated regarding Pepcid.

## 2016-05-23 ENCOUNTER — Emergency Department (HOSPITAL_COMMUNITY): Payer: Non-veteran care

## 2016-05-23 ENCOUNTER — Emergency Department (HOSPITAL_COMMUNITY)
Admission: EM | Admit: 2016-05-23 | Discharge: 2016-05-23 | Disposition: A | Discharge status: Home / Self Care | Payer: Non-veteran care | Attending: Emergency Medicine | Admitting: Emergency Medicine

## 2016-05-23 ENCOUNTER — Encounter (HOSPITAL_COMMUNITY): Payer: Self-pay | Admitting: Emergency Medicine

## 2016-05-23 DIAGNOSIS — Z79899 Other long term (current) drug therapy: Secondary | ICD-10-CM | POA: Diagnosis not present

## 2016-05-23 DIAGNOSIS — R51 Headache: Secondary | ICD-10-CM | POA: Insufficient documentation

## 2016-05-23 DIAGNOSIS — I10 Essential (primary) hypertension: Secondary | ICD-10-CM | POA: Diagnosis not present

## 2016-05-23 DIAGNOSIS — Z7982 Long term (current) use of aspirin: Secondary | ICD-10-CM | POA: Diagnosis not present

## 2016-05-23 DIAGNOSIS — I2510 Atherosclerotic heart disease of native coronary artery without angina pectoris: Secondary | ICD-10-CM | POA: Diagnosis not present

## 2016-05-23 DIAGNOSIS — R200 Anesthesia of skin: Secondary | ICD-10-CM | POA: Diagnosis present

## 2016-05-23 DIAGNOSIS — Z853 Personal history of malignant neoplasm of breast: Secondary | ICD-10-CM | POA: Diagnosis not present

## 2016-05-23 DIAGNOSIS — F17210 Nicotine dependence, cigarettes, uncomplicated: Secondary | ICD-10-CM | POA: Diagnosis not present

## 2016-05-23 LAB — BASIC METABOLIC PANEL
ANION GAP: 8 (ref 5–15)
BUN: 5 mg/dL — AB (ref 6–20)
CHLORIDE: 103 mmol/L (ref 101–111)
CO2: 26 mmol/L (ref 22–32)
Calcium: 9.2 mg/dL (ref 8.9–10.3)
Creatinine, Ser: 0.74 mg/dL (ref 0.44–1.00)
GFR calc Af Amer: >60 mL/min (ref >60)
GFR calc non Af Amer: >60 mL/min (ref >60)
GLUCOSE: 137 mg/dL — AB (ref 65–99)
POTASSIUM: 3.5 mmol/L (ref 3.5–5.1)
SODIUM: 137 mmol/L (ref 135–145)

## 2016-05-23 LAB — CBC
HEMATOCRIT: 42.4 % (ref 36.0–46.0)
Hemoglobin: 14.7 g/dL (ref 12.0–15.0)
MCH: 31.6 pg (ref 26.0–34.0)
MCHC: 34.7 g/dL (ref 30.0–36.0)
MCV: 91.2 fL (ref 78.0–100.0)
Platelets: 271 10*3/uL (ref 150–400)
RBC: 4.65 MIL/uL (ref 3.87–5.11)
RDW: 13.3 % (ref 11.5–15.5)
WBC: 8.5 10*3/uL (ref 4.0–10.5)

## 2016-05-23 LAB — URINE MICROSCOPIC-ADD ON

## 2016-05-23 LAB — URINALYSIS, ROUTINE W REFLEX MICROSCOPIC
Bilirubin Urine: NEGATIVE
GLUCOSE, UA: NEGATIVE mg/dL
Ketones, ur: NEGATIVE mg/dL
LEUKOCYTES UA: NEGATIVE
Nitrite: NEGATIVE
PH: 7.5 (ref 5.0–8.0)
Protein, ur: NEGATIVE mg/dL

## 2016-05-23 LAB — CBG MONITORING, ED: Glucose-Capillary: 120 mg/dL — ABNORMAL HIGH (ref 65–99)

## 2016-05-23 MED ORDER — KETOROLAC TROMETHAMINE 30 MG/ML IJ SOLN
10.0000 mg | Freq: Once | INTRAMUSCULAR | Status: AC
  Administered 2016-05-23: 9.9 mg via INTRAVENOUS
  Filled 2016-05-23: qty 1

## 2016-05-23 MED ORDER — SODIUM CHLORIDE 0.9 % IV BOLUS (SEPSIS)
500.0000 mL | Freq: Once | INTRAVENOUS | Status: AC
  Administered 2016-05-23: 500 mL via INTRAVENOUS

## 2016-05-23 NOTE — ED Notes (Signed)
Patient transported to CT 

## 2016-05-23 NOTE — ED Notes (Signed)
MD at bedside. 

## 2016-05-23 NOTE — ED Notes (Signed)
Pt returned to room  

## 2016-05-23 NOTE — ED Triage Notes (Signed)
Pt reports feeling numbness and tingling with headache since last night.  Pt appears to be shaking at this time. 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  PT is stuttering (not new per pt.) Pt alert and oriented and ambulatory.

## 2016-05-23 NOTE — ED Provider Notes (Signed)
[facility] DEPT Provider Note   CSN: [id number] Arrival date & time: 05/23/16  1447     History   Chief Complaint Chief Complaint  Patient presents with  . Numbness    HPI Dawn Mckay is a 65 y.o. female.  HPI Patient presents with concern of ongoing generalized discomfort, and headache. Headache seems new, is left-sided, nonradiating, sharp, severe. Generalized discomfort is ongoing, no clear alleviating or exacerbating factors. Patient allergies history of CAD, COPD, smoking, fibromyalgia. She has recent hospitalization, during which she was diagnosed with stenosis of left coronary artery, 70%. She specifically denies new loss of vision, new asymmetric weakness anywhere, new confusion, new word finding difficulties, speech difficulty.   Smoking cessation provided, particularly in light of this patient's evaluation in the ED.   Past Medical History:  Diagnosis Date  . CAD (coronary artery disease)   . Chronic chest pain   . Chronic headache   . GERD (gastroesophageal reflux disease)   . History of breast cancer   . History of stress test 07/2013   normal  . History of stress test 2003   normal adenosine cardiolite study  . Hypertension     Patient Active Problem List   Diagnosis Date Noted  . HLD (hyperlipidemia) 02/22/2016  . Chronic pain syndrome 02/22/2016  . CAP (community acquired pneumonia) 02/21/2016  . Essential hypertension   . CAD (coronary artery disease), native coronary artery 06/22/2014  . Syncope 10/26/2013  . 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 UTI (lower urinary tract infection) 10/26/2013  . Migraine headache 10/26/2013  . Chest pain 08/04/2013  . Hypertension 08/04/2013  . Hyponatremia 08/04/2013  . Hypokalemia 08/04/2013  . Tobacco use disorder 08/04/2013    Past Surgical History:  Procedure Laterality Date  . BRAIN SURGERY    . BREAST SURGERY     due to Cancer  . CESAREAN SECTION    . EYE SURGERY    . THROAT SURGERY       OB History    No data available       Home Medications    Prior to Admission medications   Medication Sig Start Date End Date Taking? Authorizing Provider  albuterol (PROVENTIL HFA;VENTOLIN HFA) 108 (90 Base) MCG/ACT inhaler Inhale 2 puffs into the lungs every 6 (six) hours as needed for wheezing or shortness of breath. 02/22/16   Kathie Dike, MD  ALPRAZolam [name]) 1 MG tablet Take 1 mg by mouth at bedtime as needed for anxiety or sleep.    Historical Provider, MD  amLODipine (NORVASC) 10 MG tablet Take 10 mg by mouth daily.    Historical Provider, MD  aspirin EC 81 MG tablet Take 81 mg by mouth daily.    Historical Provider, MD  cefUROXime (CEFTIN) 500 MG tablet Take 1 tablet (500 mg total) by mouth 2 (two) times daily with a meal. 02/22/16   Kathie Dike, MD  Cholecalciferol (VITAMIN D PO) Take 1 tablet by mouth daily.    Historical Provider, MD  docusate sodium (COLACE) 100 MG capsule Take 200 mg by mouth daily.    Historical Provider, MD  doxycycline (VIBRAMYCIN) 100 MG capsule Take 1 capsule (100 mg total) by mouth 2 (two) times daily. One po bid x 7 days 04/25/16   [name], MD  guaiFENesin (MUCINEX) 600 MG 12 hr tablet Take 1 tablet (600 mg total) by mouth 2 (two) times daily. 02/22/16   Kathie Dike, MD  HYDROcodone-acetaminophen (NORCO/VICODIN) 5-325 MG per tablet Take 1 tablet by mouth every 6 (  six) hours as needed. Patient taking differently: Take 1 tablet by mouth every 6 (six) hours as needed for moderate pain.  09/25/14   Milton Ferguson, MD  meloxicam (MOBIC) 15 MG tablet Take 1 tablet (15 mg total) by mouth daily as needed for pain. 06/23/14   Frazier Richards, MD  morphine (MSIR) 30 MG tablet Take 1 tablet (30 mg total) by mouth every 4 (four) hours as needed for severe pain. 04/25/16   [name], MD  nitroGLYCERIN (NITROSTAT) 0.4 MG SL tablet Place 0.4 mg under the tongue every 5 (five) minutes as needed for chest pain.    Historical Provider, MD  polyvinyl alcohol  (LIQUIFILM TEARS) 1.4 % ophthalmic solution Place 1 drop into both eyes daily.     Historical Provider, MD  potassium chloride (K-DUR,KLOR-CON) 10 MEQ tablet Take 10 mEq by mouth daily.    Historical Provider, MD  pravastatin (PRAVACHOL) 40 MG tablet Take 40 mg by mouth daily.    Historical Provider, MD  predniSONE (DELTASONE) 20 MG tablet 2 tabs po daily x 4 days 04/25/16   [name], MD    Family History History reviewed. No pertinent family history.  Social History Social History  Substance Use Topics  . Smoking status: Current Every Day Smoker    Packs/day: 0.25    Types: Cigarettes  . Smokeless tobacco: Never Used  . Alcohol use No     Allergies   Review of patient's allergies indicates no known allergies.   Review of Systems Review of Systems  Constitutional:       Per HPI, otherwise negative  HENT:       Per HPI, otherwise negative  Respiratory:       Per HPI, otherwise negative  Cardiovascular:       Per HPI, otherwise negative  Gastrointestinal: Negative for vomiting.  Endocrine:       Negative aside from HPI  Genitourinary:       Neg aside from HPI   Musculoskeletal:       Per HPI, otherwise negative  Skin: Negative.   Neurological: Positive for weakness and headaches. Negative for syncope.     Physical Exam Updated Vital Signs BP (!) 142/101 (BP Location: Left Arm)   Pulse 97   Temp 98 F (36.7 C) (Oral)   Resp 18   Ht 5\' 3"  (1.6 m)   Wt 160 lb (72.6 kg)   SpO2 97%   BMI 28.34 kg/m   Physical Exam  Constitutional: She is oriented to person, place, and time.  Ill-appearing elderly female awake and alert, answering questions properly, sitting upright, speaking clearly.  HENT:  Head: Normocephalic and atraumatic.  Eyes: Conjunctivae and EOM are normal.  Neck: No tracheal deviation present. No thyromegaly present.  Cardiovascular: Normal rate and regular rhythm.   Pulmonary/Chest: Effort normal and breath sounds normal. No stridor. No  respiratory distress.  Abdominal: She exhibits no distension.  Musculoskeletal: She exhibits no edema.  Neurological: She is alert and oriented to person, place, and time. She displays atrophy. She displays no tremor. No cranial nerve deficit. She exhibits normal muscle tone. She displays no seizure activity. Coordination normal.  Skin: Skin is warm.  Psychiatric: She has a normal mood and affect.  Nursing note and vitals reviewed.    ED Treatments / Results  Labs (all labs ordered are listed, but only abnormal results are displayed) Labs Reviewed  BASIC METABOLIC PANEL - Abnormal; Notable for the following:  Result Value   Glucose, Bld 137 (*)    BUN 5 (*)    All other components within normal limits  URINALYSIS, ROUTINE W REFLEX MICROSCOPIC (NOT AT [facility]) - Abnormal; Notable for the following:    Specific Gravity, Urine <1.005 (*)    Hgb urine dipstick SMALL (*)    All other components within normal limits  URINE MICROSCOPIC-ADD ON - Abnormal; Notable for the following:    Squamous Epithelial / LPF 0-5 (*)    Bacteria, UA RARE (*)    All other components within normal limits  CBG MONITORING, ED - Abnormal; Notable for the following:    Glucose-Capillary 120 (*)    All other components within normal limits  CBC    EKG  EKG Interpretation  Date/Time:  Sunday May 23 2016 15:23:29 EDT Ventricular Rate:  84 PR Interval:    QRS Duration: 72 QT Interval:  359 QTC Calculation: 425 R Axis:   36 Text Interpretation:  Sinus rhythm Low voltage, precordial leads Artifact Borderline ECG Confirmed by Carmin Muskrat  MD ([id number]) on 05/23/2016 4:31:07 PM       Radiology Dg Chest 2 View  Result Date: 05/23/2016 CLINICAL DATA:  Numbness, history of tobacco use EXAM: CHEST  2 VIEW COMPARISON:  04/25/2016 FINDINGS: Cardiac shadow is stable. Aortic calcifications are again seen stable. The lungs are well aerated bilaterally. Some chronic scarring is noted in the bases  bilaterally. No focal infiltrate or sizable effusion is seen. No bony abnormality is noted. IMPRESSION: No active cardiopulmonary disease. Electronically Signed   By: Inez Catalina M.D.   On: 05/23/2016 16:06   Ct Head Wo Contrast  Result Date: 05/23/2016 CLINICAL DATA:  Numbness and headaches, initial encounter EXAM: CT HEAD WITHOUT CONTRAST TECHNIQUE: Contiguous axial images were obtained from the base of the skull through the vertex without intravenous contrast. COMPARISON:  10/26/2013 FINDINGS: Brain: No evidence of acute infarction, hemorrhage, hydrocephalus, extra-axial collection or mass lesion/mass effect. Basal ganglia calcifications are seen bilaterally. Vascular: No hyperdense vessel or unexpected calcification. Skull: Normal. Negative for fracture or focal lesion. Sinuses/Orbits: No acute finding. Other: None IMPRESSION: No acute abnormality noted. Electronically Signed   By: Inez Catalina M.D.   On: 05/23/2016 16:05    Procedures Procedures (including critical care time)  Medications Ordered in ED Medications  sodium chloride 0.9 % bolus 500 mL (not administered)  ketorolac (TORADOL) 30 MG/ML injection 9.9 mg (not administered)     Initial Impression / Assessment and Plan / ED Course  I have reviewed the triage vital signs and the nursing notes.  Pertinent labs & imaging results that were available during my care of the patient were reviewed by me and considered in my medical decision making (see chart for details).  Clinical Course    5:02 PM Patient in no distress, calm, states that she feels slightly better after provision of medication here. Patient will follow up with both the [facility], and with one of our local vascular specialists.   Final Clinical Impressions(s) / ED Diagnoses  Patient presents with ongoing generalized complaints, as well as headache, left-sided. Patient is notable history of fibromyalgia, as well as recent diagnosis of stenosis in  the left carotid artery. Here, no evidence for focal neurologic deficits. No evidence for hemorrhage, mass, stroke. No evidence for ongoing infection. Patient does require additional outpatient management of her stenosis, but is medically stable for this to occur as an outpatient. Patient provided additional resources to facilitate treatment, discharged in  stable condition.   Carmin Muskrat, MD 05/23/16 [phone]

## 2016-05-23 NOTE — Discharge Instructions (Signed)
As discussed, your evaluation today has been largely reassuring.  But, it is important that you monitor your condition carefully, and do not hesitate to return to the ED if you develop new, or concerning changes in your condition. ? ?Otherwise, please follow-up with your physician for appropriate ongoing care. ? ?

## 2016-06-11 LAB — BASIC METABOLIC PANEL: Glucose: 103 mg/dL

## 2016-07-02 ENCOUNTER — Emergency Department (HOSPITAL_COMMUNITY)
Admission: EM | Admit: 2016-07-02 | Discharge: 2016-07-02 | Disposition: A | Discharge status: Home / Self Care | Payer: No Typology Code available for payment source | Attending: Emergency Medicine | Admitting: Emergency Medicine

## 2016-07-02 ENCOUNTER — Encounter (HOSPITAL_COMMUNITY): Payer: Self-pay | Admitting: Emergency Medicine

## 2016-07-02 ENCOUNTER — Emergency Department (HOSPITAL_COMMUNITY): Payer: No Typology Code available for payment source

## 2016-07-02 DIAGNOSIS — M79662 Pain in left lower leg: Secondary | ICD-10-CM | POA: Insufficient documentation

## 2016-07-02 DIAGNOSIS — I2510 Atherosclerotic heart disease of native coronary artery without angina pectoris: Secondary | ICD-10-CM | POA: Insufficient documentation

## 2016-07-02 DIAGNOSIS — F17210 Nicotine dependence, cigarettes, uncomplicated: Secondary | ICD-10-CM | POA: Insufficient documentation

## 2016-07-02 DIAGNOSIS — R0789 Other chest pain: Secondary | ICD-10-CM | POA: Insufficient documentation

## 2016-07-02 DIAGNOSIS — Z7982 Long term (current) use of aspirin: Secondary | ICD-10-CM | POA: Insufficient documentation

## 2016-07-02 DIAGNOSIS — I10 Essential (primary) hypertension: Secondary | ICD-10-CM | POA: Insufficient documentation

## 2016-07-02 DIAGNOSIS — R071 Chest pain on breathing: Secondary | ICD-10-CM

## 2016-07-02 DIAGNOSIS — Z79899 Other long term (current) drug therapy: Secondary | ICD-10-CM | POA: Insufficient documentation

## 2016-07-02 LAB — BASIC METABOLIC PANEL
ANION GAP: 6 (ref 5–15)
BUN: 5 mg/dL — AB (ref 6–20)
CALCIUM: 8.3 mg/dL — AB (ref 8.9–10.3)
CO2: 24 mmol/L (ref 22–32)
Chloride: 106 mmol/L (ref 101–111)
Creatinine, Ser: 0.64 mg/dL (ref 0.44–1.00)
GFR calc Af Amer: >60 mL/min (ref >60)
Glucose, Bld: 100 mg/dL — ABNORMAL HIGH (ref 65–99)
POTASSIUM: 3.8 mmol/L (ref 3.5–5.1)
SODIUM: 136 mmol/L (ref 135–145)

## 2016-07-02 LAB — CBC WITH DIFFERENTIAL/PLATELET
BASOS ABS: 0 10*3/uL (ref 0.0–0.1)
Basophils Relative: 0 %
EOS ABS: 0.2 10*3/uL (ref 0.0–0.7)
EOS PCT: 2 %
HCT: 39 % (ref 36.0–46.0)
Hemoglobin: 13 g/dL (ref 12.0–15.0)
Lymphocytes Relative: 26 %
Lymphs Abs: 1.8 10*3/uL (ref 0.7–4.0)
MCH: 30.7 pg (ref 26.0–34.0)
MCHC: 33.3 g/dL (ref 30.0–36.0)
MCV: 92 fL (ref 78.0–100.0)
Monocytes Absolute: 0.6 10*3/uL (ref 0.1–1.0)
Monocytes Relative: 9 %
Neutro Abs: 4.2 10*3/uL (ref 1.7–7.7)
Neutrophils Relative %: 63 %
PLATELETS: 192 10*3/uL (ref 150–400)
RBC: 4.24 MIL/uL (ref 3.87–5.11)
RDW: 13.5 % (ref 11.5–15.5)
WBC: 6.8 10*3/uL (ref 4.0–10.5)

## 2016-07-02 LAB — TROPONIN I: Troponin I: <0.03 ng/mL

## 2016-07-02 MED ORDER — FENTANYL CITRATE (PF) 100 MCG/2ML IJ SOLN
50.0000 ug | Freq: Once | INTRAMUSCULAR | Status: AC
  Administered 2016-07-02: 50 ug via INTRAVENOUS
  Filled 2016-07-02: qty 2

## 2016-07-02 MED ORDER — MORPHINE SULFATE (PF) 4 MG/ML IV SOLN
4.0000 mg | Freq: Once | INTRAVENOUS | Status: AC
  Administered 2016-07-02: 4 mg via INTRAVENOUS
  Filled 2016-07-02: qty 1

## 2016-07-02 MED ORDER — SODIUM CHLORIDE 0.9 % IV BOLUS (SEPSIS)
500.0000 mL | Freq: Once | INTRAVENOUS | Status: AC
  Administered 2016-07-02: 500 mL via INTRAVENOUS

## 2016-07-02 MED ORDER — ONDANSETRON HCL 4 MG/2ML IJ SOLN
4.0000 mg | Freq: Once | INTRAMUSCULAR | Status: AC
  Administered 2016-07-02: 4 mg via INTRAVENOUS
  Filled 2016-07-02: qty 2

## 2016-07-02 NOTE — ED Provider Notes (Signed)
[facility] DEPT Provider Note   CSN: [id number] Arrival date & time: 07/02/16  1436     History   Chief Complaint Chief Complaint  Patient presents with  . Chest Pain    HPI Dawn Mckay is a 65 y.o. female.  Chest pain under left breast described as pressure since 1 PM today with associated dyspnea. No diaphoresis or nausea. She smokes cigarettes. Status post MI in 2000. ROS positive for left calf tenderness. Pain is sensitive with any activity.       Past Medical History:  Diagnosis Date  . CAD (coronary artery disease)   . Chronic chest pain   . Chronic headache   . GERD (gastroesophageal reflux disease)   . History of breast cancer   . History of stress test 07/2013   normal  . History of stress test 2003   normal adenosine cardiolite study  . Hypertension     Patient Active Problem List   Diagnosis Date Noted  . HLD (hyperlipidemia) 02/22/2016  . Chronic pain syndrome 02/22/2016  . CAP (community acquired pneumonia) 02/21/2016  . Essential hypertension   . CAD (coronary artery disease), native coronary artery 06/22/2014  . Syncope 10/26/2013  . 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 UTI (lower urinary tract infection) 10/26/2013  . Migraine headache 10/26/2013  . Chest pain 08/04/2013  . Hypertension 08/04/2013  . Hyponatremia 08/04/2013  . Hypokalemia 08/04/2013  . Tobacco use disorder 08/04/2013    Past Surgical History:  Procedure Laterality Date  . BRAIN SURGERY    . BREAST SURGERY     due to Cancer  . CESAREAN SECTION    . EYE SURGERY    . THROAT SURGERY      OB History    No data available       Home Medications    Prior to Admission medications   Medication Sig Start Date End Date Taking? Authorizing Provider  albuterol (PROVENTIL HFA;VENTOLIN HFA) 108 (90 Base) MCG/ACT inhaler Inhale 2 puffs into the lungs every 6 (six) hours as needed for wheezing or shortness of breath. 02/22/16  Yes Kathie Dike, MD  amLODipine (NORVASC) 10  MG tablet Take 10 mg by mouth daily.   Yes Historical Provider, MD  aspirin EC 325 MG tablet Take 325 mg by mouth daily.   Yes Historical Provider, MD  cholecalciferol (VITAMIN D) 1000 units tablet Take 1 tablet by mouth daily.   Yes Historical Provider, MD  docusate sodium (COLACE) 100 MG capsule Take 200 mg by mouth daily.   Yes Historical Provider, MD  guaiFENesin (MUCINEX) 600 MG 12 hr tablet Take 1 tablet (600 mg total) by mouth 2 (two) times daily. 02/22/16  Yes Kathie Dike, MD  linaclotide (LINZESS) 290 MCG CAPS capsule Take 1 capsule by mouth daily.   Yes Historical Provider, MD  nitroGLYCERIN (NITROSTAT) 0.4 MG SL tablet Place 0.4 mg under the tongue every 5 (five) minutes as needed for chest pain.   Yes Historical Provider, MD  polyvinyl alcohol (LIQUIFILM TEARS) 1.4 % ophthalmic solution Place 1 drop into both eyes daily.    Yes Historical Provider, MD  potassium chloride (K-DUR,KLOR-CON) 10 MEQ tablet Take 10 mEq by mouth daily.   Yes Historical Provider, MD  pravastatin (PRAVACHOL) 40 MG tablet Take 40 mg by mouth daily.   Yes Historical Provider, MD  doxycycline (VIBRAMYCIN) 100 MG capsule Take 1 capsule (100 mg total) by mouth 2 (two) times daily. One po bid x 7 days Patient not taking: Reported on 05/23/2016  04/25/16   [name], MD  morphine (MSIR) 30 MG tablet Take 1 tablet (30 mg total) by mouth every 4 (four) hours as needed for severe pain. 04/25/16   [name], MD  predniSONE (DELTASONE) 20 MG tablet 2 tabs po daily x 4 days Patient not taking: Reported on 07/02/2016 04/25/16   [name], MD    Family History No family history on file.  Social History Social History  Substance Use Topics  . Smoking status: Current Every Day Smoker    Packs/day: 0.25    Types: Cigarettes  . Smokeless tobacco: Never Used  . Alcohol use No     Allergies   Patient has no known allergies.   Review of Systems Review of Systems  All other systems reviewed and are  negative.    Physical Exam Updated Vital Signs BP 144/86 (BP Location: Right Arm)   Pulse 70   Temp 98.1 F (36.7 C)   Resp 18   Ht 5\' 3"  (1.6 m)   Wt 160 lb (72.6 kg)   SpO2 94%   BMI 28.34 kg/m   Physical Exam  Constitutional: She is oriented to person, place, and time. She appears well-developed and well-nourished.  HENT:  Head: Normocephalic and atraumatic.  Eyes: Conjunctivae are normal.  Neck: Neck supple.  Cardiovascular: Normal rate and regular rhythm.   Pulmonary/Chest:  Tender to palpation under left breast  Abdominal: Soft. Bowel sounds are normal.  Musculoskeletal: Normal range of motion.  Neurological: She is alert and oriented to person, place, and time.  Skin: Skin is warm and dry.  Psychiatric: She has a normal mood and affect. Her behavior is normal.  Nursing note and vitals reviewed.    ED Treatments / Results  Labs (all labs ordered are listed, but only abnormal results are displayed) Labs Reviewed  BASIC METABOLIC PANEL - Abnormal; Notable for the following:       Result Value   Glucose, Bld 100 (*)    BUN 5 (*)    Calcium 8.3 (*)    All other components within normal limits  CBC WITH DIFFERENTIAL/PLATELET  TROPONIN I  I-STAT TROPOININ, ED    EKG  EKG Interpretation  Date/Time:  Friday July 02 2016 14:51:44 EST Ventricular Rate:  82 PR Interval:    QRS Duration: 92 QT Interval:  375 QTC Calculation: 438 R Axis:   10 Text Interpretation:  Sinus rhythm Low voltage, precordial leads New [id number] No STEMI Abnormal ECG Confirmed by Sherry Ruffing MD, CHRISTOPHER [phone]) on 07/02/2016 2:56:39 PM       Radiology Dg Chest 2 View  Result Date: 07/02/2016 CLINICAL DATA:  Chest pain, shortness of breath, history of breast carcinoma, smoking history EXAM: CHEST  2 VIEW COMPARISON:  Chest x-ray of 05/23/2016 FINDINGS: No active infiltrate or effusion is seen. Mediastinal and hilar contours are unremarkable. The heart is within normal limits in  size. No bony abnormality is seen. IMPRESSION: No active cardiopulmonary disease. Electronically Signed   By: Ivar Drape M.D.   On: 07/02/2016 16:33   US Venous Img Lower Unilateral Left  Result Date: 07/02/2016 CLINICAL DATA:  Calf pain and short of breath today EXAM: LEFT LOWER EXTREMITY VENOUS DUPLEX ULTRASOUND TECHNIQUE: Doppler venous assessment of the left lower extremity deep venous system was performed, including characterization of spectral flow, compressibility, and phasicity. COMPARISON:  None. FINDINGS: There is complete compressibility of the left common femoral, femoral, and popliteal veins. Doppler analysis demonstrates respiratory phasicity and augmentation of flow with  calf compression. No obvious superficial vein or calf vein thrombosis. IMPRESSION: No evidence of left lower extremity DVT. Electronically Signed   By: Marybelle Killings M.D.   On: 07/02/2016 16:30    Procedures Procedures (including critical care time)  Medications Ordered in ED Medications  sodium chloride 0.9 % bolus 500 mL (0 mLs Intravenous Stopped 07/02/16 1729)  morphine 4 MG/ML injection 4 mg (4 mg Intravenous Given 07/02/16 1603)  ondansetron (ZOFRAN) injection 4 mg (4 mg Intravenous Given 07/02/16 1603)  fentaNYL (SUBLIMAZE) injection 50 mcg (50 mcg Intravenous Given 07/02/16 1845)     Initial Impression / Assessment and Plan / ED Course  I have reviewed the triage vital signs and the nursing notes.  Pertinent labs & imaging results that were available during my care of the patient were reviewed by me and considered in my medical decision making (see chart for details).  Clinical Course     Patient is in no acute distress. Screening tests including troponin times 2, EKG, ultrasound of left lower extremity all negative acute findings. Pain is improved at discharge  Final Clinical Impressions(s) / ED Diagnoses   Final diagnoses:  Chest pain on breathing    New Prescriptions Discharge Medication  List as of 07/02/2016  9:38 PM       Nat Christen, MD 07/02/16 2148

## 2016-07-02 NOTE — ED Triage Notes (Signed)
Per EMS-pt c/o cp/sob today and lle pain. Pt reports she has history of a blood clot.
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
## 2016-07-02 NOTE — Discharge Instructions (Signed)
Tests showed no evidence of a heart attack. The ultrasound of your leg revealed no blood clot. Tylenol or ibuprofen for pain. Follow-up your primary care doctor.

## 2016-07-02 NOTE — ED Notes (Signed)
Reports pain is better, awaiting physician reevaluation

## 2016-07-02 NOTE — ED Notes (Signed)
I stat troponin resulted 0.00 Dr Lacinda Axon apprised

## 2016-07-03 LAB — I-STAT TROPONIN, ED: Troponin i, poc: 0 ng/mL (ref 0.00–0.08)

## 2016-08-13 ENCOUNTER — Encounter (HOSPITAL_COMMUNITY): Payer: Self-pay | Admitting: Emergency Medicine

## 2016-08-13 ENCOUNTER — Emergency Department (HOSPITAL_COMMUNITY)
Admission: EM | Admit: 2016-08-13 | Discharge: 2016-08-14 | Disposition: A | Discharge status: Home / Self Care | Payer: Non-veteran care | Attending: Emergency Medicine | Admitting: Emergency Medicine

## 2016-08-13 DIAGNOSIS — F17210 Nicotine dependence, cigarettes, uncomplicated: Secondary | ICD-10-CM | POA: Insufficient documentation

## 2016-08-13 DIAGNOSIS — R079 Chest pain, unspecified: Secondary | ICD-10-CM

## 2016-08-13 DIAGNOSIS — I10 Essential (primary) hypertension: Secondary | ICD-10-CM | POA: Insufficient documentation

## 2016-08-13 DIAGNOSIS — Z853 Personal history of malignant neoplasm of breast: Secondary | ICD-10-CM | POA: Insufficient documentation

## 2016-08-13 DIAGNOSIS — R51 Headache: Secondary | ICD-10-CM | POA: Diagnosis not present

## 2016-08-13 DIAGNOSIS — N390 Urinary tract infection, site not specified: Secondary | ICD-10-CM

## 2016-08-13 DIAGNOSIS — Z7982 Long term (current) use of aspirin: Secondary | ICD-10-CM | POA: Diagnosis not present

## 2016-08-13 DIAGNOSIS — R519 Headache, unspecified: Secondary | ICD-10-CM

## 2016-08-13 DIAGNOSIS — I2510 Atherosclerotic heart disease of native coronary artery without angina pectoris: Secondary | ICD-10-CM | POA: Diagnosis not present

## 2016-08-13 DIAGNOSIS — R0789 Other chest pain: Secondary | ICD-10-CM | POA: Diagnosis not present

## 2016-08-13 DIAGNOSIS — R109 Unspecified abdominal pain: Secondary | ICD-10-CM | POA: Diagnosis present

## 2016-08-13 NOTE — ED Triage Notes (Signed)
Pt presents from home with headache, L side pain which is chronic, and back pain; pt states she is concerned she is "having a stroke like my mother did"; pt does not have a facial droop, denies weakness, no slurred speech noted; A&Ox4; VSS per EMS report

## 2016-08-14 ENCOUNTER — Emergency Department (HOSPITAL_COMMUNITY): Payer: Non-veteran care

## 2016-08-14 LAB — CBC WITH DIFFERENTIAL/PLATELET
Basophils Absolute: 0 10*3/uL (ref 0.0–0.1)
Basophils Relative: 0 %
EOS PCT: 1 %
Eosinophils Absolute: 0.1 10*3/uL (ref 0.0–0.7)
HCT: 39.6 % (ref 36.0–46.0)
Hemoglobin: 13.7 g/dL (ref 12.0–15.0)
LYMPHS ABS: 1.9 10*3/uL (ref 0.7–4.0)
LYMPHS PCT: 22 %
MCH: 30.9 pg (ref 26.0–34.0)
MCHC: 34.6 g/dL (ref 30.0–36.0)
MCV: 89.4 fL (ref 78.0–100.0)
MONO ABS: 0.5 10*3/uL (ref 0.1–1.0)
Monocytes Relative: 6 %
Neutro Abs: 6 10*3/uL (ref 1.7–7.7)
Neutrophils Relative %: 71 %
PLATELETS: 228 10*3/uL (ref 150–400)
RBC: 4.43 MIL/uL (ref 3.87–5.11)
RDW: 13.6 % (ref 11.5–15.5)
WBC: 8.5 10*3/uL (ref 4.0–10.5)

## 2016-08-14 LAB — URINALYSIS, ROUTINE W REFLEX MICROSCOPIC
Bilirubin Urine: NEGATIVE
GLUCOSE, UA: NEGATIVE mg/dL
Ketones, ur: NEGATIVE mg/dL
Nitrite: POSITIVE — AB
PH: 7 (ref 5.0–8.0)
PROTEIN: NEGATIVE mg/dL
Specific Gravity, Urine: 1.002 — ABNORMAL LOW (ref 1.005–1.030)

## 2016-08-14 LAB — COMPREHENSIVE METABOLIC PANEL
ALT: 10 U/L — ABNORMAL LOW (ref 14–54)
ANION GAP: 11 (ref 5–15)
AST: 14 U/L — ABNORMAL LOW (ref 15–41)
Albumin: 3.7 g/dL (ref 3.5–5.0)
Alkaline Phosphatase: 53 U/L (ref 38–126)
BILIRUBIN TOTAL: 0.6 mg/dL (ref 0.3–1.2)
BUN: 5 mg/dL — AB (ref 6–20)
CALCIUM: 9.5 mg/dL (ref 8.9–10.3)
CO2: 22 mmol/L (ref 22–32)
Chloride: 102 mmol/L (ref 101–111)
Creatinine, Ser: 0.71 mg/dL (ref 0.44–1.00)
GFR calc Af Amer: >60 mL/min (ref >60)
Glucose, Bld: 108 mg/dL — ABNORMAL HIGH (ref 65–99)
POTASSIUM: 3.6 mmol/L (ref 3.5–5.1)
Sodium: 135 mmol/L (ref 135–145)
TOTAL PROTEIN: 6.4 g/dL — AB (ref 6.5–8.1)

## 2016-08-14 LAB — TROPONIN I: Troponin I: <0.03 ng/mL (ref <0.03)

## 2016-08-14 MED ORDER — CEPHALEXIN 500 MG PO CAPS: 500.0000 mg | ORAL_CAPSULE | Freq: Four times a day (QID) | ORAL | 0 refills | Status: DC

## 2016-08-14 MED ORDER — DEXTROSE 5 % IV SOLN
1.0000 g | Freq: Once | INTRAVENOUS | Status: AC
  Administered 2016-08-14: 1 g via INTRAVENOUS
  Filled 2016-08-14: qty 10

## 2016-08-14 MED ORDER — DIPHENHYDRAMINE HCL 50 MG/ML IJ SOLN
12.5000 mg | Freq: Once | INTRAMUSCULAR | Status: AC
  Administered 2016-08-14: 12.5 mg via INTRAVENOUS
  Filled 2016-08-14: qty 1

## 2016-08-14 MED ORDER — METOCLOPRAMIDE HCL 5 MG/ML IJ SOLN
10.0000 mg | Freq: Once | INTRAMUSCULAR | Status: AC
  Administered 2016-08-14: 10 mg via INTRAVENOUS
  Filled 2016-08-14: qty 2

## 2016-08-14 MED ORDER — SODIUM CHLORIDE 0.9 % IV BOLUS (SEPSIS)
500.0000 mL | Freq: Once | INTRAVENOUS | Status: AC
  Administered 2016-08-14: 500 mL via INTRAVENOUS

## 2016-08-14 MED ORDER — DEXAMETHASONE SODIUM PHOSPHATE 10 MG/ML IJ SOLN
10.0000 mg | Freq: Once | INTRAMUSCULAR | Status: AC
Start: 2016-08-14 — End: 2016-08-14
  Administered 2016-08-14: 10 mg via INTRAVENOUS
  Filled 2016-08-14: qty 1

## 2016-08-14 NOTE — Discharge Instructions (Signed)
See your doctor or return here if symptoms worsen. Take medications as prescribed.

## 2016-08-14 NOTE — ED Provider Notes (Signed)
[facility] DEPT Provider Note   CSN: [id number] Arrival date & time: 08/13/16  2347     History   Chief Complaint Chief Complaint  Patient presents with  . Back Pain  . Headache  . Flank Pain    left, chronic    HPI Dawn Mckay is a 65 y.o. female.  Patient presents with multiple complaints. She has a headache since this morning located in the left side of her head. She has a history of headaches, "migraines", and reports current headache is different. No visual changes or photophobia. No alleviating or aggravating factors. She also has pain from the epigastric area around under left breast to lateral chest wall. This pain "is there all the time" and is chronic in nature. No changes. She also reports she feels she has another urinary tract infection with dysuria and low back pain. No nausea or vomiting. No fever. No generalized or lateralizing weakness. No numbness, slurred speech, confusion, difficulty walking.    The history is provided by the patient. No language interpreter was used.  Back Pain   Associated symptoms include chest pain (See HPi.), headaches and dysuria. Pertinent negatives include no fever and no weakness.  Headache   Pertinent negatives include no fever, no shortness of breath, no nausea and no vomiting.  Flank Pain  Associated symptoms include chest pain (See HPi.) and headaches. Pertinent negatives include no shortness of breath.    Past Medical History:  Diagnosis Date  . CAD (coronary artery disease)   . Chronic chest pain   . Chronic headache   . GERD (gastroesophageal reflux disease)   . History of breast cancer   . History of stress test 07/2013   normal  . History of stress test 2003   normal adenosine cardiolite study  . Hypertension     Patient Active Problem List   Diagnosis Date Noted  . HLD (hyperlipidemia) 02/22/2016  . Chronic pain syndrome 02/22/2016  . CAP (community acquired pneumonia) 02/21/2016  . Essential  hypertension   . CAD (coronary artery disease), native coronary artery 06/22/2014  . Syncope 10/26/2013  . 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 UTI (lower urinary tract infection) 10/26/2013  . Migraine headache 10/26/2013  . Chest pain 08/04/2013  . Hypertension 08/04/2013  . Hyponatremia 08/04/2013  . Hypokalemia 08/04/2013  . Tobacco use disorder 08/04/2013    Past Surgical History:  Procedure Laterality Date  . BRAIN SURGERY    . BREAST SURGERY     due to Cancer  . CESAREAN SECTION    . EYE SURGERY    . THROAT SURGERY      OB History    No data available       Home Medications    Prior to Admission medications   Medication Sig Start Date End Date Taking? Authorizing Provider  albuterol (PROVENTIL HFA;VENTOLIN HFA) 108 (90 Base) MCG/ACT inhaler Inhale 2 puffs into the lungs every 6 (six) hours as needed for wheezing or shortness of breath. 02/22/16   Kathie Dike, MD  amLODipine (NORVASC) 10 MG tablet Take 10 mg by mouth daily.    Historical Provider, MD  aspirin EC 325 MG tablet Take 325 mg by mouth daily.    Historical Provider, MD  cholecalciferol (VITAMIN D) 1000 units tablet Take 1 tablet by mouth daily.    Historical Provider, MD  docusate sodium (COLACE) 100 MG capsule Take 200 mg by mouth daily.    Historical Provider, MD  doxycycline (VIBRAMYCIN) 100 MG capsule Take 1 capsule (  100 mg total) by mouth 2 (two) times daily. One po bid x 7 days Patient not taking: Reported on 05/23/2016 04/25/16   [name], MD  guaiFENesin (MUCINEX) 600 MG 12 hr tablet Take 1 tablet (600 mg total) by mouth 2 (two) times daily. 02/22/16   Kathie Dike, MD  linaclotide (LINZESS) 290 MCG CAPS capsule Take 1 capsule by mouth daily.    Historical Provider, MD  morphine (MSIR) 30 MG tablet Take 1 tablet (30 mg total) by mouth every 4 (four) hours as needed for severe pain. 04/25/16   [name], MD  nitroGLYCERIN (NITROSTAT) 0.4 MG SL tablet Place 0.4 mg under the tongue every 5 (five) minutes  as needed for chest pain.    Historical Provider, MD  polyvinyl alcohol (LIQUIFILM TEARS) 1.4 % ophthalmic solution Place 1 drop into both eyes daily.     Historical Provider, MD  potassium chloride (K-DUR,KLOR-CON) 10 MEQ tablet Take 10 mEq by mouth daily.    Historical Provider, MD  pravastatin (PRAVACHOL) 40 MG tablet Take 40 mg by mouth daily.    Historical Provider, MD  predniSONE (DELTASONE) 20 MG tablet 2 tabs po daily x 4 days Patient not taking: Reported on 07/02/2016 04/25/16   [name], MD    Family History No family history on file.  Social History Social History  Substance Use Topics  . Smoking status: Current Every Day Smoker    Packs/day: 0.25    Types: Cigarettes  . Smokeless tobacco: Never Used  . Alcohol use No     Allergies   Patient has no known allergies.   Review of Systems Review of Systems  Constitutional: Negative for chills and fever.  HENT: Negative.   Respiratory: Negative.  Negative for shortness of breath.   Cardiovascular: Positive for chest pain (See HPi.).  Gastrointestinal: Negative.  Negative for nausea and vomiting.  Genitourinary: Positive for dysuria and flank pain.  Musculoskeletal: Positive for back pain.  Skin: Negative.   Neurological: Positive for headaches. Negative for speech difficulty and weakness.     Physical Exam Updated Vital Signs BP 151/66 (BP Location: Right Arm)   Pulse 83   Temp 97.8 F (36.6 C) (Oral)   Resp 18   Ht 5\' 3"  (1.6 m)   Wt 72.6 kg   SpO2 96% Comment: Simultaneous filing. User may not have seen previous data.  BMI 28.34 kg/m   Physical Exam  Constitutional: She is oriented to person, place, and time. She appears well-developed and well-nourished. No distress.  HENT:  Head: Normocephalic and atraumatic.  Eyes: Conjunctivae are normal.  Neck: Normal range of motion. Neck supple.  Cardiovascular: Normal rate and regular rhythm.   Pulmonary/Chest: Effort normal and breath sounds normal. She  has no wheezes. She has no rales.  Abdominal: Soft. Bowel sounds are normal. There is tenderness (Epigastric tenderness. ). There is no rebound and no guarding.  Musculoskeletal: Normal range of motion.  Neurological: She is alert and oriented to person, place, and time.  CN's 3-12 grossly intact. No facial asymmetry or speech difficulty. No deficits of coordination.   Skin: Skin is warm and dry. No rash noted.  Psychiatric: She has a normal mood and affect.     ED Treatments / Results  Labs (all labs ordered are listed, but only abnormal results are displayed) Labs Reviewed  COMPREHENSIVE METABOLIC PANEL - Abnormal; Notable for the following:       Result Value   Glucose, Bld 108 (*)  BUN 5 (*)    Total Protein 6.4 (*)    AST 14 (*)    ALT 10 (*)    All other components within normal limits  URINALYSIS, ROUTINE W REFLEX MICROSCOPIC - Abnormal; Notable for the following:    Specific Gravity, Urine 1.002 (*)    Hgb urine dipstick SMALL (*)    Nitrite POSITIVE (*)    Leukocytes, UA LARGE (*)    Bacteria, UA RARE (*)    Squamous Epithelial / LPF 0-5 (*)    All other components within normal limits  CBC WITH DIFFERENTIAL/PLATELET  TROPONIN I    EKG  EKG Interpretation  Date/Time:  Saturday August 14 2016 00:42:43 EST Ventricular Rate:  77 PR Interval:    QRS Duration: 82 QT Interval:  373 QTC Calculation: 423 R Axis:   15 Text Interpretation:  Sinus rhythm Low voltage, precordial leads No significant change was found Confirmed by Wyvonnia Dusky  MD, STEPHEN [phone]) on 08/14/2016 1:43:08 AM       Radiology Dg Chest 2 View  Result Date: 08/14/2016 CLINICAL DATA:  Chest pain radiating to the back EXAM: CHEST  2 VIEW COMPARISON:  Chest radiograph 07/02/2016 FINDINGS: There is atherosclerotic calcification in the aortic arch. Cardiomediastinal contours are otherwise normal. There is bibasilar atelectasis without focal consolidation or pulmonary edema. No pleural effusion or  pneumothorax. IMPRESSION: No focal airspace disease.  Aortic atherosclerosis. Electronically Signed   By: Ulyses Jarred M.D.   On: 08/14/2016 01:19    Procedures Procedures (including critical care time)  Medications Ordered in ED Medications  metoCLOPramide (REGLAN) injection 10 mg (10 mg Intravenous Given 08/14/16 0126)  diphenhydrAMINE (BENADRYL) injection 12.5 mg (12.5 mg Intravenous Given 08/14/16 0126)  dexamethasone (DECADRON) injection 10 mg (10 mg Intravenous Given 08/14/16 0125)     Initial Impression / Assessment and Plan / ED Course  I have reviewed the triage vital signs and the nursing notes.  Pertinent labs & imaging results that were available during my care of the patient were reviewed by me and considered in my medical decision making (see chart for details).  Clinical Course     Patient presents with headache, left chest pain (chronic, unchanged), dysuria for the past 2 days. No fever, vomiting.   Headache is improved with headache cocktail. No neurologic deficits. Stable. Left sided chest pain, chronic, stable. She does have evidence of a UTI and has been given Rocephin in the ED. Discussed plan of discharge with the patient who is agreeable to going home. No unaddressed concerns.   Final Clinical Impressions(s) / ED Diagnoses   Final diagnoses:  None   1. UTI 2. Nonspecific headache 3. Chronic left chest pain  New Prescriptions New Prescriptions   No medications on file     Charlann Lange, Hershal Coria [id number] [phone]    Delora Fuel, MD [id number]

## 2017-04-19 ENCOUNTER — Emergency Department (HOSPITAL_COMMUNITY): Payer: Non-veteran care

## 2017-04-19 ENCOUNTER — Observation Stay (HOSPITAL_COMMUNITY)
Admission: EM | Admit: 2017-04-19 | Discharge: 2017-04-20 | Discharge status: Against Medical Advice | Payer: Non-veteran care | Attending: Family Medicine | Admitting: Family Medicine

## 2017-04-19 ENCOUNTER — Encounter (HOSPITAL_COMMUNITY): Payer: Self-pay | Admitting: Emergency Medicine

## 2017-04-19 DIAGNOSIS — G459 Transient cerebral ischemic attack, unspecified: Secondary | ICD-10-CM | POA: Diagnosis present

## 2017-04-19 DIAGNOSIS — R0789 Other chest pain: Secondary | ICD-10-CM | POA: Insufficient documentation

## 2017-04-19 DIAGNOSIS — Z8542 Personal history of malignant neoplasm of other parts of uterus: Secondary | ICD-10-CM | POA: Insufficient documentation

## 2017-04-19 DIAGNOSIS — R202 Paresthesia of skin: Principal | ICD-10-CM | POA: Insufficient documentation

## 2017-04-19 DIAGNOSIS — I25110 Atherosclerotic heart disease of native coronary artery with unstable angina pectoris: Secondary | ICD-10-CM

## 2017-04-19 DIAGNOSIS — Z79899 Other long term (current) drug therapy: Secondary | ICD-10-CM | POA: Diagnosis not present

## 2017-04-19 DIAGNOSIS — I10 Essential (primary) hypertension: Secondary | ICD-10-CM | POA: Diagnosis not present

## 2017-04-19 DIAGNOSIS — Z853 Personal history of malignant neoplasm of breast: Secondary | ICD-10-CM | POA: Insufficient documentation

## 2017-04-19 DIAGNOSIS — R079 Chest pain, unspecified: Secondary | ICD-10-CM | POA: Diagnosis not present

## 2017-04-19 DIAGNOSIS — R200 Anesthesia of skin: Secondary | ICD-10-CM | POA: Diagnosis not present

## 2017-04-19 DIAGNOSIS — F17210 Nicotine dependence, cigarettes, uncomplicated: Secondary | ICD-10-CM | POA: Diagnosis not present

## 2017-04-19 DIAGNOSIS — R51 Headache: Secondary | ICD-10-CM | POA: Insufficient documentation

## 2017-04-19 DIAGNOSIS — Z9013 Acquired absence of bilateral breasts and nipples: Secondary | ICD-10-CM | POA: Insufficient documentation

## 2017-04-19 DIAGNOSIS — Z7982 Long term (current) use of aspirin: Secondary | ICD-10-CM | POA: Diagnosis not present

## 2017-04-19 DIAGNOSIS — R110 Nausea: Secondary | ICD-10-CM | POA: Insufficient documentation

## 2017-04-19 DIAGNOSIS — R531 Weakness: Secondary | ICD-10-CM | POA: Diagnosis not present

## 2017-04-19 DIAGNOSIS — E782 Mixed hyperlipidemia: Secondary | ICD-10-CM | POA: Diagnosis present

## 2017-04-19 DIAGNOSIS — E785 Hyperlipidemia, unspecified: Secondary | ICD-10-CM | POA: Diagnosis not present

## 2017-04-19 DIAGNOSIS — E876 Hypokalemia: Secondary | ICD-10-CM | POA: Insufficient documentation

## 2017-04-19 DIAGNOSIS — I2510 Atherosclerotic heart disease of native coronary artery without angina pectoris: Secondary | ICD-10-CM | POA: Diagnosis present

## 2017-04-19 LAB — URINALYSIS, ROUTINE W REFLEX MICROSCOPIC
BACTERIA UA: NONE SEEN
BILIRUBIN URINE: NEGATIVE
Glucose, UA: NEGATIVE mg/dL
KETONES UR: NEGATIVE mg/dL
LEUKOCYTES UA: NEGATIVE
NITRITE: NEGATIVE
PH: 7 (ref 5.0–8.0)
Protein, ur: NEGATIVE mg/dL
SPECIFIC GRAVITY, URINE: 1.001 — AB (ref 1.005–1.030)
Squamous Epithelial / LPF: NONE SEEN

## 2017-04-19 LAB — BASIC METABOLIC PANEL WITH GFR
Anion gap: 8 (ref 5–15)
BUN: 10 mg/dL (ref 6–20)
CO2: 23 mmol/L (ref 22–32)
Calcium: 9 mg/dL (ref 8.9–10.3)
Chloride: 100 mmol/L — ABNORMAL LOW (ref 101–111)
Creatinine, Ser: 0.82 mg/dL (ref 0.44–1.00)
GFR calc Af Amer: >60 mL/min
GFR calc non Af Amer: >60 mL/min
Glucose, Bld: 135 mg/dL — ABNORMAL HIGH (ref 65–99)
Potassium: 3.3 mmol/L — ABNORMAL LOW (ref 3.5–5.1)
Sodium: 131 mmol/L — ABNORMAL LOW (ref 135–145)

## 2017-04-19 LAB — RAPID URINE DRUG SCREEN, HOSP PERFORMED
Amphetamines: NOT DETECTED
Barbiturates: NOT DETECTED
Benzodiazepines: NOT DETECTED
Cocaine: NOT DETECTED
OPIATES: NOT DETECTED
TETRAHYDROCANNABINOL: NOT DETECTED

## 2017-04-19 LAB — CBC
HCT: 42.3 % (ref 36.0–46.0)
HEMATOCRIT: 42.2 % (ref 36.0–46.0)
Hemoglobin: 14.4 g/dL (ref 12.0–15.0)
Hemoglobin: 14.6 g/dL (ref 12.0–15.0)
MCH: 30.7 pg (ref 26.0–34.0)
MCH: 31.3 pg (ref 26.0–34.0)
MCHC: 34 g/dL (ref 30.0–36.0)
MCHC: 34.6 g/dL (ref 30.0–36.0)
MCV: 90.2 fL (ref 78.0–100.0)
MCV: 90.4 fL (ref 78.0–100.0)
PLATELETS: 259 10*3/uL (ref 150–400)
Platelets: 252 10*3/uL (ref 150–400)
RBC: 4.69 MIL/uL (ref 3.87–5.11)
RBC: 4.67 MIL/uL (ref 3.87–5.11)
RDW: 13.3 % (ref 11.5–15.5)
RDW: 13.4 % (ref 11.5–15.5)
WBC: 11.6 10*3/uL — ABNORMAL HIGH (ref 4.0–10.5)
WBC: 11.4 10*3/uL — ABNORMAL HIGH (ref 4.0–10.5)

## 2017-04-19 LAB — DIFFERENTIAL
BASOS PCT: 0 %
Basophils Absolute: 0 10*3/uL (ref 0.0–0.1)
EOS PCT: 1 %
Eosinophils Absolute: 0.1 10*3/uL (ref 0.0–0.7)
Lymphocytes Relative: 20 %
Lymphs Abs: 2.3 10*3/uL (ref 0.7–4.0)
MONO ABS: 0.8 10*3/uL (ref 0.1–1.0)
MONOS PCT: 7 %
Neutro Abs: 8.3 10*3/uL — ABNORMAL HIGH (ref 1.7–7.7)
Neutrophils Relative %: 72 %

## 2017-04-19 LAB — HEPATIC FUNCTION PANEL
ALBUMIN: 4.3 g/dL (ref 3.5–5.0)
ALK PHOS: 53 U/L (ref 38–126)
ALT: 9 U/L — ABNORMAL LOW (ref 14–54)
AST: 20 U/L (ref 15–41)
BILIRUBIN INDIRECT: 0.5 mg/dL (ref 0.3–0.9)
BILIRUBIN TOTAL: 0.7 mg/dL (ref 0.3–1.2)
Bilirubin, Direct: 0.2 mg/dL (ref 0.1–0.5)
Total Protein: 7.4 g/dL (ref 6.5–8.1)

## 2017-04-19 LAB — APTT: aPTT: 30 seconds (ref 24–36)

## 2017-04-19 LAB — TROPONIN I: Troponin I: <0.03 ng/mL

## 2017-04-19 LAB — PROTIME-INR
INR: 0.96
PROTHROMBIN TIME: 12.7 s (ref 11.4–15.2)

## 2017-04-19 LAB — ETHANOL

## 2017-04-19 MED ORDER — GI COCKTAIL ~~LOC~~: 30.0000 mL | Freq: Once | ORAL | Status: DC

## 2017-04-19 MED ORDER — POTASSIUM CHLORIDE CRYS ER 20 MEQ PO TBCR
40.0000 meq | EXTENDED_RELEASE_TABLET | Freq: Once | ORAL | Status: AC
  Administered 2017-04-20: 40 meq via ORAL
  Filled 2017-04-19: qty 2

## 2017-04-19 NOTE — ED Triage Notes (Signed)
Onset 30 mins ago while sitting at the table, left side chest pain, tingling in face, elevated BP

## 2017-04-19 NOTE — Progress Notes (Signed)
CODE STROKE  BEEPER 2025 STARTED 2033 FINISHED/SENT TO [facility] 2036 COMPLETED/CALLED GR 2038

## 2017-04-19 NOTE — ED Provider Notes (Signed)
[facility] DEPT Provider Note   CSN: [id number] Arrival date & time: 04/19/17  1851     History   Chief Complaint Chief Complaint  Patient presents with  . Chest Pain    HPI Dawn Mckay is a 66 y.o. female.  HPI  66 year old female history of coronary artery disease, smoker, history of breast cancer presents today with a sudden onset of left facial paresthesias and hypertension. States that she was sitting at her table at 6 PM when she felt some numbness in her left side of face. She is also stating that she has a headache. She also complains of some pain that began in the left lateral abdomen and chest. She denies any vision changes, neck pain, shortness of breath, nausea, vomiting, or diarrhea. She denies any difficulty speaking. She has some tremors that appear worse in the left arm that she states she has had these before. She states that she had difficulty walking but that is secondary to some leg weakness that has been chronic.  Past Medical History:  Diagnosis Date  . CAD (coronary artery disease)   . Chronic chest pain   . Chronic headache   . GERD (gastroesophageal reflux disease)   . History of breast cancer   . History of stress test 07/2013   normal  . History of stress test 2003   normal adenosine cardiolite study  . Hypertension     Patient Active Problem List   Diagnosis Date Noted  . HLD (hyperlipidemia) 02/22/2016  . Chronic pain syndrome 02/22/2016  . CAP (community acquired pneumonia) 02/21/2016  . Essential hypertension   . CAD (coronary artery disease), native coronary artery 06/22/2014  . Syncope 10/26/2013  . 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 UTI (lower urinary tract infection) 10/26/2013  . Migraine headache 10/26/2013  . Chest pain 08/04/2013  . Hypertension 08/04/2013  . Hyponatremia 08/04/2013  . Hypokalemia 08/04/2013  . Tobacco use disorder 08/04/2013    Past Surgical History:  Procedure Laterality Date  . BRAIN SURGERY    .  BREAST SURGERY     due to Cancer  . CESAREAN SECTION    . EYE SURGERY    . THROAT SURGERY      OB History    No data available       Home Medications    Prior to Admission medications   Medication Sig Start Date End Date Taking? Authorizing Provider  albuterol (PROVENTIL HFA;VENTOLIN HFA) 108 (90 Base) MCG/ACT inhaler Inhale 2 puffs into the lungs every 6 (six) hours as needed for wheezing or shortness of breath. 02/22/16  Yes Kathie Dike, MD  amLODipine (NORVASC) 10 MG tablet Take 10 mg by mouth daily.   Yes [provider]  aspirin EC 325 MG tablet Take 325 mg by mouth daily.   Yes [provider]  Cyanocobalamin (B-12 PO) Take 1 tablet by mouth daily.   Yes [provider]  docusate sodium (COLACE) 100 MG capsule Take 200 mg by mouth daily.   Yes [provider]  linaclotide (LINZESS) 290 MCG CAPS capsule Take 1 capsule by mouth daily.   Yes [provider]  nitroGLYCERIN (NITROSTAT) 0.4 MG SL tablet Place 0.4 mg under the tongue every 5 (five) minutes as needed for chest pain.   Yes [provider]  potassium chloride (K-DUR,KLOR-CON) 10 MEQ tablet Take 10 mEq by mouth daily.   Yes [provider]  pravastatin (PRAVACHOL) 40 MG tablet Take 40 mg by mouth daily.   Yes  [provider]    Family History No family history on file.  Social History Social History  Substance Use Topics  . Smoking status: Current Every Day Smoker    Packs/day: 0.25    Types: Cigarettes  . Smokeless tobacco: Never Used  . Alcohol use No     Allergies   Amitriptyline and Gabapentin   Review of Systems Review of Systems  All other systems reviewed and are negative.    Physical Exam Updated Vital Signs BP (!) 175/97 (BP Location: Right Arm)   Pulse (!) 113   Temp 97.8 F (36.6 C) (Oral)   Resp 20   Ht 1.6 m (5\' 3" )   Wt 72.6 kg (160 lb)   SpO2 96%   BMI 28.34 kg/m   Physical Exam  Constitutional: She is  oriented to person, place, and time. She appears well-developed and well-nourished. No distress.  HENT:  Head: Normocephalic and atraumatic.  Right Ear: External ear normal.  Left Ear: External ear normal.  Nose: Nose normal.  Eyes: Pupils are equal, round, and reactive to light. Conjunctivae and EOM are normal.  Neck: Normal range of motion. Neck supple.  Cardiovascular: Normal rate, regular rhythm and normal heart sounds.   Pulmonary/Chest: Effort normal and breath sounds normal.  Abdominal: Soft. Bowel sounds are normal.  Musculoskeletal: Normal range of motion.  Neurological: She is alert and oriented to person, place, and time. She exhibits normal muscle tone. Coordination normal.  Left face sensory deficit Cn normal No palmar drift Left leg weaker against gravity relative to right dtr equal bilateral patella, and brachial  Skin: Skin is warm and dry.  Psychiatric: She has a normal mood and affect. Her behavior is normal. Thought content normal.  Nursing note and vitals reviewed.    ED Treatments / Results  Labs (all labs ordered are listed, but only abnormal results are displayed) Labs Reviewed  CBC - Abnormal; Notable for the following:       Result Value   WBC 11.6 (*)    All other components within normal limits  BASIC METABOLIC PANEL  TROPONIN I    EKG  EKG Interpretation  Date/Time:  Tuesday April 19 2017 18:56:30 EDT Ventricular Rate:  106 PR Interval:  140 QRS Duration: 60 QT Interval:  336 QTC Calculation: 446 R Axis:   4 Text Interpretation:  Sinus tachycardia with Premature ventricular complexes or Fusion complexes Otherwise normal ECG Confirmed by Pattricia Boss [phone]) on 04/19/2017 8:17:59 PM       Radiology Dg Chest 2 View  Result Date: 04/19/2017 CLINICAL DATA:  Left chest pain EXAM: CHEST  2 VIEW COMPARISON:  August 14, 2016 FINDINGS: The heart size and mediastinal contours are within normal limits. Mild linear atelectasis of both lung  bases are noted. There is no focal pneumonia pulmonary edema or pleural effusion. The visualized skeletal structures are unremarkable. IMPRESSION: Mild linear atelectasis of bilateral lung bases are noted. No focal pneumonia is noted. Electronically Signed   By: Abelardo Diesel M.D.   On: 04/19/2017 19:19    Procedures Procedures (including critical care time)  Medications Ordered in ED Medications - No data to display   Initial Impression / Assessment and Plan / ED Course  I have reviewed the triage vital signs and the nursing notes.  Pertinent labs & imaging results that were available during my care of the patient were reviewed by me and considered in my medical decision making (see chart for details).    Discussed  with neurologist on for specialists on call and given low stroke scale would not do tpa at this time.  Patient continues with facial paresthesia, tremor with mild decrease in strength lue vs rue.  Plan admission for tia evaluation.   Discussed with Dr. Darrick Meigs and he will see Final Clinical Impressions(s) / ED Diagnoses   Final diagnoses:  Transient cerebral ischemia, unspecified type  Hypertension, unspecified type  Nonspecific chest pain    New Prescriptions New Prescriptions   No medications on file     Pattricia Boss, MD 04/19/17 2143

## 2017-04-19 NOTE — H&P (Signed)
TRH H&P    Patient Demographics:    Dawn Mckay, is a 66 y.o. female  MRN: [id number]  DOB - [birth year]  Admit Date - 04/19/2017  Referring MD/NP/PA: Dr. Jeanell Sparrow  Outpatient Primary MD for the patient is Patient, No Pcp Per  Patient coming from: Home  Chief Complaint  Patient presents with  . Chest Pain      HPI:    Dawn Mckay  is a 66 y.o. female, With history of coronary artery disease, tobacco abuse, breast cancer status post bilateral mastectomy, uterine cancer, chronic back pain came to hospital and patient started having numbness of left side of face. She also had headache. Patient says that when she checks blood pressure was elevated to 257/119. She also had some numbness of left upper arm and left lower extremity.  Patient came to hospital code stroke was called. CT head was negative for stroke. She was evaluated by tele neurology, and no TPA was not recommended given as risk was associated more than any benefit due to no disabling deficits on exam.  She, also complains of chest pain, she does have history of GERD. Denies fever or dysuria. Complains of nausea. No shortness of breath. No seizure disorder. No visual changes. No slurred speech.   Review of systems:     All other systems reviewed and are negative.   With Past History of the following :    Past Medical History:  Diagnosis Date  . CAD (coronary artery disease)   . Chronic chest pain   . Chronic headache   . GERD (gastroesophageal reflux disease)   . History of breast cancer   . History of stress test 07/2013   normal  . History of stress test 2003   normal adenosine cardiolite study  . Hypertension       Past Surgical History:  Procedure Laterality Date  . BRAIN SURGERY    . BREAST SURGERY     due to Cancer  . CESAREAN SECTION    . EYE SURGERY    . THROAT SURGERY        Social History:      Social  History  Substance Use Topics  . Smoking status: Current Every Day Smoker    Packs/day: 0.25    Types: Cigarettes  . Smokeless tobacco: Never Used  . Alcohol use No       Family History :   Patient's mother had kidney cancer, father had colon cancer.   Home Medications:   Prior to Admission medications   Medication Sig Start Date End Date Taking? Authorizing Provider  albuterol (PROVENTIL HFA;VENTOLIN HFA) 108 (90 Base) MCG/ACT inhaler Inhale 2 puffs into the lungs every 6 (six) hours as needed for wheezing or shortness of breath. 02/22/16  Yes Kathie Dike, MD  amLODipine (NORVASC) 10 MG tablet Take 10 mg by mouth daily.   Yes [provider]  aspirin EC 325 MG tablet Take 325 mg by mouth daily.   Yes [provider]  Cyanocobalamin (B-12 PO) Take 1 tablet by mouth  daily.   Yes [provider]  docusate sodium (COLACE) 100 MG capsule Take 200 mg by mouth daily.   Yes [provider]  linaclotide (LINZESS) 290 MCG CAPS capsule Take 1 capsule by mouth daily.   Yes [provider]  nitroGLYCERIN (NITROSTAT) 0.4 MG SL tablet Place 0.4 mg under the tongue every 5 (five) minutes as needed for chest pain.   Yes [provider]  potassium chloride (K-DUR,KLOR-CON) 10 MEQ tablet Take 10 mEq by mouth daily.   Yes [provider]  pravastatin (PRAVACHOL) 40 MG tablet Take 40 mg by mouth daily.   Yes [provider]     Allergies:     Allergies  Allergen Reactions  . Amitriptyline Anaphylaxis  . Gabapentin Swelling     Physical Exam:   Vitals  Blood pressure 122/66, pulse 65, temperature 97.8 F (36.6 C), temperature source Oral, resp. rate (!) 22, height 5\' 3"  (1.6 m), weight 72.6 kg (160 lb), SpO2 93 %.  1.  General: Appears in no acute distress  2. Psychiatric:  Intact judgement and  insight, awake alert, oriented x 3.  3. Neurologic: No focal neurological deficits, all cranial nerves intact.Strength  5/5 all 4 extremities, sensation reduced on left side of face intact all 4 extremities, plantars down going. No pronator drift.  4. Eyes :  anicteric sclerae, moist conjunctivae with no lid lag. PERRLA.  5. ENMT:  Oropharynx clear with moist mucous membranes and good dentition  6. Neck:  supple, no cervical lymphadenopathy appriciated, No thyromegaly  7. Respiratory : Normal respiratory effort, good air movement bilaterally,clear to  auscultation bilaterally  8. Cardiovascular : RRR, no gallops, rubs or murmurs, no leg edema  9. Gastrointestinal:  Positive bowel sounds, abdomen soft, non-tender to palpation,no hepatosplenomegaly, no rigidity or guarding       10. Skin:  No cyanosis, normal texture and turgor, no rash, lesions or ulcers  11.Musculoskeletal:  Good muscle tone,  joints appear normal , no effusions,  normal range of motion    Data Review:    CBC  Recent Labs Lab 04/19/17 1953  WBC 11.4*  11.6*  HGB 14.6  14.4  HCT 42.2  42.3  PLT 259  252  MCV 90.4  90.2  MCH 31.3  30.7  MCHC 34.6  34.0  RDW 13.4  13.3  LYMPHSABS 2.3  MONOABS 0.8  EOSABS 0.1  BASOSABS 0.0   ------------------------------------------------------------------------------------------------------------------  Chemistries   Recent Labs Lab 04/19/17 1953  NA 131*  K 3.3*  CL 100*  CO2 23  GLUCOSE 135*  BUN 10  CREATININE 0.82  CALCIUM 9.0  AST 20  ALT 9*  ALKPHOS 53  BILITOT 0.7   ------------------------------------------------------------------------------------------------------------------  ------------------------------------------------------------------------------------------------------------------ GFR: Estimated Creatinine Clearance: 66.2 mL/min (by C-G formula based on SCr of 0.82 mg/dL). Liver Function Tests:  Recent Labs Lab 04/19/17 1953  AST 20  ALT 9*  ALKPHOS 53  BILITOT 0.7  PROT 7.4  ALBUMIN 4.3   No results for input(s): LIPASE,  AMYLASE in the last 168 hours. No results for input(s): AMMONIA in the last 168 hours. Coagulation Profile:  Recent Labs Lab 04/19/17 1953  INR 0.96   Cardiac Enzymes:  Recent Labs Lab 04/19/17 1953  TROPONINI <0.03    --------------------------------------------------------------------------------------------------------------- Urine analysis:    Component Value Date/Time   COLORURINE STRAW (A) 04/19/2017 2140   APPEARANCEUR CLEAR 04/19/2017 2140   LABSPEC 1.001 (L) 04/19/2017 2140   PHURINE 7.0 04/19/2017 2140   GLUCOSEU NEGATIVE  04/19/2017 2140   HGBUR SMALL (A) 04/19/2017 2140   BILIRUBINUR NEGATIVE 04/19/2017 2140   KETONESUR NEGATIVE 04/19/2017 2140   PROTEINUR NEGATIVE 04/19/2017 2140   UROBILINOGEN 0.2 12/15/2014 1418   NITRITE NEGATIVE 04/19/2017 2140   LEUKOCYTESUR NEGATIVE 04/19/2017 2140      Imaging Results:    Dg Chest 2 View  Result Date: 04/19/2017 CLINICAL DATA:  Left chest pain EXAM: CHEST  2 VIEW COMPARISON:  August 14, 2016 FINDINGS: The heart size and mediastinal contours are within normal limits. Mild linear atelectasis of both lung bases are noted. There is no focal pneumonia pulmonary edema or pleural effusion. The visualized skeletal structures are unremarkable. IMPRESSION: Mild linear atelectasis of bilateral lung bases are noted. No focal pneumonia is noted. Electronically Signed   By: Abelardo Diesel M.D.   On: 04/19/2017 19:19   Ct Head Code Stroke Wo Contrast  Result Date: 04/19/2017 CLINICAL DATA:  Code stroke. 66 year old female with symptom onset 1800 hours. Facial weakness, hypertensive. EXAM: CT HEAD WITHOUT CONTRAST TECHNIQUE: Contiguous axial images were obtained from the base of the skull through the vertex without intravenous contrast. COMPARISON:  05/23/2016 head CT, 10/26/2013 brain MRI. FINDINGS: Brain: Cerebral volume remains normal. No ventriculomegaly. No midline shift, mass effect, or evidence of intracranial mass lesion. No  acute intracranial hemorrhage identified. Gray-white matter differentiation is stable and within normal limits throughout the brain. No cortically based acute infarct identified. Vascular: Calcified atherosclerosis at the skull base. No suspicious intracranial vascular hyperdensity. Skull: No acute osseous abnormality identified. 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 Sinuses/Orbits: Visualized paranasal sinuses and mastoids are stable and well pneumatized. Other: No acute orbit or scalp soft tissue findings. ASPECTS ([place] Stroke Program Early CT Score) - Ganglionic level infarction (caudate, lentiform nuclei, internal capsule, insula, M1-M3 cortex): 7 - Supraganglionic infarction (M4-M6 cortex): 3 Total score (0-10 with 10 being normal): 10 IMPRESSION: 1. Stable and normal noncontrast CT appearance of the brain. 2. ASPECTS is 10. 3. Study discussed by telephone with Dr. Andee Poles RAY on 04/19/2017 at 20:44 . Electronically Signed   By: Genevie Ann M.D.   On: 04/19/2017 20:44    My personal review of EKG: Rhythm NSR   Assessment & Plan:    Active Problems:   Hypertension   Hypokalemia   CAD (coronary artery disease), native coronary artery   HLD (hyperlipidemia)   TIA (transient ischemic attack)   1. TIA versus stroke-we'll place under observation, obtain MRI/MRA brain, bilateral carotid duplex, echocardiogram. Continue aspirin 325 mg by mouth daily. Check lipid profile, hemoglobin A1c in a.m. 2. Hypokalemia- potassium is 3.3, will give K-Dur 40 mg by mouth 1. Check BMP in a.m. 3. Chest pain-appears atypical she does have history of GERD. Will give 1 dose of GI cocktail. Also check troponin every 6 hours 3. EKG shows normal sinus rhythm. 4. Hyperlipidemia-continue Pravachol. 5. Hypertension-blood pressure is stable at this time, will hold antihypertensive medications for permissive hypertension, treated only for BP more than 220/120.   DVT Prophylaxis-   Lovenox    AM Labs Ordered, also please review Full Orders  Family Communication: Admission, patients condition and plan of care including tests being ordered have been discussed with the patient and her son at bedside* who indicate understanding and agree with the plan and Code Status.  Code Status:  Full code  Admission status: Observation    Time spent in minutes : 60 minutes   LAMA,GAGAN S M.D on 04/19/2017 at 11:30 PM  Between 7am to 7pm - Pager - [phone]. After 7pm go to [email or web address] - password [facility]  [facility] - Office  [phone]

## 2017-04-20 LAB — HEMOGLOBIN A1C
HEMOGLOBIN A1C: 5.4 % (ref 4.8–5.6)
MEAN PLASMA GLUCOSE: 108.28 mg/dL

## 2017-04-20 LAB — TROPONIN I

## 2017-04-20 MED ORDER — PRAVASTATIN SODIUM 40 MG PO TABS: 40.0000 mg | ORAL_TABLET | Freq: Every day | ORAL | Status: DC

## 2017-04-20 MED ORDER — STROKE: EARLY STAGES OF RECOVERY BOOK
Freq: Once | Status: DC
  Filled 2017-04-20: qty 1

## 2017-04-20 MED ORDER — SENNOSIDES-DOCUSATE SODIUM 8.6-50 MG PO TABS: 1.0000 | ORAL_TABLET | Freq: Every evening | ORAL | Status: DC | PRN

## 2017-04-20 MED ORDER — ALBUTEROL SULFATE HFA 108 (90 BASE) MCG/ACT IN AERS: 2.0000 | INHALATION_SPRAY | Freq: Four times a day (QID) | RESPIRATORY_TRACT | Status: DC | PRN

## 2017-04-20 MED ORDER — HYDROCODONE-ACETAMINOPHEN 5-325 MG PO TABS
1.0000 | ORAL_TABLET | Freq: Four times a day (QID) | ORAL | Status: DC | PRN
  Filled 2017-04-20: qty 1

## 2017-04-20 MED ORDER — ACETAMINOPHEN 325 MG PO TABS: 650.0000 mg | ORAL_TABLET | ORAL | Status: DC | PRN

## 2017-04-20 MED ORDER — ACETAMINOPHEN 650 MG RE SUPP: 650.0000 mg | RECTAL | Status: DC | PRN

## 2017-04-20 MED ORDER — ACETAMINOPHEN 160 MG/5ML PO SOLN: 650.0000 mg | ORAL | Status: DC | PRN

## 2017-04-20 MED ORDER — FAMOTIDINE 20 MG PO TABS
ORAL_TABLET | ORAL | Status: AC
  Filled 2017-04-20: qty 1

## 2017-04-20 MED ORDER — FAMOTIDINE 20 MG PO TABS
20.0000 mg | ORAL_TABLET | Freq: Once | ORAL | Status: AC
  Administered 2017-04-20: 20 mg via ORAL

## 2017-04-20 MED ORDER — ASPIRIN 325 MG PO TABS: 325.0000 mg | ORAL_TABLET | Freq: Every day | ORAL | Status: DC

## 2017-04-20 MED ORDER — SODIUM CHLORIDE 0.9 % IV SOLN: INTRAVENOUS | Status: DC

## 2017-04-20 MED ORDER — ENOXAPARIN SODIUM 40 MG/0.4ML ~~LOC~~ SOLN
40.0000 mg | SUBCUTANEOUS | Status: DC
  Administered 2017-04-20: 40 mg via SUBCUTANEOUS
  Filled 2017-04-20: qty 0.4

## 2017-04-20 MED ORDER — ASPIRIN 300 MG RE SUPP: 300.0000 mg | Freq: Every day | RECTAL | Status: DC

## 2017-04-20 NOTE — ED Notes (Signed)
Per teleneuro, pt did not meet TPA criteria.

## 2017-04-20 NOTE — ED Notes (Signed)
Upon entering pts room, pt sitting up on side of the bed stating she is ready to leave. Pt stating " I know how this works and you cant hold me here." Pt advised that if she goes home she could die. Pt verbalized understanding of her actions.

## 2017-04-20 NOTE — ED Notes (Signed)
Rn explained the risks of leaving ama and that the risk could include worsening of condition and even death, pt states " I am going to die anyway" pt refused to sign ama form, witnessed by [employer] as well,

## 2017-04-21 LAB — URINE CULTURE: CULTURE: NO GROWTH

## 2017-04-21 LAB — HIV ANTIBODY (ROUTINE TESTING W REFLEX): HIV SCREEN 4TH GENERATION: NONREACTIVE

## 2017-06-14 ENCOUNTER — Emergency Department (HOSPITAL_COMMUNITY)
Admission: EM | Admit: 2017-06-14 | Discharge: 2017-06-14 | Disposition: A | Discharge status: Home / Self Care | Payer: Non-veteran care | Attending: Emergency Medicine | Admitting: Emergency Medicine

## 2017-06-14 ENCOUNTER — Emergency Department (HOSPITAL_COMMUNITY): Payer: Non-veteran care

## 2017-06-14 ENCOUNTER — Encounter (HOSPITAL_COMMUNITY): Payer: Self-pay

## 2017-06-14 DIAGNOSIS — F17210 Nicotine dependence, cigarettes, uncomplicated: Secondary | ICD-10-CM | POA: Diagnosis not present

## 2017-06-14 DIAGNOSIS — I10 Essential (primary) hypertension: Secondary | ICD-10-CM | POA: Insufficient documentation

## 2017-06-14 DIAGNOSIS — Z853 Personal history of malignant neoplasm of breast: Secondary | ICD-10-CM | POA: Insufficient documentation

## 2017-06-14 DIAGNOSIS — Z7982 Long term (current) use of aspirin: Secondary | ICD-10-CM | POA: Diagnosis not present

## 2017-06-14 DIAGNOSIS — Z7902 Long term (current) use of antithrombotics/antiplatelets: Secondary | ICD-10-CM | POA: Insufficient documentation

## 2017-06-14 DIAGNOSIS — R079 Chest pain, unspecified: Secondary | ICD-10-CM | POA: Diagnosis present

## 2017-06-14 DIAGNOSIS — R002 Palpitations: Secondary | ICD-10-CM | POA: Diagnosis not present

## 2017-06-14 DIAGNOSIS — Z79899 Other long term (current) drug therapy: Secondary | ICD-10-CM | POA: Insufficient documentation

## 2017-06-14 DIAGNOSIS — I2510 Atherosclerotic heart disease of native coronary artery without angina pectoris: Secondary | ICD-10-CM | POA: Diagnosis not present

## 2017-06-14 LAB — BASIC METABOLIC PANEL
ANION GAP: 9 (ref 5–15)
BUN: <5 mg/dL — ABNORMAL LOW (ref 6–20)
CALCIUM: 9 mg/dL (ref 8.9–10.3)
CO2: 22 mmol/L (ref 22–32)
CREATININE: 0.78 mg/dL (ref 0.44–1.00)
Chloride: 104 mmol/L (ref 101–111)
Glucose, Bld: 107 mg/dL — ABNORMAL HIGH (ref 65–99)
Potassium: 3.7 mmol/L (ref 3.5–5.1)
SODIUM: 135 mmol/L (ref 135–145)

## 2017-06-14 LAB — CBC
HCT: 41 % (ref 36.0–46.0)
HEMOGLOBIN: 13.7 g/dL (ref 12.0–15.0)
MCH: 30.2 pg (ref 26.0–34.0)
MCHC: 33.4 g/dL (ref 30.0–36.0)
MCV: 90.3 fL (ref 78.0–100.0)
PLATELETS: 267 10*3/uL (ref 150–400)
RBC: 4.54 MIL/uL (ref 3.87–5.11)
RDW: 13.9 % (ref 11.5–15.5)
WBC: 9.4 10*3/uL (ref 4.0–10.5)

## 2017-06-14 LAB — I-STAT TROPONIN, ED: TROPONIN I, POC: 0 ng/mL (ref 0.00–0.08)

## 2017-06-14 MED ORDER — ONDANSETRON HCL 8 MG PO TABS: 8.0000 mg | ORAL_TABLET | Freq: Three times a day (TID) | ORAL | 0 refills | Status: DC | PRN

## 2017-06-14 MED ORDER — HYDROCODONE-ACETAMINOPHEN 5-325 MG PO TABS: 1.0000 | ORAL_TABLET | ORAL | 0 refills | Status: DC | PRN

## 2017-06-14 NOTE — ED Provider Notes (Signed)
[facility] EMERGENCY DEPARTMENT Provider Note   CSN: [id number] Arrival date & time: 06/14/17  1914     History   Chief Complaint Chief Complaint  Patient presents with  . Chest Pain    HPI Dawn Mckay is a 66 y.o. female.  She presents for evaluation of palpitation, with rapid heartbeat, measured at home over 200 and also elevated blood pressure also measured over 200 by her home monitoring device.  The patient took aspirin prior to leaving home.  She was transferred by EMS, who treated her with nitroglycerin for chest discomfort.  She describes her chest pain is mild.  She has had previous similar episodes of rapid heart beating.  Also her blood pressure tends to be variable between low 100s to about 200 at different times.  She denies fever, chills, cough, nausea, vomiting, weakness or dizziness.  She continues to smoke cigarettes.  There are no other known modifying factors.  HPI  Past Medical History:  Diagnosis Date  . CAD (coronary artery disease)   . Chronic chest pain   . Chronic headache   . GERD (gastroesophageal reflux disease)   . History of breast cancer   . History of stress test 07/2013   normal  . History of stress test 2003   normal adenosine cardiolite study  . Hypertension     Patient Active Problem List   Diagnosis Date Noted  . TIA (transient ischemic attack) 04/19/2017  . HLD (hyperlipidemia) 02/22/2016  . Chronic pain syndrome 02/22/2016  . CAP (community acquired pneumonia) 02/21/2016  . Essential hypertension   . CAD (coronary artery disease), native coronary artery 06/22/2014  . Syncope 10/26/2013  . 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 UTI (lower urinary tract infection) 10/26/2013  . Migraine headache 10/26/2013  . Chest pain 08/04/2013  . Hypertension 08/04/2013  . Hyponatremia 08/04/2013  . Hypokalemia 08/04/2013  . Tobacco use disorder 08/04/2013    Past Surgical History:  Procedure Laterality Date  . BRAIN  SURGERY    . BREAST SURGERY     due to Cancer  . CESAREAN SECTION    . EYE SURGERY    . THROAT SURGERY      OB History    No data available       Home Medications    Prior to Admission medications   Medication Sig Start Date End Date Taking? Authorizing Provider  albuterol (PROVENTIL HFA;VENTOLIN HFA) 108 (90 Base) MCG/ACT inhaler Inhale 2 puffs into the lungs every 6 (six) hours as needed for wheezing or shortness of breath. 02/22/16  Yes Kathie Dike, MD  amLODipine (NORVASC) 10 MG tablet Take 10 mg by mouth daily.   Yes [provider]  aspirin EC 325 MG tablet Take 325 mg by mouth daily.   Yes [provider]  Cyanocobalamin (B-12 PO) Take 1 tablet by mouth daily.   Yes [provider]  docusate sodium (COLACE) 100 MG capsule Take 200 mg by mouth as needed for mild constipation.    Yes [provider]  lidocaine (LIDODERM) 5 % Place 1 patch onto the skin daily as needed.   Yes [provider]  linaclotide (LINZESS) 290 MCG CAPS capsule Take 1 capsule by mouth daily.   Yes [provider]  nitroGLYCERIN (NITROSTAT) 0.4 MG SL tablet Place 0.4 mg under the tongue every 5 (five) minutes as needed for chest pain.   Yes [provider]  Polyvinyl Alcohol (LIQUID TEARS OP) Apply 4-6 drops to eye daily  as needed (dry eyes).   Yes [provider]  pravastatin (PRAVACHOL) 40 MG tablet Take 40 mg by mouth daily.   Yes [provider]  promethazine (PHENERGAN) 25 MG tablet Take 25 mg by mouth as needed. 04/14/17  Yes [provider]  HYDROcodone-acetaminophen (NORCO) 5-325 MG tablet Take 1 tablet by mouth every 4 (four) hours as needed. 06/14/17   Daleen Bo, MD  ondansetron (ZOFRAN) 8 MG tablet Take 1 tablet (8 mg total) by mouth every 8 (eight) hours as needed for nausea or vomiting. 06/14/17   Daleen Bo, MD    Family History History reviewed. No pertinent family history.  Social  History Social History  Substance Use Topics  . Smoking status: Current Every Day Smoker    Packs/day: 0.25    Types: Cigarettes  . Smokeless tobacco: Never Used  . Alcohol use No     Allergies   Amitriptyline; Gabapentin; Lyrica [pregabalin]; and Tramadol   Review of Systems Review of Systems  All other systems reviewed and are negative.    Physical Exam Updated Vital Signs BP (!) 109/58   Pulse 77   Temp 97.6 F (36.4 C) (Oral)   Resp 14   SpO2 99%   Physical Exam  Constitutional: She is oriented to person, place, and time. She appears well-developed.  She appears older than stated age.  HENT:  Head: Normocephalic and atraumatic.  Eyes: Pupils are equal, round, and reactive to light. Conjunctivae and EOM are normal.  Neck: Normal range of motion and phonation normal. Neck supple.  Cardiovascular: Normal rate and regular rhythm.   Pulmonary/Chest: Effort normal and breath sounds normal. No respiratory distress. She has no wheezes. She exhibits no tenderness.  Abdominal: Soft. She exhibits no distension. There is no tenderness. There is no guarding.  Musculoskeletal: Normal range of motion. She exhibits no edema, tenderness or deformity.  Neurological: She is alert and oriented to person, place, and time. She exhibits normal muscle tone.  Skin: Skin is warm and dry.  Psychiatric: She has a normal mood and affect. Her behavior is normal. Judgment and thought content normal.  Nursing note and vitals reviewed.    ED Treatments / Results  Labs (all labs ordered are listed, but only abnormal results are displayed) Labs Reviewed  BASIC METABOLIC PANEL - Abnormal; Notable for the following:       Result Value   Glucose, Bld 107 (*)    BUN <5 (*)    All other components within normal limits  CBC  I-STAT TROPONIN, ED    EKG  EKG Interpretation  Date/Time:  Tuesday June 14 2017 19:22:09 EDT Ventricular Rate:  82 PR Interval:  124 QRS Duration: 70 QT  Interval:  368 QTC Calculation: 429 R Axis:   25 Text Interpretation:  Normal sinus rhythm Normal ECG Since last tracing rate slower Confirmed by Daleen Bo [phone]) on 06/14/2017 7:36:40 PM       Radiology Dg Chest 2 View  Result Date: 06/14/2017 CLINICAL DATA:  Left chest pain. Hypertension and tachycardia today. EXAM: CHEST  2 VIEW COMPARISON:  04/19/2017. FINDINGS: Normal sized heart. Calcified and mildly tortuous thoracic aorta. Clear lungs with normal vascularity. Diffuse osteopenia. IMPRESSION: No acute abnormality. Electronically Signed   By: Claudie Revering M.D.   On: 06/14/2017 20:27    Procedures Procedures (including critical care time)  Medications Ordered in ED Medications - No data to display   Initial Impression / Assessment and Plan / ED Course  I  have reviewed the triage vital signs and the nursing notes.  Pertinent labs & imaging results that were available during my care of the patient were reviewed by me and considered in my medical decision making (see chart for details).      Patient Vitals for the past 24 hrs:  BP Temp Temp src Pulse Resp SpO2  06/14/17 2100 (!) 109/58 - - 77 14 99 %  06/14/17 1932 - - - - - 96 %  06/14/17 1917 (!) 117/57 97.6 F (36.4 C) Oral 82 20 96 %  06/14/17 1916 - - - - - 98 %    9:15 PM Reevaluation with update and discussion. After initial assessment and treatment, an updated evaluation reveals patient is comfortable now.  Vital signs are reassuring.  She now states that she thinks her fibromyalgia is causing chest, and back pain.  She denies shortness of breath at this time.  She requested medication for pain, and nausea.  Findings discussed with the patient and all questions were answered. Reatha Sur L      Final Clinical Impressions(s) / ED Diagnoses   Final diagnoses:  Chest pain, unspecified type  Palpitation   Nonspecific chest discomfort, possibly musculoskeletal related.  ED evaluation is reassuring.  No  recurrence of rapid heartbeat, here or during EMS transport.  Doubt ACS, PE or pneumonia.  Nursing Notes Reviewed/ Care Coordinated Applicable Imaging Reviewed Interpretation of Laboratory Data incorporated into ED treatment  The patient appears reasonably screened and/or stabilized for discharge and I doubt any other medical condition or other [facility] requiring further screening, evaluation, or treatment in the ED at this time prior to discharge.  Plan: Home Medications-continue current medications; Home Treatments-rest, fluids, stop smoking; return here if the recommended treatment, does not improve the symptoms; Recommended follow up-PCP, as needed.  Cardiology follow-up for evaluation of palpitations.   New Prescriptions New Prescriptions   HYDROCODONE-ACETAMINOPHEN (NORCO) 5-325 MG TABLET    Take 1 tablet by mouth every 4 (four) hours as needed.   ONDANSETRON (ZOFRAN) 8 MG TABLET    Take 1 tablet (8 mg total) by mouth every 8 (eight) hours as needed for nausea or vomiting.     Daleen Bo, MD 06/14/17 2116

## 2017-06-14 NOTE — ED Triage Notes (Signed)
Pt coming from home with [facility]. Pt c/o left sided chest that started about 3 hours ago. Pt states she was sitting watching tv when chest came on. Pt states the chest pain radiates to her left shoulder and down her left arm. Pt took 324 asa prior to ems arrival. Ems gave her 2 nitro and chest pain is still a 8/10. Ems states bp was 200/103 when they arrived after 2 nitro bp dropped to 112/70.

## 2017-06-14 NOTE — Discharge Instructions (Signed)
The testing today did not show damage to your heart.  It is important to follow-up with a cardiologist as soon as possible to be evaluated for the palpitations, and high blood pressure.  Call the doctors at the [facility] to schedule appointment as soon as possible with a cardiologist.

## 2017-06-14 NOTE — ED Notes (Signed)
PT states understanding of care given, follow up care. PT ambulated from ED to car with a steady gait.  

## 2017-10-02 ENCOUNTER — Encounter (HOSPITAL_COMMUNITY): Payer: Self-pay | Admitting: *Deleted

## 2017-10-02 ENCOUNTER — Emergency Department (HOSPITAL_COMMUNITY): Payer: Non-veteran care

## 2017-10-02 ENCOUNTER — Other Ambulatory Visit: Payer: Self-pay

## 2017-10-02 ENCOUNTER — Observation Stay (HOSPITAL_COMMUNITY)
Admission: EM | Admit: 2017-10-02 | Discharge: 2017-10-03 | Disposition: A | Discharge status: Home / Self Care | Payer: Non-veteran care | Attending: Internal Medicine | Admitting: Internal Medicine

## 2017-10-02 DIAGNOSIS — G894 Chronic pain syndrome: Secondary | ICD-10-CM | POA: Diagnosis present

## 2017-10-02 DIAGNOSIS — R079 Chest pain, unspecified: Secondary | ICD-10-CM | POA: Diagnosis present

## 2017-10-02 DIAGNOSIS — E785 Hyperlipidemia, unspecified: Secondary | ICD-10-CM | POA: Insufficient documentation

## 2017-10-02 DIAGNOSIS — Z7982 Long term (current) use of aspirin: Secondary | ICD-10-CM | POA: Insufficient documentation

## 2017-10-02 DIAGNOSIS — R072 Precordial pain: Principal | ICD-10-CM | POA: Insufficient documentation

## 2017-10-02 DIAGNOSIS — N644 Mastodynia: Secondary | ICD-10-CM | POA: Diagnosis present

## 2017-10-02 DIAGNOSIS — I10 Essential (primary) hypertension: Secondary | ICD-10-CM | POA: Insufficient documentation

## 2017-10-02 DIAGNOSIS — F17210 Nicotine dependence, cigarettes, uncomplicated: Secondary | ICD-10-CM | POA: Insufficient documentation

## 2017-10-02 DIAGNOSIS — M797 Fibromyalgia: Secondary | ICD-10-CM | POA: Insufficient documentation

## 2017-10-02 DIAGNOSIS — I2510 Atherosclerotic heart disease of native coronary artery without angina pectoris: Secondary | ICD-10-CM | POA: Diagnosis present

## 2017-10-02 DIAGNOSIS — Z79899 Other long term (current) drug therapy: Secondary | ICD-10-CM | POA: Insufficient documentation

## 2017-10-02 DIAGNOSIS — E782 Mixed hyperlipidemia: Secondary | ICD-10-CM | POA: Diagnosis present

## 2017-10-02 LAB — CBC
HCT: 40.2 % (ref 36.0–46.0)
Hemoglobin: 13.4 g/dL (ref 12.0–15.0)
MCH: 30.5 pg (ref 26.0–34.0)
MCHC: 33.3 g/dL (ref 30.0–36.0)
MCV: 91.4 fL (ref 78.0–100.0)
Platelets: 223 10*3/uL (ref 150–400)
RBC: 4.4 MIL/uL (ref 3.87–5.11)
RDW: 13.2 % (ref 11.5–15.5)
WBC: 6.7 10*3/uL (ref 4.0–10.5)

## 2017-10-02 LAB — BASIC METABOLIC PANEL
Anion gap: 11 (ref 5–15)
BUN: 7 mg/dL (ref 6–20)
CALCIUM: 9 mg/dL (ref 8.9–10.3)
CO2: 21 mmol/L — AB (ref 22–32)
CREATININE: 0.67 mg/dL (ref 0.44–1.00)
Chloride: 104 mmol/L (ref 101–111)
GFR calc Af Amer: >60 mL/min (ref >60)
GFR calc non Af Amer: >60 mL/min (ref >60)
GLUCOSE: 103 mg/dL — AB (ref 65–99)
Potassium: 3.8 mmol/L (ref 3.5–5.1)
Sodium: 136 mmol/L (ref 135–145)

## 2017-10-02 LAB — I-STAT TROPONIN, ED: TROPONIN I, POC: 0 ng/mL (ref 0.00–0.08)

## 2017-10-02 MED ORDER — IOPAMIDOL (ISOVUE-370) INJECTION 76%
100.0000 mL | Freq: Once | INTRAVENOUS | Status: AC | PRN
  Administered 2017-10-02: 100 mL via INTRAVENOUS

## 2017-10-02 MED ORDER — FENTANYL CITRATE (PF) 100 MCG/2ML IJ SOLN
25.0000 ug | Freq: Once | INTRAMUSCULAR | Status: AC
  Administered 2017-10-02: 25 ug via INTRAVENOUS
  Filled 2017-10-02: qty 2

## 2017-10-02 MED ORDER — ONDANSETRON HCL 4 MG/2ML IJ SOLN
4.0000 mg | Freq: Once | INTRAMUSCULAR | Status: AC
  Administered 2017-10-02: 4 mg via INTRAVENOUS
  Filled 2017-10-02: qty 2

## 2017-10-02 NOTE — ED Notes (Signed)
Pt off floor to X-ray at this time

## 2017-10-02 NOTE — ED Triage Notes (Signed)
Pt brought in by [facility] for c/o chest pain; pt c/o chest pain that started at 1815 while talking on the phone; pt states the pain is sharp and radiates under her left breast; pt took 2 nitroglycerin and 325mg  of ASA at home prior to ems arrival; pt's BP with ems was over 709 systolic; pt states the nitro did not help with the pain

## 2017-10-02 NOTE — ED Provider Notes (Addendum)
[facility] EMERGENCY DEPARTMENT Provider Note   CSN: [id number] Arrival date & time: 10/02/17  2019     History   Chief Complaint Chief Complaint  Patient presents with  . Chest Pain    HPI Dawn Mckay is a 67 y.o. female.  Patient with acute onset of chest pain left-sided radiating to the back left neck and shoulder area 1800 today.  Patient took 2 nitroglycerin and 325 mg of aspirin at home without any improvement.  Patient states associated with shortness of breath.  Nausea but no vomiting patient states that she was told in the past that she had a small heart attack.  No stents.  Patient had a history of a stress test in 2014 that was normal.  The pain has persisted.  Patient treated with fentanyl some improvement in pain but not resolved.  Patient also has a history of chronic pain.      Past Medical History:  Diagnosis Date  . CAD (coronary artery disease)   . Chronic chest pain   . Chronic headache   . GERD (gastroesophageal reflux disease)   . History of breast cancer   . History of stress test 07/2013   normal  . History of stress test 2003   normal adenosine cardiolite study  . Hypertension     Patient Active Problem List   Diagnosis Date Noted  . TIA (transient ischemic attack) 04/19/2017  . HLD (hyperlipidemia) 02/22/2016  . Chronic pain syndrome 02/22/2016  . CAP (community acquired pneumonia) 02/21/2016  . Essential hypertension   . CAD (coronary artery disease), native coronary artery 06/22/2014  . Syncope 10/26/2013  . 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 UTI (lower urinary tract infection) 10/26/2013  . Migraine headache 10/26/2013  . Chest pain 08/04/2013  . Hypertension 08/04/2013  . Hyponatremia 08/04/2013  . Hypokalemia 08/04/2013  . Tobacco use disorder 08/04/2013    Past Surgical History:  Procedure Laterality Date  . BRAIN SURGERY    . BREAST SURGERY     due to Cancer  . CESAREAN SECTION    . EYE SURGERY    . THROAT SURGERY       OB History    No data available       Home Medications    Prior to Admission medications   Medication Sig Start Date End Date Taking? Authorizing Provider  albuterol (PROVENTIL HFA;VENTOLIN HFA) 108 (90 Base) MCG/ACT inhaler Inhale 2 puffs into the lungs every 6 (six) hours as needed for wheezing or shortness of breath. 02/22/16   Kathie Dike, MD  amLODipine (NORVASC) 10 MG tablet Take 10 mg by mouth daily.    [provider]  aspirin EC 325 MG tablet Take 325 mg by mouth daily.    [provider]  Cyanocobalamin (B-12 PO) Take 1 tablet by mouth daily.    [provider]  docusate sodium (COLACE) 100 MG capsule Take 200 mg by mouth as needed for mild constipation.     [provider]  HYDROcodone-acetaminophen (NORCO) 5-325 MG tablet Take 1 tablet by mouth every 4 (four) hours as needed. 06/14/17   Daleen Bo, MD  lidocaine (LIDODERM) 5 % Place 1 patch onto the skin daily as needed.    [provider]  linaclotide (LINZESS) 290 MCG CAPS capsule Take 1 capsule by mouth daily.    [provider]  nitroGLYCERIN (NITROSTAT) 0.4 MG SL tablet Place 0.4 mg under the tongue every 5 (five) minutes as needed for chest pain.  [provider]  ondansetron (ZOFRAN) 8 MG tablet Take 1 tablet (8 mg total) by mouth every 8 (eight) hours as needed for nausea or vomiting. 06/14/17   Daleen Bo, MD  Polyvinyl Alcohol (LIQUID TEARS OP) Apply 4-6 drops to eye daily as needed (dry eyes).    [provider]  pravastatin (PRAVACHOL) 40 MG tablet Take 40 mg by mouth daily.    [provider]  promethazine (PHENERGAN) 25 MG tablet Take 25 mg by mouth as needed. 04/14/17   [provider]    Family History History reviewed. No pertinent family history.  Social History Social History   Tobacco Use  . Smoking status: Current Every Day Smoker    Packs/day: 0.25    Types: Cigarettes  . Smokeless tobacco:  Never Used  Substance Use Topics  . Alcohol use: No  . Drug use: No     Allergies   Amitriptyline; Gabapentin; Lyrica [pregabalin]; and Tramadol   Review of Systems Review of Systems  Constitutional: Negative for fever.  HENT: Negative for congestion.   Eyes: Negative for redness.  Respiratory: Positive for shortness of breath. Negative for wheezing.   Cardiovascular: Positive for chest pain.  Gastrointestinal: Positive for nausea. Negative for abdominal pain and vomiting.  Genitourinary: Negative for dysuria.  Musculoskeletal: Positive for back pain and neck pain.  Skin: Negative for rash.  Neurological: Negative for syncope and headaches.  Hematological: Does not bruise/bleed easily.  Psychiatric/Behavioral: Negative for confusion.     Physical Exam Updated Vital Signs BP (!) 97/45   Pulse 70   Temp 97.7 F (36.5 C) (Oral)   Resp 14   Ht 1.6 m (5\' 3" )   Wt 72.6 kg (160 lb)   SpO2 98%   BMI 28.34 kg/m   Physical Exam  Constitutional: She is oriented to person, place, and time. She appears well-developed and well-nourished. No distress.  HENT:  Head: Normocephalic and atraumatic.  Mouth/Throat: Oropharynx is clear and moist.  Eyes: Conjunctivae and EOM are normal. Pupils are equal, round, and reactive to light.  Neck: Normal range of motion. Neck supple.  Cardiovascular: Normal rate and regular rhythm.  Pulmonary/Chest: Effort normal and breath sounds normal. No respiratory distress.  Abdominal: Soft. Bowel sounds are normal. There is no tenderness.  Musculoskeletal: She exhibits no edema.  Neurological: She is alert and oriented to person, place, and time. No cranial nerve deficit or sensory deficit. She exhibits normal muscle tone. Coordination normal.  Skin: Skin is warm.  Nursing note and vitals reviewed.    ED Treatments / Results  Labs (all labs ordered are listed, but only abnormal results are displayed) Labs Reviewed  BASIC METABOLIC PANEL -  Abnormal; Notable for the following components:      Result Value   CO2 21 (*)    Glucose, Bld 103 (*)    All other components within normal limits  CBC  I-STAT TROPONIN, ED    EKG  EKG Interpretation  Date/Time:  Sunday October 02 2017 20:23:43 EST Ventricular Rate:  84 PR Interval:    QRS Duration: 94 QT Interval:  366 QTC Calculation: 433 R Axis:   42 Text Interpretation:  Sinus rhythm Low voltage, precordial leads Abnormal R-wave progression, early transition Interpretation limited secondary to artifact Confirmed by Fredia Sorrow [phone]) on 10/02/2017 8:29:39 PM       Radiology Dg Chest 2 View  Result Date: 10/02/2017 CLINICAL DATA:  Chest pain EXAM: CHEST  2 VIEW COMPARISON:  June 14, 2017  FINDINGS: There is no edema or consolidation. Heart size and pulmonary vascularity are normal. No adenopathy. There is aortic atherosclerosis. No evident bone lesions. No pneumothorax. There is calcification in each carotid artery. IMPRESSION: Aortic atherosclerosis. No edema or consolidation. There are foci of carotid artery calcification. Aortic Atherosclerosis (ICD10-I70.0). Electronically Signed   By: Lowella Grip III M.D.   On: 10/02/2017 21:10   Ct Angio Chest Pe W/cm &/or Wo Cm  Result Date: 10/02/2017 CLINICAL DATA:  Pt brought in by [facility] for c/o chest pain; pt c/o chest pain that started at 1815 while talking on the phone; pt states the pain is sharp and radiates under her left breast; pt took 2 nitroglycerin and 325mg  of ASA at home prior to ems arrival; pt's BP with ems was over 258 systolic; pt states the nitro did not help with the pain EXAM: CT ANGIOGRAPHY CHEST WITH CONTRAST TECHNIQUE: Multidetector CT imaging of the chest was performed using the standard protocol during bolus administration of intravenous contrast. Multiplanar CT image reconstructions and MIPs were obtained to evaluate the vascular anatomy. CONTRAST:  121mL ISOVUE-370 IOPAMIDOL (ISOVUE-370) INJECTION  76% COMPARISON:  Current chest radiographs.  Chest CTA, 02/20/2016 FINDINGS: Cardiovascular: Satisfactory opacification of the pulmonary arteries to the segmental level. No evidence of pulmonary embolism. Normal heart size. No pericardial effusion. Mild coronary artery calcifications. Significant atherosclerotic disease noted along the aortic arch and descending thoracic aorta and involving the origin of the arch branch vessels. Mediastinum/Nodes: No neck base or axillary masses or enlarged lymph nodes. No mediastinal or hilar masses or adenopathy. Trachea is widely patent. Esophagus is unremarkable. Lungs/Pleura: Minor linear and associated ground-glass opacities in the lower lobes, right middle lobe and upper lobes likely due to atelectasis. No convincing pneumonia. No pulmonary edema. No lung mass or nodule. No pleural effusion or pneumothorax. Upper Abdomen: No acute findings. Partly imaged large left upper pole renal cyst. Musculoskeletal: No fracture or acute finding. No osteoblastic or osteolytic lesions. Review of the MIP images confirms the above findings. IMPRESSION: 1. No evidence of a pulmonary embolism. 2. Areas of linear and mild hazy lung opacity that most likely atelectasis. Areas of hazy opacity likely due to air trapping from small airways disease. No evidence of pneumonia or pulmonary edema. 3. Significant atherosclerotic disease of the thoracic aorta. No dissection. No aneurysm. Aortic Atherosclerosis (ICD10-I70.0). Electronically Signed   By: Lajean Manes M.D.   On: 10/02/2017 23:08    Procedures Procedures (including critical care time)  Medications Ordered in ED Medications  ondansetron [facility]) injection 4 mg (4 mg Intravenous Given 10/02/17 2107)  fentaNYL (SUBLIMAZE) injection 25 mcg (25 mcg Intravenous Given 10/02/17 2232)  iopamidol (ISOVUE-370) 76 % injection 100 mL (100 mLs Intravenous Contrast Given 10/02/17 2246)     Initial Impression / Assessment and Plan / ED Course  I  have reviewed the triage vital signs and the nursing notes.  Pertinent labs & imaging results that were available during my care of the patient were reviewed by me and considered in my medical decision making (see chart for details).     Workup here to include CT angios no evidence of pulmonary embolus or any other acute findings.  Initial troponin negative but symptoms of the chest pain just started at 1800.  May have persisted.  Not relieved by aspirin or nitro.  Some improvement with fentanyl.  Patient nontoxic no acute distress.  Patient seems to have a history of coronary disease and has not had it worked up  recently.  Will discuss with hospitalist about admission for chest pain rule out.  Final Clinical Impressions(s) / ED Diagnoses   Final diagnoses:  Precordial pain    ED Discharge Orders    None       Fredia Sorrow, MD 10/02/17 2130    Fredia Sorrow, MD 10/24/17 1024

## 2017-10-03 ENCOUNTER — Encounter (HOSPITAL_COMMUNITY): Payer: Self-pay

## 2017-10-03 ENCOUNTER — Other Ambulatory Visit: Payer: Self-pay

## 2017-10-03 DIAGNOSIS — R072 Precordial pain: Principal | ICD-10-CM

## 2017-10-03 DIAGNOSIS — M797 Fibromyalgia: Secondary | ICD-10-CM

## 2017-10-03 DIAGNOSIS — E785 Hyperlipidemia, unspecified: Secondary | ICD-10-CM

## 2017-10-03 DIAGNOSIS — I10 Essential (primary) hypertension: Secondary | ICD-10-CM | POA: Diagnosis not present

## 2017-10-03 LAB — TROPONIN I
Troponin I: <0.03 ng/mL (ref <0.03)
Troponin I: <0.03 ng/mL (ref <0.03)

## 2017-10-03 LAB — COMPREHENSIVE METABOLIC PANEL
ALBUMIN: 3.4 g/dL — AB (ref 3.5–5.0)
ALK PHOS: 50 U/L (ref 38–126)
ALT: 8 U/L — ABNORMAL LOW (ref 14–54)
ANION GAP: 9 (ref 5–15)
AST: 13 U/L — ABNORMAL LOW (ref 15–41)
BUN: 6 mg/dL (ref 6–20)
CALCIUM: 8.5 mg/dL — AB (ref 8.9–10.3)
CO2: 22 mmol/L (ref 22–32)
Chloride: 104 mmol/L (ref 101–111)
Creatinine, Ser: 0.8 mg/dL (ref 0.44–1.00)
GFR calc Af Amer: >60 mL/min (ref >60)
GFR calc non Af Amer: >60 mL/min (ref >60)
GLUCOSE: 110 mg/dL — AB (ref 65–99)
Potassium: 3.7 mmol/L (ref 3.5–5.1)
Sodium: 135 mmol/L (ref 135–145)
TOTAL PROTEIN: 6 g/dL — AB (ref 6.5–8.1)
Total Bilirubin: 0.5 mg/dL (ref 0.3–1.2)

## 2017-10-03 LAB — CBC
HCT: 37.7 % (ref 36.0–46.0)
HEMOGLOBIN: 12.4 g/dL (ref 12.0–15.0)
MCH: 30.4 pg (ref 26.0–34.0)
MCHC: 32.9 g/dL (ref 30.0–36.0)
MCV: 92.4 fL (ref 78.0–100.0)
Platelets: 215 10*3/uL (ref 150–400)
RBC: 4.08 MIL/uL (ref 3.87–5.11)
RDW: 13.4 % (ref 11.5–15.5)
WBC: 5.9 10*3/uL (ref 4.0–10.5)

## 2017-10-03 LAB — CREATININE, SERUM
Creatinine, Ser: 0.75 mg/dL (ref 0.44–1.00)
GFR calc Af Amer: >60 mL/min (ref >60)
GFR calc non Af Amer: >60 mL/min (ref >60)

## 2017-10-03 LAB — LIPASE, BLOOD: LIPASE: 25 U/L (ref 11–51)

## 2017-10-03 MED ORDER — ENOXAPARIN SODIUM 40 MG/0.4ML ~~LOC~~ SOLN
40.0000 mg | SUBCUTANEOUS | Status: DC
  Administered 2017-10-03: 40 mg via SUBCUTANEOUS
  Filled 2017-10-03: qty 0.4

## 2017-10-03 MED ORDER — AMLODIPINE BESYLATE 5 MG PO TABS
10.0000 mg | ORAL_TABLET | Freq: Every day | ORAL | Status: DC
  Filled 2017-10-03: qty 2

## 2017-10-03 MED ORDER — ACETAMINOPHEN 650 MG RE SUPP: 650.0000 mg | Freq: Four times a day (QID) | RECTAL | Status: DC | PRN

## 2017-10-03 MED ORDER — PRAVASTATIN SODIUM 40 MG PO TABS
40.0000 mg | ORAL_TABLET | Freq: Every day | ORAL | Status: DC
  Administered 2017-10-03: 40 mg via ORAL
  Filled 2017-10-03: qty 1

## 2017-10-03 MED ORDER — ONDANSETRON HCL 4 MG/2ML IJ SOLN
4.0000 mg | Freq: Four times a day (QID) | INTRAMUSCULAR | Status: DC | PRN
  Administered 2017-10-03: 4 mg via INTRAVENOUS
  Filled 2017-10-03: qty 2

## 2017-10-03 MED ORDER — IBUPROFEN 600 MG PO TABS: 600.0000 mg | ORAL_TABLET | Freq: Three times a day (TID) | ORAL | 0 refills | Status: DC | PRN

## 2017-10-03 MED ORDER — PANTOPRAZOLE SODIUM 40 MG IV SOLR
40.0000 mg | INTRAVENOUS | Status: DC
  Administered 2017-10-03: 40 mg via INTRAVENOUS
  Filled 2017-10-03: qty 40

## 2017-10-03 MED ORDER — LINACLOTIDE 145 MCG PO CAPS
290.0000 ug | ORAL_CAPSULE | Freq: Every day | ORAL | Status: DC
  Filled 2017-10-03: qty 2

## 2017-10-03 MED ORDER — ONDANSETRON HCL 4 MG PO TABS: 4.0000 mg | ORAL_TABLET | Freq: Four times a day (QID) | ORAL | Status: DC | PRN

## 2017-10-03 MED ORDER — SODIUM CHLORIDE 0.9 % IV SOLN
INTRAVENOUS | Status: DC
  Administered 2017-10-03: 03:00:00 via INTRAVENOUS

## 2017-10-03 MED ORDER — OXYCODONE-ACETAMINOPHEN 5-325 MG PO TABS
1.0000 | ORAL_TABLET | Freq: Four times a day (QID) | ORAL | Status: DC | PRN
  Administered 2017-10-03: 2 via ORAL
  Filled 2017-10-03: qty 2

## 2017-10-03 MED ORDER — GI COCKTAIL ~~LOC~~
30.0000 mL | Freq: Once | ORAL | Status: DC
  Filled 2017-10-03: qty 30

## 2017-10-03 MED ORDER — KETOROLAC TROMETHAMINE 15 MG/ML IJ SOLN
15.0000 mg | Freq: Four times a day (QID) | INTRAMUSCULAR | Status: DC
  Administered 2017-10-03: 15 mg via INTRAVENOUS
  Filled 2017-10-03: qty 1

## 2017-10-03 MED ORDER — ACETAMINOPHEN 325 MG PO TABS: 650.0000 mg | ORAL_TABLET | Freq: Four times a day (QID) | ORAL | Status: DC | PRN

## 2017-10-03 MED ORDER — DULOXETINE HCL 30 MG PO CPEP: 30.0000 mg | ORAL_CAPSULE | Freq: Every day | ORAL | 2 refills | Status: DC

## 2017-10-03 NOTE — Discharge Summary (Signed)
Physician Discharge Summary  Dawn Mckay GGE:[id number] DOB: 03-28-1951 DOA: 10/02/2017  PCP: Patient, No Pcp Per Patient receives her care at the [facility] date: 10/02/2017 Discharge date: 10/03/2017  Admitted From: home Disposition:  home  Recommendations for Outpatient Follow-up:  1. Follow up with PCP in 1-2 weeks 2. Please obtain BMP/CBC in one week 3. Patient has been referred to outpatient pain management clinic  Home Health: Equipment/Devices:  Discharge Condition: stable CODE STATUS:full code Diet recommendation: Heart Healthy   Brief/Interim Summary: 67 year old female with a history of fibromyalgia and chronic pain, presents to the hospital with complaints of chest pain.  Patient reports that she has intermittent left-sided chest pain which radiates to her back, is sharp and worse with movement for the past several years.  This is gotten worse since 6 PM overnight.  She denies any injury to this area.  Since she has a history of coronary disease, she was admitted to the hospital and ruled out for ACS with negative cardiac markers and no acute changes on EKG.  CT Angio of the chest was also done that ruled out any pulmonary embolus.  Patient's pain was very atypical and not related to any underlying cardiac cause.  She reports using Lidoderm patches.  She says that she has an allergy to amitriptyline, gabapentin and Lyrica.  Since she does have significant fibromyalgia, we will start her on low-dose Cymbalta.  She is been asked to follow-up with her primary care physician.  She will also be referred to pain management clinic.  Patient is otherwise stable for discharge.  Discharge Diagnoses:  Active Problems:   Chest pain   Hypertension   CAD (coronary artery disease), native coronary artery   HLD (hyperlipidemia)   Fibromyalgia    Discharge Instructions  Discharge Instructions    Diet - low sodium heart healthy   Complete by:  As directed    Increase activity slowly    Complete by:  As directed      Allergies as of 10/03/2017      Reactions   Amitriptyline Anaphylaxis   Gabapentin Swelling   Lyrica [pregabalin] Other (See Comments)   Out of control   Tramadol Swelling   Of head and tongue      Medication List    TAKE these medications   albuterol 108 (90 Base) MCG/ACT inhaler Commonly known as:  PROVENTIL HFA;VENTOLIN HFA Inhale 2 puffs into the lungs every 6 (six) hours as needed for wheezing or shortness of breath.   amLODipine 10 MG tablet Commonly known as:  NORVASC Take 10 mg by mouth daily.   aspirin EC 325 MG tablet Take 325 mg by mouth daily.   DULoxetine 30 MG capsule Commonly known as:  CYMBALTA Take 1 capsule (30 mg total) by mouth daily.   HYDROcodone-acetaminophen 5-325 MG tablet Commonly known as:  NORCO Take 1 tablet by mouth every 4 (four) hours as needed.   ibuprofen 600 MG tablet Commonly known as:  ADVIL,MOTRIN Take 1 tablet (600 mg total) by mouth every 8 (eight) hours as needed for moderate pain.   lidocaine 5 % Commonly known as:  LIDODERM Place 1 patch onto the skin daily as needed.   linaclotide 290 MCG Caps capsule Commonly known as:  LINZESS Take 1 capsule by mouth daily.   LIQUID TEARS OP Apply 4-6 drops to eye daily as needed (dry eyes).   nitroGLYCERIN 0.4 MG SL tablet Commonly known as:  NITROSTAT Place 0.4 mg under the tongue every  5 (five) minutes as needed for chest pain.   ondansetron 8 MG tablet Commonly known as:  ZOFRAN Take 1 tablet (8 mg total) by mouth every 8 (eight) hours as needed for nausea or vomiting.   pravastatin 40 MG tablet Commonly known as:  PRAVACHOL Take 40 mg by mouth daily.   promethazine 25 MG tablet Commonly known as:  PHENERGAN Take 25 mg by mouth as needed.       Allergies  Allergen Reactions  . Amitriptyline Anaphylaxis  . Gabapentin Swelling  . Lyrica [Pregabalin] Other (See Comments)    Out of control  . Tramadol Swelling    Of head and tongue     Consultations:     Procedures/Studies: Dg Chest 2 View  Result Date: 10/02/2017 CLINICAL DATA:  Chest pain EXAM: CHEST  2 VIEW COMPARISON:  June 14, 2017 FINDINGS: There is no edema or consolidation. Heart size and pulmonary vascularity are normal. No adenopathy. There is aortic atherosclerosis. No evident bone lesions. No pneumothorax. There is calcification in each carotid artery. IMPRESSION: Aortic atherosclerosis. No edema or consolidation. There are foci of carotid artery calcification. Aortic Atherosclerosis (ICD10-I70.0). Electronically Signed   By: Lowella Grip III M.D.   On: 10/02/2017 21:10   Ct Angio Chest Pe W/cm &/or Wo Cm  Result Date: 10/02/2017 CLINICAL DATA:  Pt brought in by [facility] for c/o chest pain; pt c/o chest pain that started at 1815 while talking on the phone; pt states the pain is sharp and radiates under her left breast; pt took 2 nitroglycerin and 325mg  of ASA at home prior to ems arrival; pt's BP with ems was over 921 systolic; pt states the nitro did not help with the pain EXAM: CT ANGIOGRAPHY CHEST WITH CONTRAST TECHNIQUE: Multidetector CT imaging of the chest was performed using the standard protocol during bolus administration of intravenous contrast. Multiplanar CT image reconstructions and MIPs were obtained to evaluate the vascular anatomy. CONTRAST:  121mL ISOVUE-370 IOPAMIDOL (ISOVUE-370) INJECTION 76% COMPARISON:  Current chest radiographs.  Chest CTA, 02/20/2016 FINDINGS: Cardiovascular: Satisfactory opacification of the pulmonary arteries to the segmental level. No evidence of pulmonary embolism. Normal heart size. No pericardial effusion. Mild coronary artery calcifications. Significant atherosclerotic disease noted along the aortic arch and descending thoracic aorta and involving the origin of the arch branch vessels. Mediastinum/Nodes: No neck base or axillary masses or enlarged lymph nodes. No mediastinal or hilar masses or adenopathy. Trachea is  widely patent. Esophagus is unremarkable. Lungs/Pleura: Minor linear and associated ground-glass opacities in the lower lobes, right middle lobe and upper lobes likely due to atelectasis. No convincing pneumonia. No pulmonary edema. No lung mass or nodule. No pleural effusion or pneumothorax. Upper Abdomen: No acute findings. Partly imaged large left upper pole renal cyst. Musculoskeletal: No fracture or acute finding. No osteoblastic or osteolytic lesions. Review of the MIP images confirms the above findings. IMPRESSION: 1. No evidence of a pulmonary embolism. 2. Areas of linear and mild hazy lung opacity that most likely atelectasis. Areas of hazy opacity likely due to air trapping from small airways disease. No evidence of pneumonia or pulmonary edema. 3. Significant atherosclerotic disease of the thoracic aorta. No dissection. No aneurysm. Aortic Atherosclerosis (ICD10-I70.0). Electronically Signed   By: Lajean Manes M.D.   On: 10/02/2017 23:08      Subjective: Continues to have pain in left chest that radiates around to left back. Pain is sharp, worse with palpation, deep breath or any movement of arm/shoulder.   Discharge Exam:  Vitals:   10/03/17 1204 10/03/17 1350  BP: (!) 125/58 121/60  Pulse: 62 65  Resp:  18  Temp:  98.4 F (36.9 C)  SpO2:  95%   Vitals:   10/03/17 0220 10/03/17 0630 10/03/17 1204 10/03/17 1350  BP: 102/65 (!) 100/55 (!) 125/58 121/60  Pulse: 67 61 62 65  Resp: 18 17  18   Temp: 98.7 F (37.1 C) 98.1 F (36.7 C)  98.4 F (36.9 C)  TempSrc: Oral Oral  Oral  SpO2: 99% 96%  95%  Weight: 76.3 kg (168 lb 3.4 oz)     Height: 5\' 2"  (1.575 m)       General: Pt is alert, awake, not in acute distress Cardiovascular: RRR, S1/S2 +, no rubs, no gallops Respiratory: CTA bilaterally, no wheezing, no rhonchi, tenderness to palpation over left chest and left back Abdominal: Soft, NT, ND, bowel sounds + Extremities: no edema, no cyanosis    The results of significant  diagnostics from this hospitalization (including imaging, microbiology, ancillary and laboratory) are listed below for reference.     Microbiology: No results found for this or any previous visit (from the past 240 hour(s)).   Labs: BNP (last 3 results) No results for input(s): BNP in the last 8760 hours. Basic Metabolic Panel: Recent Labs  Lab 10/02/17 2050 10/03/17 0330  NA 136 135  K 3.8 3.7  CL 104 104  CO2 21* 22  GLUCOSE 103* 110*  BUN 7 6  CREATININE 0.67 0.75  0.80  CALCIUM 9.0 8.5*   Liver Function Tests: Recent Labs  Lab 10/03/17 0330  AST 13*  ALT 8*  ALKPHOS 50  BILITOT 0.5  PROT 6.0*  ALBUMIN 3.4*   Recent Labs  Lab 10/02/17 2050  LIPASE 25   No results for input(s): AMMONIA in the last 168 hours. CBC: Recent Labs  Lab 10/02/17 2050 10/03/17 0330  WBC 6.7 5.9  HGB 13.4 12.4  HCT 40.2 37.7  MCV 91.4 92.4  PLT 223 215   Cardiac Enzymes: Recent Labs  Lab 10/03/17 0330 10/03/17 0830 10/03/17 1350  TROPONINI <0.03 <0.03 <0.03   BNP: Invalid input(s): POCBNP CBG: No results for input(s): GLUCAP in the last 168 hours. D-Dimer No results for input(s): DDIMER in the last 72 hours. Hgb A1c No results for input(s): HGBA1C in the last 72 hours. Lipid Profile No results for input(s): CHOL, HDL, LDLCALC, TRIG, CHOLHDL, LDLDIRECT in the last 72 hours. Thyroid function studies No results for input(s): TSH, T4TOTAL, T3FREE, THYROIDAB in the last 72 hours.  Invalid input(s): FREET3 Anemia work up No results for input(s): VITAMINB12, FOLATE, FERRITIN, TIBC, IRON, RETICCTPCT in the last 72 hours. Urinalysis    Component Value Date/Time   COLORURINE STRAW (A) 04/19/2017 2140   APPEARANCEUR CLEAR 04/19/2017 2140   LABSPEC 1.001 (L) 04/19/2017 2140   PHURINE 7.0 04/19/2017 2140   GLUCOSEU NEGATIVE 04/19/2017 2140   HGBUR SMALL (A) 04/19/2017 2140   BILIRUBINUR NEGATIVE 04/19/2017 2140   KETONESUR NEGATIVE 04/19/2017 2140   PROTEINUR  NEGATIVE 04/19/2017 2140   UROBILINOGEN 0.2 12/15/2014 1418   NITRITE NEGATIVE 04/19/2017 2140   LEUKOCYTESUR NEGATIVE 04/19/2017 2140   Sepsis Labs Invalid input(s): PROCALCITONIN,  WBC,  LACTICIDVEN Microbiology No results found for this or any previous visit (from the past 240 hour(s)).   Time coordinating discharge: Over 30 minutes  SIGNED:   Kathie Dike, MD  [facility] 10/03/2017, 3:06 PM Pager   If 7PM-7AM, please contact night-coverage [email or web address] Password [email or web address]

## 2017-10-03 NOTE — Clinical Social Work Note (Signed)
Patient's niece, Brantley Fling, will be providing transportation for patient to go home.     Kalei Mckillop, Clydene Pugh, LCSW

## 2017-10-03 NOTE — Care Management (Signed)
CM consulted for transportation needs. CSW notified and will address.

## 2017-10-03 NOTE — Progress Notes (Signed)
Patient's IV removed.  Site WNL.  AVS reviewed with patient.  Verbalized understanding of discharge instructions, physician follow-up, medications.  Dr. Freddie Apley office to call patient with appointment.  Patient refused wheelchair.  Ambulated to entrance at discharge.   Patient stable at time of discharge.

## 2017-10-03 NOTE — Progress Notes (Signed)
Late entry:  Patient c/o pain worse with deep breaths.  Patient NPO.  No IV route pain medication.  Dr. Roderic Palau notified via text page.

## 2017-10-03 NOTE — H&P (Signed)
TRH H&P    Patient Demographics:    Dawn Mckay, is a 67 y.o. female  MRN: [id number]  DOB - 1951-03-22  Admit Date - 10/02/2017  Referring MD/NP/PA: Dr. Rogene Houston  Outpatient Primary MD for the patient is Patient, No Pcp Per  Patient coming from: home  Chief complaint-chest pain   HPI:    Dawn Mckay  is a 67 y.o. female, with history of hypertension, carotid artery stenosis, history of breast cancer, Jerrye Bushy, came to hospital with chief complaint of chest pain which started around 6 PM tonight. Patient says that she took two nitroglycerin and 325 mg aspirin at home without any improvement. She also had surgery shortness of breath. Complains of nausea but no vomiting. Patient had a normal stress test in 2014. Patient describes the pain as the epigastric region with radiation to left under the rib. She denies fever, but complains of being cold. No dysuria urgency or frequency of urination. Denies diarrhea. Has a history of TIA. Carotid artery stenosis on left     Review of systems:      All other systems reviewed and are negative.   With Past History of the following :    Past Medical History:  Diagnosis Date  . CAD (coronary artery disease)   . Chronic chest pain   . Chronic headache   . GERD (gastroesophageal reflux disease)   . History of breast cancer   . History of stress test 07/2013   normal  . History of stress test 2003   normal adenosine cardiolite study  . Hypertension       Past Surgical History:  Procedure Laterality Date  . BRAIN SURGERY    . BREAST SURGERY     due to Cancer  . CESAREAN SECTION    . EYE SURGERY    . THROAT SURGERY        Social History:      Social History   Tobacco Use  . Smoking status: Current Every Day Smoker    Packs/day: 0.25    Types: Cigarettes  . Smokeless tobacco: Never Used  Substance Use Topics  . Alcohol use: No        Family History :   Father had prostate cancer no family history of heart disease   Home Medications:   Prior to Admission medications   Medication Sig Start Date End Date Taking? Authorizing Provider  albuterol (PROVENTIL HFA;VENTOLIN HFA) 108 (90 Base) MCG/ACT inhaler Inhale 2 puffs into the lungs every 6 (six) hours as needed for wheezing or shortness of breath. 02/22/16   Kathie Dike, MD  amLODipine (NORVASC) 10 MG tablet Take 10 mg by mouth daily.    [provider]  aspirin EC 325 MG tablet Take 325 mg by mouth daily.    [provider]  Cyanocobalamin (B-12 PO) Take 1 tablet by mouth daily.    [provider]  docusate sodium (COLACE) 100 MG capsule Take 200 mg by mouth as needed for mild constipation.     [provider]  HYDROcodone-acetaminophen (  NORCO) 5-325 MG tablet Take 1 tablet by mouth every 4 (four) hours as needed. 06/14/17   Daleen Bo, MD  lidocaine (LIDODERM) 5 % Place 1 patch onto the skin daily as needed.    [provider]  linaclotide (LINZESS) 290 MCG CAPS capsule Take 1 capsule by mouth daily.    [provider]  nitroGLYCERIN (NITROSTAT) 0.4 MG SL tablet Place 0.4 mg under the tongue every 5 (five) minutes as needed for chest pain.    [provider]  ondansetron (ZOFRAN) 8 MG tablet Take 1 tablet (8 mg total) by mouth every 8 (eight) hours as needed for nausea or vomiting. 06/14/17   Daleen Bo, MD  Polyvinyl Alcohol (LIQUID TEARS OP) Apply 4-6 drops to eye daily as needed (dry eyes).    [provider]  pravastatin (PRAVACHOL) 40 MG tablet Take 40 mg by mouth daily.    [provider]  promethazine (PHENERGAN) 25 MG tablet Take 25 mg by mouth as needed. 04/14/17   [provider]     Allergies:     Allergies  Allergen Reactions  . Amitriptyline Anaphylaxis  . Gabapentin Swelling  . Lyrica [Pregabalin] Other (See Comments)    Out of control  . Tramadol  Swelling    Of head and tongue     Physical Exam:   Vitals  Blood pressure (!) 104/50, pulse 63, temperature 97.7 F (36.5 C), temperature source Oral, resp. rate 19, height 5\' 3"  (1.6 m), weight 72.6 kg (160 lb), SpO2 98 %.  1.  General: appears in no acute distress  2. Psychiatric:  Intact judgement and  insight, awake alert, oriented x 3.  3. Neurologic: No focal neurological deficits, all cranial nerves intact.Strength 5/5 all 4 extremities, sensation intact all 4 extremities, plantars down going.  4. Eyes :  anicteric sclerae, moist conjunctivae with no lid lag. PERRLA.  5. ENMT:  Oropharynx clear with moist mucous membranes and good dentition  6. Neck:  supple, no cervical lymphadenopathy appriciated, No thyromegaly  7. Respiratory : Normal respiratory effort, good air movement bilaterally,clear to  auscultation bilaterally  8. Cardiovascular : RRR, no gallops, rubs or murmurs, no leg edema  9. Gastrointestinal:  Positive bowel sounds, abdomen soft, positive tenderness to palpation in the epigastric region ,no hepatosplenomegaly, no rigidity or guarding       10. Skin:  No cyanosis, normal texture and turgor, no rash, lesions or ulcers  11.Musculoskeletal:  Good muscle tone,  joints appear normal , no effusions,  normal range of motion    Data Review:    CBC Recent Labs  Lab 10/02/17 2050  WBC 6.7  HGB 13.4  HCT 40.2  PLT 223  MCV 91.4  MCH 30.5  MCHC 33.3  RDW 13.2   ------------------------------------------------------------------------------------------------------------------  Chemistries  Recent Labs  Lab 10/02/17 2050  NA 136  K 3.8  CL 104  CO2 21*  GLUCOSE 103*  BUN 7  CREATININE 0.67  CALCIUM 9.0    ------------------------------------------------------------------------------------------------------------------  ------------------------------------------------------------------------------------------------------------------  --------------------------------------------------------------------------------------------------------------- Urine analysis:    Component Value Date/Time   COLORURINE STRAW (A) 04/19/2017 2140   APPEARANCEUR CLEAR 04/19/2017 2140   LABSPEC 1.001 (L) 04/19/2017 2140   PHURINE 7.0 04/19/2017 2140   GLUCOSEU NEGATIVE 04/19/2017 2140   HGBUR SMALL (A) 04/19/2017 2140   BILIRUBINUR NEGATIVE 04/19/2017 2140   KETONESUR NEGATIVE 04/19/2017 2140   PROTEINUR NEGATIVE 04/19/2017 2140   UROBILINOGEN 0.2 12/15/2014 1418   NITRITE NEGATIVE 04/19/2017 2140   LEUKOCYTESUR NEGATIVE  04/19/2017 2140      Imaging Results:    Dg Chest 2 View  Result Date: 10/02/2017 CLINICAL DATA:  Chest pain EXAM: CHEST  2 VIEW COMPARISON:  June 14, 2017 FINDINGS: There is no edema or consolidation. Heart size and pulmonary vascularity are normal. No adenopathy. There is aortic atherosclerosis. No evident bone lesions. No pneumothorax. There is calcification in each carotid artery. IMPRESSION: Aortic atherosclerosis. No edema or consolidation. There are foci of carotid artery calcification. Aortic Atherosclerosis (ICD10-I70.0). Electronically Signed   By: Lowella Grip III M.D.   On: 10/02/2017 21:10   Ct Angio Chest Pe W/cm &/or Wo Cm  Result Date: 10/02/2017 CLINICAL DATA:  Pt brought in by [facility] for c/o chest pain; pt c/o chest pain that started at 1815 while talking on the phone; pt states the pain is sharp and radiates under her left breast; pt took 2 nitroglycerin and 325mg  of ASA at home prior to ems arrival; pt's BP with ems was over 810 systolic; pt states the nitro did not help with the pain EXAM: CT ANGIOGRAPHY CHEST WITH CONTRAST TECHNIQUE: Multidetector CT imaging of  the chest was performed using the standard protocol during bolus administration of intravenous contrast. Multiplanar CT image reconstructions and MIPs were obtained to evaluate the vascular anatomy. CONTRAST:  18mL ISOVUE-370 IOPAMIDOL (ISOVUE-370) INJECTION 76% COMPARISON:  Current chest radiographs.  Chest CTA, 02/20/2016 FINDINGS: Cardiovascular: Satisfactory opacification of the pulmonary arteries to the segmental level. No evidence of pulmonary embolism. Normal heart size. No pericardial effusion. Mild coronary artery calcifications. Significant atherosclerotic disease noted along the aortic arch and descending thoracic aorta and involving the origin of the arch branch vessels. Mediastinum/Nodes: No neck base or axillary masses or enlarged lymph nodes. No mediastinal or hilar masses or adenopathy. Trachea is widely patent. Esophagus is unremarkable. Lungs/Pleura: Minor linear and associated ground-glass opacities in the lower lobes, right middle lobe and upper lobes likely due to atelectasis. No convincing pneumonia. No pulmonary edema. No lung mass or nodule. No pleural effusion or pneumothorax. Upper Abdomen: No acute findings. Partly imaged large left upper pole renal cyst. Musculoskeletal: No fracture or acute finding. No osteoblastic or osteolytic lesions. Review of the MIP images confirms the above findings. IMPRESSION: 1. No evidence of a pulmonary embolism. 2. Areas of linear and mild hazy lung opacity that most likely atelectasis. Areas of hazy opacity likely due to air trapping from small airways disease. No evidence of pneumonia or pulmonary edema. 3. Significant atherosclerotic disease of the thoracic aorta. No dissection. No aneurysm. Aortic Atherosclerosis (ICD10-I70.0). Electronically Signed   By: Lajean Manes M.D.   On: 10/02/2017 23:08    My personal review of EKG: Rhythm NSR   Assessment & Plan:    Active Problems:   Chest pain   Hypertension   CAD (coronary artery disease), native  coronary artery   HLD (hyperlipidemia)   1. Chest pain-pain is located more than the epigastric region with tenderness to palpation. Will check serum lipase. One dose of G.I. cocktail, start Protonix 40 mg IV Q 24 hours. Will obtain serial troponin Q6 hours time three. 2. Hyperlipidemia-continue pravastatin 3. Hypertension-continue amlodipine 4. IBS-continue Linzess    DVT Prophylaxis-   Lovenox   AM Labs Ordered, also please review Full Orders  Family Communication: Admission, patients condition and plan of care including tests being ordered have been discussed with the patient  who indicate understanding and agree with the plan and Code Status.  Code Status: full code  Admission  status: observation    Time spent in minutes : 60 minutes   Oswald Hillock M.D on 10/03/2017 at 1:03 AM  Between 7am to 7pm - Pager - [phone]. After 7pm go to [email or web address] - password [email or web address]  [facility] - Office  [phone]

## 2018-01-02 ENCOUNTER — Telehealth: Payer: Self-pay | Admitting: Gastroenterology

## 2018-01-02 ENCOUNTER — Encounter: Payer: Self-pay | Admitting: Gastroenterology

## 2018-01-02 NOTE — Telephone Encounter (Signed)
Dr. Ardis Hughs reviewed records and has accepted patient. Ok to schedule Office Visit. Left message for patient to return my call

## 2018-01-02 NOTE — Telephone Encounter (Signed)
Received referral from the [facility] to schedule OV to discuss EGD and Colon. Patient says that she is not wanting to transfer care but that the [facility] is wanting her to be seen here for EGD and Colon. Dr. Ardis Hughs is Doc of the Day for 12/30/17 PM. Records placed on his desk for review.

## 2018-03-03 ENCOUNTER — Encounter: Payer: Self-pay | Admitting: Gastroenterology

## 2018-03-03 ENCOUNTER — Ambulatory Visit (INDEPENDENT_AMBULATORY_CARE_PROVIDER_SITE_OTHER): Payer: No Typology Code available for payment source | Admitting: Gastroenterology

## 2018-03-03 ENCOUNTER — Telehealth: Payer: Self-pay

## 2018-03-03 VITALS — BP 144/60 | HR 84 | Ht 62.25 in | Wt 175.5 lb

## 2018-03-03 DIAGNOSIS — R140 Abdominal distension (gaseous): Secondary | ICD-10-CM

## 2018-03-03 DIAGNOSIS — R109 Unspecified abdominal pain: Secondary | ICD-10-CM

## 2018-03-03 NOTE — Progress Notes (Signed)
HPI: This is a pleasant 67 year old woman who was referred to me Dr. Linton Ham MD at the [facility]  Chief complaint is history of colon polyps, history of Barrett's esophagus.  She has long-standing constipation.  She was referred from the [place] system for a colonoscopy and an upper endoscopy.  Third "community care consult" because she never followed through with the first 2.  She is to return back to his care after those procedures per his office note.   Old Data Reviewed: I reviewed an office note from the [place] dated Dec 27, 2017.  In it, there is notes about how they recommended she have an endoscopy and colonoscopy, a "community care consult" was placed however she did not go to 2 of these appointments before.  They decided to try a third community care referral which is here.  Also in that office note there are notes about previous procedures.  "She had a "colonoscopy with MAC January 2017 revealing solid stool throughout the colon.  A small polyp removed from the hepatic flexure.  It was recommended she have a repeat colonoscopy in 1 year.  Also EGD May 2016 revealing LA class C erosive esophagitis, erosive gastritis and a 4 cm hiatal hernia.  Distal esophageal biopsies showed intestinal metaplasia."    Review of systems: Pertinent positive and negative review of systems were noted in the above HPI section. All other review negative.   Past Medical History:  Diagnosis Date  . Anxiety   . Arthritis   . CAD (coronary artery disease)   . Cervical cancer ([place])   . Chronic chest pain   . Chronic headache   . Colon polyps   . COPD (chronic obstructive pulmonary disease) ([place])   . Depression   . Fibromyalgia   . GERD (gastroesophageal reflux disease)   . Hepatitis C   . History of breast cancer   . History of stress test 07/2013   normal  . History of stress test 2003   normal adenosine cardiolite study  . HLD (hyperlipidemia)   . Hypertension   . IBS (irritable bowel  syndrome)   . Pneumonia   . Pulmonary embolism ([place])   . TIA (transient ischemic attack)     Past Surgical History:  Procedure Laterality Date  . BRAIN SURGERY    . BREAST SURGERY     due to Cancer  . CATARACT EXTRACTION W/ INTRAOCULAR LENS IMPLANT Bilateral   . CESAREAN SECTION    . PARTIAL HYSTERECTOMY    . THROAT SURGERY    . TOTAL ABDOMINAL HYSTERECTOMY      Current Outpatient Medications  Medication Sig Dispense Refill  . albuterol (PROVENTIL HFA;VENTOLIN HFA) 108 (90 Base) MCG/ACT inhaler Inhale 2 puffs into the lungs every 6 (six) hours as needed for wheezing or shortness of breath. 1 Inhaler 2  . amLODipine (NORVASC) 10 MG tablet Take 10 mg by mouth daily.    Marland Kitchen aspirin EC 325 MG tablet Take 325 mg by mouth daily.    Marland Kitchen atorvastatin (LIPITOR) 40 MG tablet Take 1 tablet by mouth daily.    . budesonide-formoterol (SYMBICORT) 160-4.5 MCG/ACT inhaler Inhale 1 puff into the lungs daily.    . Cholecalciferol (VITAMIN D PO) Take 1 tablet by mouth daily.    . Cyanocobalamin (VITAMIN B-12 PO) Take 1 tablet by mouth daily.    . Dentifrices (BIOTENE DRY MOUTH) GEL Place onto teeth.    . hydrocortisone cream 0.5 % Apply 1 application topically 2 (two) times  daily.    . ketotifen (ZADITOR) 0.025 % ophthalmic solution Place 1 drop into both eyes 2 (two) times daily.    Marland Kitchen lidocaine (LIDODERM) 5 % Place 1 patch onto the skin daily as needed.    . linaclotide (LINZESS) 290 MCG CAPS capsule Take 1 capsule by mouth daily.    . Melatonin 3 MG CAPS Take 2 tablets by mouth at bedtime.    . Menthol-Methyl Salicylate (CALYPXO) 6-94 % CREA Apply 1 application topically 4 (four) times daily.    . methocarbamol (ROBAXIN) 500 MG tablet Take 1 tablet by mouth daily.    . nitroGLYCERIN (NITROSTAT) 0.4 MG SL tablet Place 0.4 mg under the tongue every 5 (five) minutes as needed for chest pain.    Marland Kitchen ondansetron (ZOFRAN) 8 MG tablet Take 1 tablet (8 mg total) by mouth every 8 (eight) hours as needed for  nausea or vomiting. 20 tablet 0  . Polyvinyl Alcohol (LIQUID TEARS OP) Apply 4-6 drops to eye daily as needed (dry eyes).    . pravastatin (PRAVACHOL) 40 MG tablet Take 40 mg by mouth daily.    . Probiotic Product (PROBIOTIC PO) Take 1 tablet by mouth daily.    . promethazine (PHENERGAN) 25 MG tablet Take 25 mg by mouth as needed.     No current facility-administered medications for this visit.     Allergies as of 03/03/2018 - Review Complete 03/03/2018  Allergen Reaction Noted  . Amitriptyline Anaphylaxis 10/25/2016  . Neurontin [gabapentin] Swelling 03/31/2017  . Lyrica [pregabalin] Other (See Comments) 06/14/2017  . Tramadol Swelling 06/14/2017    Family History  Problem Relation Age of Onset  . Kidney cancer Mother   . Colon cancer Father   . Heart disease Father   . Colon cancer Brother   . Heart disease Maternal Grandmother   . Colon cancer Brother     Social History   Socioeconomic History  . Marital status: Divorced    Spouse name: Not on file  . Number of children: 3  . Years of education: Not on file  . Highest education level: Not on file  Occupational History  . Occupation: retired  Scientific laboratory technician  . Financial resource strain: Not on file  . Food insecurity:    Worry: Not on file    Inability: Not on file  . Transportation needs:    Medical: Not on file    Non-medical: Not on file  Tobacco Use  . Smoking status: Current Every Day Smoker    Packs/day: 0.25    Types: Cigarettes  . Smokeless tobacco: Never Used  Substance and Sexual Activity  . Alcohol use: No  . Drug use: No  . Sexual activity: Not on file  Lifestyle  . Physical activity:    Days per week: Not on file    Minutes per session: Not on file  . Stress: Not on file  Relationships  . Social connections:    Talks on phone: Not on file    Gets together: Not on file    Attends religious service: Not on file    Active member of club or organization: Not on file    Attends meetings of  clubs or organizations: Not on file    Relationship status: Not on file  . Intimate partner violence:    Fear of current or ex partner: Not on file    Emotionally abused: Not on file    Physically abused: Not on file    Forced sexual activity:  Not on file  Other Topics Concern  . Not on file  Social History Narrative  . Not on file     Physical Exam: BP (!) 144/60 (BP Location: Left Arm, Patient Position: Sitting, Cuff Size: Normal)   Pulse 84   Ht 5' 2.25" (1.581 m) Comment: height measured without shoes  Wt 175 lb 8 oz (79.6 kg)   BMI 31.84 kg/m  Constitutional: generally well-appearing Psychiatric: alert and oriented x3 Eyes: extraocular movements intact Mouth: oral pharynx moist, no lesions Neck: supple no lymphadenopathy Cardiovascular: heart regular rate and rhythm Lungs: clear to auscultation bilaterally Abdomen: soft, nontender, nondistended, no obvious ascites, no peritoneal signs, normal bowel sounds Extremities: no lower extremity edema bilaterally Skin: no lesions on visible extremities   Assessment and plan: 67 y.o. female with chronic constipation, history of colon polyps, history of Barrett's esophagus  She was referred by the [facility] for colonoscopy and upper endoscopy.  Her referring physician wanted her to return to his care after those procedures have been done and he outlined a plan of care for her.  I am happy to do those here since she cannot get them done through the [place] system.  I will communicate my results with the [facility] after the procedures are complete.  She had a poorly prepped colon 2 years ago and a polyp was removed.  I believe it was a sessile serrated polyp.  Since the prep was poor and there was documented solid stool throughout she is going to undergo a double prep protocol here.  She also was found to have Barrett's esophagus based on a 2015 EGD.  I actually do not have either of those procedure reports, this information is gleaned from  referring physician notes.    Please see the "Patient Instructions" section for addition details about the plan.   Owens Loffler, MD [facility] 03/03/2018, 10:18 AM  Cc: No ref. provider found

## 2018-03-03 NOTE — H&P (View-Only) (Signed)
HPI: This is a pleasant 67-year-old woman who was referred to me Dr. Scott Brazer MD at the [facility]  Chief complaint is history of colon polyps, history of Barrett's esophagus.  She has long-standing constipation.  [facility] and [facility]oscopy.  Third "community care consult" because she never followed through with the first 2.  She is to return back to his care after those procedures per his office note.   Old Data Reviewed: [facility].  In it, there is notes about how they recommended she have an endoscopy and colonoscopy, a "community care consult" was placed however she did not go to 2 of these appointments before.  They decided to try a third community care referral which is here.  Also in that office note there are notes about previous procedures.  "She had a "colonoscopy with MAC January 2017 revealing solid stool throughout the colon.  A small polyp removed from the hepatic flexure.  It was recommended she have a repeat colonoscopy in 1 year.  Also EGD May 2016 revealing LA class C erosive esophagitis, erosive gastritis and a 4 cm hiatal hernia.  Distal esophageal biopsies showed intestinal metaplasia."    Review of systems: Pertinent positive and negative review of systems were noted in the above HPI section. All other review negative.   Past Medical History:  Diagnosis Date  . Anxiety   . Arthritis   . CAD (coronary artery disease)   . Cervical cancer ([facility])   . Chronic chest pain   . Chronic headache   . Colon polyps   . COPD (chronic obstructive pulmonary disease) ([facility])   . Depression   . Fibromyalgia   . GERD (gastroesophageal reflux disease)   . Hepatitis C   . History of breast cancer   . History of stress test 07/2013   normal  . History of stress test 2003   normal adenosine cardiolite study  . HLD (hyperlipidemia)   . Hypertension   . IBS (irritable bowel  syndrome)   . Pneumonia   . Pulmonary embolism ([facility])   . TIA (transient ischemic attack)     Past Surgical History:  Procedure Laterality Date  . BRAIN SURGERY    . BREAST SURGERY     due to Cancer  . CATARACT EXTRACTION W/ INTRAOCULAR LENS IMPLANT Bilateral   . CESAREAN SECTION    . PARTIAL HYSTERECTOMY    . THROAT SURGERY    . TOTAL ABDOMINAL HYSTERECTOMY      Current Outpatient Medications  Medication Sig Dispense Refill  . albuterol (PROVENTIL HFA;VENTOLIN HFA) 108 (90 Base) MCG/ACT inhaler Inhale 2 puffs into the lungs every 6 (six) hours as needed for wheezing or shortness of breath. 1 Inhaler 2  . amLODipine (NORVASC) 10 MG tablet Take 10 mg by mouth daily.    . aspirin EC 325 MG tablet Take 325 mg by mouth daily.    . atorvastatin (LIPITOR) 40 MG tablet Take 1 tablet by mouth daily.    . budesonide-formoterol (SYMBICORT) 160-4.5 MCG/ACT inhaler Inhale 1 puff into the lungs daily.    . Cholecalciferol (VITAMIN D PO) Take 1 tablet by mouth daily.    . Cyanocobalamin (VITAMIN B-12 PO) Take 1 tablet by mouth daily.    . Dentifrices (BIOTENE DRY MOUTH) GEL Place onto teeth.    . hydrocortisone cream 0.5 % Apply 1 application topically 2 (two) times   daily.    . ketotifen (ZADITOR) 0.025 % ophthalmic solution Place 1 drop into both eyes 2 (two) times daily.    . lidocaine (LIDODERM) 5 % Place 1 patch onto the skin daily as needed.    . linaclotide (LINZESS) 290 MCG CAPS capsule Take 1 capsule by mouth daily.    . Melatonin 3 MG CAPS Take 2 tablets by mouth at bedtime.    . Menthol-Methyl Salicylate (CALYPXO) 3-10 % CREA Apply 1 application topically 4 (four) times daily.    . methocarbamol (ROBAXIN) 500 MG tablet Take 1 tablet by mouth daily.    . nitroGLYCERIN (NITROSTAT) 0.4 MG SL tablet Place 0.4 mg under the tongue every 5 (five) minutes as needed for chest pain.    . ondansetron (ZOFRAN) 8 MG tablet Take 1 tablet (8 mg total) by mouth every 8 (eight) hours as needed for  nausea or vomiting. 20 tablet 0  . Polyvinyl Alcohol (LIQUID TEARS OP) Apply 4-6 drops to eye daily as needed (dry eyes).    . pravastatin (PRAVACHOL) 40 MG tablet Take 40 mg by mouth daily.    . Probiotic Product (PROBIOTIC PO) Take 1 tablet by mouth daily.    . promethazine (PHENERGAN) 25 MG tablet Take 25 mg by mouth as needed.     No current facility-administered medications for this visit.     Allergies as of 03/03/2018 - Review Complete 03/03/2018  Allergen Reaction Noted  . Amitriptyline Anaphylaxis 10/25/2016  . Neurontin [gabapentin] Swelling 03/31/2017  . Lyrica [pregabalin] Other (See Comments) 06/14/2017  . Tramadol Swelling 06/14/2017    Family History  Problem Relation Age of Onset  . Kidney cancer Mother   . Colon cancer Father   . Heart disease Father   . Colon cancer Brother   . Heart disease Maternal Grandmother   . Colon cancer Brother     Social History   Socioeconomic History  . Marital status: Divorced    Spouse name: Not on file  . Number of children: 3  . Years of education: Not on file  . Highest education level: Not on file  Occupational History  . Occupation: retired  Social Needs  . Financial resource strain: Not on file  . Food insecurity:    Worry: Not on file    Inability: Not on file  . Transportation needs:    Medical: Not on file    Non-medical: Not on file  Tobacco Use  . Smoking status: Current Every Day Smoker    Packs/day: 0.25    Types: Cigarettes  . Smokeless tobacco: Never Used  Substance and Sexual Activity  . Alcohol use: No  . Drug use: No  . Sexual activity: Not on file  Lifestyle  . Physical activity:    Days per week: Not on file    Minutes per session: Not on file  . Stress: Not on file  Relationships  . Social connections:    Talks on phone: Not on file    Gets together: Not on file    Attends religious service: Not on file    Active member of club or organization: Not on file    Attends meetings of  clubs or organizations: Not on file    Relationship status: Not on file  . Intimate partner violence:    Fear of current or ex partner: Not on file    Emotionally abused: Not on file    Physically abused: Not on file    Forced sexual activity:   Not on file  Other Topics Concern  . Not on file  Social History Narrative  . Not on file     Physical Exam: BP (!) 144/60 (BP Location: Left Arm, Patient Position: Sitting, Cuff Size: Normal)   Pulse 84   Ht 5' 2.25" (1.581 m) Comment: height measured without shoes  Wt 175 lb 8 oz (79.6 kg)   BMI 31.84 kg/m  Constitutional: generally well-appearing Psychiatric: alert and oriented x3 Eyes: extraocular movements intact Mouth: oral pharynx moist, no lesions Neck: supple no lymphadenopathy Cardiovascular: heart regular rate and rhythm Lungs: clear to auscultation bilaterally Abdomen: soft, nontender, nondistended, no obvious ascites, no peritoneal signs, normal bowel sounds Extremities: no lower extremity edema bilaterally Skin: no lesions on visible extremities   Assessment and plan: 67 y.o. female with chronic constipation, history of colon polyps, history of Barrett's esophagus  [facility].  Her referring physician wanted her to return to his care after those procedures have been done and he outlined a plan of care for her.  I am happy to do those here since she cannot [facility].  [facility] after the procedures are complete.  She had a poorly prepped colon 2 years ago and a polyp was removed.  I believe it was a sessile serrated polyp.  Since the prep was poor and there was documented solid stool throughout she is going to undergo a double prep protocol here.  She also was found to have Barrett's esophagus based on a 2015 EGD.  I actually do not have either of those procedure reports, this information is gleaned from  referring physician notes.    Please see the "Patient Instructions" section for addition details about the plan.   Constance Hackenberg, MD  Gastroenterology 03/03/2018, 10:18 AM  Cc: No ref. provider found  

## 2018-03-03 NOTE — Patient Instructions (Addendum)
Double prep protocol for colonoscopy for incomplete colonoscopy 2017, history of poor prep.  Take 1 of your zofran pills before each of the doses of prep. You will be set up for an upper endoscopy for history of Barrett's change. Both of these procedures were requested by the [place].  Dr Ardis Hughs recommends that you complete a bowel purge (to clean out your bowels). Please do the following: Purchase a bottle of Miralax over the counter.  Wait 1 hour. You will then drink 238 gram bottle mix of Miralax mixed in an adequate amount of water/juice/gatorade (you may choose which of these liquids to drink) over the next 2-3 hours. You should expect results within 1 to 6 hours after completing the bowel purge.  Normal BMI (Body Mass Index- based on height and weight) is between 19 and 25. Your BMI today is Body mass index is 31.84 kg/m. Marland Kitchen Please consider follow up  regarding your BMI with your Primary Care Provider.

## 2018-03-03 NOTE — Telephone Encounter (Signed)
I spoke to patient to let her know that the date and time for the hospital colon, EGD has been changed. The date and time will be 03/23/18 at 7:30am. She understands to be at [facility] at [place]. Patient also voiced understanding what date to start bowel purge and prep. She will call the office before the procedure if she has any questions

## 2018-03-13 ENCOUNTER — Telehealth: Payer: Self-pay | Admitting: Gastroenterology

## 2018-03-13 NOTE — Telephone Encounter (Signed)
[facility]

## 2018-03-13 NOTE — Telephone Encounter (Signed)
Carla from [place] called they will cover the medication for the pt because she cant paid for it, but they need the order to be fax to them [phone] and if you have any questions call [facility] [phone] ext [phone]

## 2018-03-13 NOTE — Telephone Encounter (Signed)
I have tried to call the number provided and it will not go thru.  The prep is OTC.  I will try later to reach someone at the number

## 2018-03-21 ENCOUNTER — Encounter (HOSPITAL_COMMUNITY): Payer: Self-pay | Admitting: *Deleted

## 2018-03-22 NOTE — Anesthesia Preprocedure Evaluation (Addendum)
Anesthesia Evaluation  Patient identified by MRN, date of birth, ID band Patient awake    Reviewed: Allergy & Precautions, NPO status , Patient's Chart, lab work & pertinent test results  History of Anesthesia Complications Negative for: history of anesthetic complications  Airway Mallampati: II  TM Distance: >3 FB Neck ROM: Full    Dental  (+) Edentulous Upper, Edentulous Lower   Pulmonary COPD,  COPD inhaler, Current Smoker, PE   breath sounds clear to auscultation       Cardiovascular hypertension, Pt. on medications + CAD   Rhythm:Regular Rate:Normal     Neuro/Psych  Headaches, Anxiety Depression TIA   GI/Hepatic GERD  Controlled and Medicated,(+) Hepatitis -, C IBS    Endo/Other   Obesity   Renal/GU negative Renal ROS  negative genitourinary   Musculoskeletal  (+) Arthritis , Fibromyalgia -  Abdominal   Peds  Hematology negative hematology ROS (+)   Anesthesia Other Findings   Reproductive/Obstetrics Cervical cancer                            Anesthesia Physical Anesthesia Plan  ASA: III  Anesthesia Plan: MAC   Post-op Pain Management:    Induction: Intravenous  PONV Risk Score and Plan: 2 and Propofol infusion and Treatment may vary due to age or medical condition  Airway Management Planned: Nasal Cannula and Natural Airway  Additional Equipment: None  Intra-op Plan:   Post-operative Plan:   Informed Consent: I have reviewed the patients History and Physical, chart, labs and discussed the procedure including the risks, benefits and alternatives for the proposed anesthesia with the patient or authorized representative who has indicated his/her understanding and acceptance.     Plan Discussed with: CRNA and Anesthesiologist  Anesthesia Plan Comments:        Anesthesia Quick Evaluation

## 2018-03-23 ENCOUNTER — Ambulatory Visit (HOSPITAL_COMMUNITY): Payer: No Typology Code available for payment source | Admitting: Anesthesiology

## 2018-03-23 ENCOUNTER — Encounter (HOSPITAL_COMMUNITY)
Admission: RE | Disposition: A | Discharge status: Home / Self Care | Payer: Self-pay | Source: Ambulatory Visit | Attending: Gastroenterology

## 2018-03-23 ENCOUNTER — Other Ambulatory Visit: Payer: Self-pay

## 2018-03-23 ENCOUNTER — Encounter (HOSPITAL_COMMUNITY): Payer: Self-pay | Admitting: *Deleted

## 2018-03-23 ENCOUNTER — Ambulatory Visit (HOSPITAL_COMMUNITY)
Admission: RE | Admit: 2018-03-23 | Discharge: 2018-03-23 | Disposition: A | Discharge status: Home / Self Care | Payer: No Typology Code available for payment source | Source: Ambulatory Visit | Attending: Gastroenterology | Admitting: Gastroenterology

## 2018-03-23 DIAGNOSIS — D123 Benign neoplasm of transverse colon: Secondary | ICD-10-CM | POA: Diagnosis not present

## 2018-03-23 DIAGNOSIS — F329 Major depressive disorder, single episode, unspecified: Secondary | ICD-10-CM | POA: Diagnosis not present

## 2018-03-23 DIAGNOSIS — Z1211 Encounter for screening for malignant neoplasm of colon: Secondary | ICD-10-CM | POA: Diagnosis present

## 2018-03-23 DIAGNOSIS — F17210 Nicotine dependence, cigarettes, uncomplicated: Secondary | ICD-10-CM | POA: Diagnosis not present

## 2018-03-23 DIAGNOSIS — K589 Irritable bowel syndrome without diarrhea: Secondary | ICD-10-CM | POA: Insufficient documentation

## 2018-03-23 DIAGNOSIS — K635 Polyp of colon: Secondary | ICD-10-CM

## 2018-03-23 DIAGNOSIS — K449 Diaphragmatic hernia without obstruction or gangrene: Secondary | ICD-10-CM

## 2018-03-23 DIAGNOSIS — B1920 Unspecified viral hepatitis C without hepatic coma: Secondary | ICD-10-CM | POA: Diagnosis not present

## 2018-03-23 DIAGNOSIS — Z8541 Personal history of malignant neoplasm of cervix uteri: Secondary | ICD-10-CM | POA: Diagnosis not present

## 2018-03-23 DIAGNOSIS — K209 Esophagitis, unspecified without bleeding: Secondary | ICD-10-CM

## 2018-03-23 DIAGNOSIS — K2270 Barrett's esophagus without dysplasia: Secondary | ICD-10-CM | POA: Diagnosis not present

## 2018-03-23 DIAGNOSIS — K2970 Gastritis, unspecified, without bleeding: Secondary | ICD-10-CM | POA: Diagnosis not present

## 2018-03-23 DIAGNOSIS — K210 Gastro-esophageal reflux disease with esophagitis: Secondary | ICD-10-CM | POA: Diagnosis not present

## 2018-03-23 DIAGNOSIS — I10 Essential (primary) hypertension: Secondary | ICD-10-CM | POA: Insufficient documentation

## 2018-03-23 DIAGNOSIS — K621 Rectal polyp: Secondary | ICD-10-CM | POA: Diagnosis not present

## 2018-03-23 DIAGNOSIS — M1990 Unspecified osteoarthritis, unspecified site: Secondary | ICD-10-CM | POA: Diagnosis not present

## 2018-03-23 DIAGNOSIS — K3189 Other diseases of stomach and duodenum: Secondary | ICD-10-CM | POA: Diagnosis not present

## 2018-03-23 DIAGNOSIS — Z8673 Personal history of transient ischemic attack (TIA), and cerebral infarction without residual deficits: Secondary | ICD-10-CM | POA: Insufficient documentation

## 2018-03-23 DIAGNOSIS — Z860100 Personal history of colon polyps, unspecified: Secondary | ICD-10-CM

## 2018-03-23 DIAGNOSIS — M797 Fibromyalgia: Secondary | ICD-10-CM | POA: Insufficient documentation

## 2018-03-23 DIAGNOSIS — D122 Benign neoplasm of ascending colon: Secondary | ICD-10-CM | POA: Diagnosis not present

## 2018-03-23 DIAGNOSIS — Z853 Personal history of malignant neoplasm of breast: Secondary | ICD-10-CM | POA: Insufficient documentation

## 2018-03-23 DIAGNOSIS — Z8249 Family history of ischemic heart disease and other diseases of the circulatory system: Secondary | ICD-10-CM | POA: Insufficient documentation

## 2018-03-23 DIAGNOSIS — K5900 Constipation, unspecified: Secondary | ICD-10-CM | POA: Diagnosis not present

## 2018-03-23 DIAGNOSIS — Z888 Allergy status to other drugs, medicaments and biological substances status: Secondary | ICD-10-CM | POA: Insufficient documentation

## 2018-03-23 DIAGNOSIS — Z885 Allergy status to narcotic agent status: Secondary | ICD-10-CM | POA: Insufficient documentation

## 2018-03-23 DIAGNOSIS — E785 Hyperlipidemia, unspecified: Secondary | ICD-10-CM | POA: Diagnosis not present

## 2018-03-23 DIAGNOSIS — Z86711 Personal history of pulmonary embolism: Secondary | ICD-10-CM | POA: Insufficient documentation

## 2018-03-23 DIAGNOSIS — J449 Chronic obstructive pulmonary disease, unspecified: Secondary | ICD-10-CM | POA: Insufficient documentation

## 2018-03-23 DIAGNOSIS — E669 Obesity, unspecified: Secondary | ICD-10-CM | POA: Diagnosis not present

## 2018-03-23 DIAGNOSIS — Z7982 Long term (current) use of aspirin: Secondary | ICD-10-CM | POA: Insufficient documentation

## 2018-03-23 DIAGNOSIS — F419 Anxiety disorder, unspecified: Secondary | ICD-10-CM | POA: Insufficient documentation

## 2018-03-23 DIAGNOSIS — Z79899 Other long term (current) drug therapy: Secondary | ICD-10-CM | POA: Insufficient documentation

## 2018-03-23 DIAGNOSIS — R109 Unspecified abdominal pain: Secondary | ICD-10-CM

## 2018-03-23 DIAGNOSIS — I2510 Atherosclerotic heart disease of native coronary artery without angina pectoris: Secondary | ICD-10-CM | POA: Diagnosis not present

## 2018-03-23 DIAGNOSIS — Z8719 Personal history of other diseases of the digestive system: Secondary | ICD-10-CM

## 2018-03-23 DIAGNOSIS — R140 Abdominal distension (gaseous): Secondary | ICD-10-CM

## 2018-03-23 DIAGNOSIS — Z800 Family history of malignant neoplasm of digestive organs: Secondary | ICD-10-CM | POA: Insufficient documentation

## 2018-03-23 DIAGNOSIS — Z6832 Body mass index (BMI) 32.0-32.9, adult: Secondary | ICD-10-CM | POA: Insufficient documentation

## 2018-03-23 HISTORY — PX: ESOPHAGOGASTRODUODENOSCOPY (EGD) WITH PROPOFOL: SHX5813

## 2018-03-23 HISTORY — PX: POLYPECTOMY: SHX5525

## 2018-03-23 HISTORY — PX: COLONOSCOPY WITH PROPOFOL: SHX5780

## 2018-03-23 HISTORY — PX: BIOPSY: SHX5522

## 2018-03-23 SURGERY — COLONOSCOPY WITH PROPOFOL
Anesthesia: Monitor Anesthesia Care

## 2018-03-23 MED ORDER — PHENYLEPHRINE HCL 10 MG/ML IJ SOLN
INTRAMUSCULAR | Status: DC | PRN
  Administered 2018-03-23 (×2): 80 ug via INTRAVENOUS

## 2018-03-23 MED ORDER — PROPOFOL 500 MG/50ML IV EMUL
INTRAVENOUS | Status: DC | PRN
  Administered 2018-03-23: 300 ug/kg/min via INTRAVENOUS

## 2018-03-23 MED ORDER — GLYCOPYRROLATE 0.2 MG/ML IJ SOLN
INTRAMUSCULAR | Status: DC | PRN
  Administered 2018-03-23: 0.1 mg via INTRAVENOUS

## 2018-03-23 MED ORDER — LACTATED RINGERS IV SOLN
INTRAVENOUS | Status: DC
  Administered 2018-03-23: 1000 mL via INTRAVENOUS
  Administered 2018-03-23: 08:00:00 via INTRAVENOUS

## 2018-03-23 MED ORDER — LABETALOL HCL 5 MG/ML IV SOLN
INTRAVENOUS | Status: DC | PRN
  Administered 2018-03-23: 2.5 mg via INTRAVENOUS

## 2018-03-23 MED ORDER — LIDOCAINE HCL (CARDIAC) PF 100 MG/5ML IV SOSY
PREFILLED_SYRINGE | INTRAVENOUS | Status: DC | PRN
  Administered 2018-03-23: 75 mg via INTRAVENOUS

## 2018-03-23 MED ORDER — OMEPRAZOLE 40 MG PO CPDR: 40.0000 mg | DELAYED_RELEASE_CAPSULE | Freq: Two times a day (BID) | ORAL | 11 refills | Status: AC

## 2018-03-23 MED ORDER — LIDOCAINE VISCOUS HCL 2 % MT SOLN
OROMUCOSAL | Status: AC
  Filled 2018-03-23: qty 15

## 2018-03-23 MED ORDER — ALBUTEROL SULFATE HFA 108 (90 BASE) MCG/ACT IN AERS
INHALATION_SPRAY | RESPIRATORY_TRACT | Status: DC | PRN
  Administered 2018-03-23: 6 via RESPIRATORY_TRACT

## 2018-03-23 MED ORDER — LIDOCAINE VISCOUS HCL 2 % MT SOLN
15.0000 mL | Freq: Once | OROMUCOSAL | Status: AC
  Administered 2018-03-23: 15 mL via OROMUCOSAL

## 2018-03-23 MED ORDER — PROPOFOL 10 MG/ML IV BOLUS
INTRAVENOUS | Status: AC
  Filled 2018-03-23: qty 60

## 2018-03-23 MED ORDER — SODIUM CHLORIDE 0.9 % IV SOLN: INTRAVENOUS | Status: DC

## 2018-03-23 SURGICAL SUPPLY — 24 items

## 2018-03-23 NOTE — Op Note (Signed)
[facility] Patient Name: Dawn Mckay Procedure Date: 03/23/2018 MRN: [id number] Attending MD: [name] , MD Date of Birth: May 26, 1951 CSN: [id number] Age: 67 Admit Type: Outpatient Procedure:                Colonoscopy Indications:              High risk colon cancer surveillance: Personal                            history of colonic polyps; poorly prepped colon at                            the [facility] Jan 2017 with at least one polyp removed Providers:                [name], MD, Burtis Junes, RN, Cherylynn Ridges,                            Technician, Enrigue Catena, CRNA Referring MD:             Linton Ham, MD at [facility]. Medicines:                Monitored Anesthesia Care Complications:            No immediate complications. Estimated blood loss:                            None. Estimated Blood Loss:     Estimated blood loss: none. Procedure:                Pre-Anesthesia Assessment:                           - Prior to the procedure, a History and Physical                            was performed, and patient medications and                            allergies were reviewed. The patient's tolerance of                            previous anesthesia was also reviewed. The risks                            and benefits of the procedure and the sedation                            options and risks were discussed with the patient.                            All questions were answered, and informed consent                            was obtained. Prior Anticoagulants: The patient has  taken no previous anticoagulant or antiplatelet                            agents. ASA Grade Assessment: III - A patient with                            severe systemic disease. After reviewing the risks                            and benefits, the patient was deemed in                            satisfactory condition to undergo the  procedure.                           After obtaining informed consent, the colonoscope                            was passed under direct vision. Throughout the                            procedure, the patient's blood pressure, pulse, and                            oxygen saturations were monitored continuously. The                            CF-HQ190L ([id number] Olympus adult colonoscope was                            introduced through the anus and advanced to the the                            cecum, identified by appendiceal orifice and                            ileocecal valve. The colonoscopy was performed                            without difficulty. The patient tolerated the                            procedure well. The quality of the bowel                            preparation was good. The ileocecal valve,                            appendiceal orifice, and rectum were photographed. Scope In: 7:30:51 AM Scope Out: 7:48:56 AM Scope Withdrawal Time: 0 hours 13 minutes 8 seconds  Total Procedure Duration: 0 hours 18 minutes 5 seconds  Findings:      Two sessile polyps were found in the transverse colon and ascending       colon. The polyps were  3 to 4 mm in size. These polyps were removed with       a cold snare. Resection and retrieval were complete.      Multiple sessile polyps were found in the recto-sigmoid colon. The       polyps were 1 to 2 mm in size. These polyps were removed with a cold       snare. Resection and retrieval were complete.      The exam was otherwise without abnormality on direct and retroflexion       views. Impression:               - Two 3 to 4 mm polyps in the transverse colon and                            in the ascending colon, removed with a cold snare.                            Resected and retrieved. Jar 1.                           - Multiple 1 to 2 mm polyps at the recto-sigmoid                            colon. These appeared hyperplastic  and were sampled                            with cold snare (4 polyps removed). Jar 2.                           - The examination was otherwise normal on direct                            and retroflexion views. Moderate Sedation:      N/A- Per Anesthesia Care Recommendation:           - Patient has a contact number available for                            emergencies. The signs and symptoms of potential                            delayed complications were discussed with the                            patient. Return to normal activities tomorrow.                            Written discharge instructions were provided to the                            patient.                           - Resume previous diet.                           -  Continue present medications.                           You will receive a letter within 2-3 weeks with the                            pathology results and my final recommendations.                           If the polyp(s) is proven to be 'pre-cancerous' on                            pathology, you will need repeat colonoscopy in 3-5                            years. If the polyp(s) is NOT 'precancerous' on                            pathology then you should repeat colon cancer                            screening in 10 years with colonoscopy without need                            for colon cancer screening by any method prior to                            then (including stool testing). Procedure Code(s):        --- Professional ---                           [phone], Colonoscopy, flexible; with removal of                            tumor(s), polyp(s), or other lesion(s) by snare                            technique Diagnosis Code(s):        --- Professional ---                           Z86.010, Personal history of colonic polyps                           D12.3, Benign neoplasm of transverse colon (hepatic                            flexure or  splenic flexure)                           D12.2, Benign neoplasm of ascending colon                           D12.7, Benign neoplasm of rectosigmoid junction CPT copyright 2017 American [employer]. All rights  reserved. The codes documented in this report are preliminary and upon coder review may  be revised to meet current compliance requirements. [name], MD 03/23/2018 8:04:17 AM This report has been signed electronically. Number of Addenda: 0

## 2018-03-23 NOTE — Transfer of Care (Signed)
Immediate Anesthesia Transfer of Care Note  Patient: Priscila Bean  Procedure(s) Performed: COLONOSCOPY WITH PROPOFOL (N/A ) ESOPHAGOGASTRODUODENOSCOPY (EGD) WITH PROPOFOL (N/A ) POLYPECTOMY BIOPSY  Patient Location: PACU  Anesthesia Type:MAC  Level of Consciousness: awake, alert , oriented and patient cooperative  Airway & Oxygen Therapy: Patient Spontanous Breathing and Patient connected to face mask oxygen  Post-op Assessment: Report given to RN, Post -op Vital signs reviewed and stable and Patient moving all extremities X 4  Post vital signs: stable  Last Vitals:  Vitals Value Taken Time  BP 115/58 03/23/2018  8:22 AM  Temp 36.5 C 03/23/2018  8:17 AM  Pulse 76 03/23/2018  8:24 AM  Resp 22 03/23/2018  8:24 AM  SpO2 97 % 03/23/2018  8:24 AM  Vitals shown include unvalidated device data.  Last Pain:  Vitals:   03/23/18 0820  TempSrc:   PainSc: (P) 0-No pain         Complications: No apparent anesthesia complications

## 2018-03-23 NOTE — Anesthesia Postprocedure Evaluation (Signed)
Anesthesia Post Note  Patient: Bristyl Mclees  Procedure(s) Performed: COLONOSCOPY WITH PROPOFOL (N/A ) ESOPHAGOGASTRODUODENOSCOPY (EGD) WITH PROPOFOL (N/A ) POLYPECTOMY BIOPSY     Patient location during evaluation: PACU Anesthesia Type: MAC Level of consciousness: awake and alert Pain management: pain level controlled Vital Signs Assessment: post-procedure vital signs reviewed and stable Respiratory status: spontaneous breathing, nonlabored ventilation and respiratory function stable Cardiovascular status: stable and blood pressure returned to baseline Anesthetic complications: no    Last Vitals:  Vitals:   03/23/18 0825 03/23/18 0830  BP: 126/68 130/61  Pulse: 76 69  Resp: (!) 21 (!) 27  Temp:    SpO2: 99% 100%                  Audry Pili

## 2018-03-23 NOTE — Op Note (Signed)
[facility] Patient Name: Dawn Mckay Procedure Date: 03/23/2018 MRN: [id number] Attending MD: [name] , MD Date of Birth: November 15, 1950 CSN: [id number] Age: 67 Admit Type: Outpatient Procedure:                Upper GI endoscopy Indications:              Surveillance procedure; [facility] EGD May 2016                            revealing LA class C erosive esophagitis, erosive                            gastritis and a 4 cm hiatal hernia. Distal                            esophageal biopsies showed intestinal metaplasia." Providers:                [name], MD, Burtis Junes, RN, Cherylynn Ridges,                            Technician, Enrigue Catena, CRNA Referring MD:             Linton Ham, MD at [facility]. Medicines:                Monitored Anesthesia Care Complications:            No immediate complications. Estimated blood loss:                            None. Estimated Blood Loss:     Estimated blood loss: none. Procedure:                Pre-Anesthesia Assessment:                           - Prior to the procedure, a History and Physical                            was performed, and patient medications and                            allergies were reviewed. The patient's tolerance of                            previous anesthesia was also reviewed. The risks                            and benefits of the procedure and the sedation                            options and risks were discussed with the patient.                            All questions were answered, and informed consent  was obtained. Prior Anticoagulants: The patient has                            taken no previous anticoagulant or antiplatelet                            agents. ASA Grade Assessment: III - A patient with                            severe systemic disease. After reviewing the risks                            and benefits, the patient was deemed in                             satisfactory condition to undergo the procedure.                           After obtaining informed consent, the endoscope was                            passed under direct vision. Throughout the                            procedure, the patient's blood pressure, pulse, and                            oxygen saturations were monitored continuously. The                            [id number]-[id number] ([id number]) Olympus adult endoscope was                            introduced through the mouth, and advanced to the                            second part of duodenum. The upper GI endoscopy was                            accomplished without difficulty. The patient                            tolerated the procedure well. Scope In: Scope Out: Findings:      Reflux appearing esophagitis (LA Grade B) with possible uncerlying       Barrett's change (non-nodular). If this is Barrett's, would be [place]       Classification C1M2. Mutliple biopsies taken.      Small hiatal hernia.      The exam was otherwise without abnormality. Impression:               - Reflux appearing esophagitis with possible                            uncerlying Barrett's change (non-nodular). If this  is Barrett's, would be [place] Classification C1M2.                            Mutliple biopsies taken.                           - Small hiatal hernia.                           - The examination was otherwise normal. Moderate Sedation:      N/A- Per Anesthesia Care Recommendation:           - Patient has a contact number available for                            emergencies. The signs and symptoms of potential                            delayed complications were discussed with the                            patient. Return to normal activities tomorrow.                            Written discharge instructions were provided to the                            patient.                            - Resume previous diet.                           - Continue present medications. Please start twice                            daily omeprazole (new prescription written today).                           - Await pathology results. Procedure Code(s):        --- Professional ---                           [phone], Esophagogastroduodenoscopy, flexible,                            transoral; with biopsy, single or multiple Diagnosis Code(s):        --- Professional ---                           K20.9, Esophagitis, unspecified CPT copyright 2017 American Medical Association. All rights reserved. The codes documented in this report are preliminary and upon coder review may  be revised to meet current compliance requirements. [name], MD 03/23/2018 8:11:30 AM This report has been signed electronically. Number of Addenda: 0

## 2018-03-23 NOTE — Interval H&P Note (Signed)
History and Physical Interval Note:  03/23/2018 7:10 AM  Dawn Mckay  has presented today for surgery, with the diagnosis of abdominal pain bloating gas  The various methods of treatment have been discussed with the patient and family. After consideration of risks, benefits and other options for treatment, the patient has consented to  Procedure(s): COLONOSCOPY WITH PROPOFOL (N/A) ESOPHAGOGASTRODUODENOSCOPY (EGD) WITH PROPOFOL (N/A) as a surgical intervention .  The patient's history has been reviewed, patient examined, no change in status, stable for surgery.  I have reviewed the patient's chart and labs.  Questions were answered to the patient's satisfaction.     [name]

## 2018-03-23 NOTE — Anesthesia Procedure Notes (Signed)
Procedure Name: MAC Date/Time: 03/23/2018 7:21 AM Performed by: Lissa Morales, CRNA Pre-anesthesia Checklist: Patient identified, Emergency Drugs available, Suction available, Patient being monitored and Timeout performed Patient Re-evaluated:Patient Re-evaluated prior to induction Oxygen Delivery Method: Nasal cannula Placement Confirmation: positive ETCO2

## 2018-03-23 NOTE — Discharge Instructions (Signed)
YOU HAD AN ENDOSCOPIC PROCEDURE TODAY: Refer to the procedure report and other information in the discharge instructions given to you for any specific questions about what was found during the examination. If this information does not answer your questions, please call [place] office at [phone] to clarify.  ° °YOU SHOULD EXPECT: Some feelings of bloating in the abdomen. Passage of more gas than usual. Walking can help get rid of the air that was put into your GI tract during the procedure and reduce the bloating. If you had a lower endoscopy (such as a colonoscopy or flexible sigmoidoscopy) you may notice spotting of blood in your stool or on the toilet paper. Some abdominal soreness may be present for a day or two, also. ° °DIET: Your first meal following the procedure should be a light meal and then it is ok to progress to your normal diet. A half-sandwich or bowl of soup is an example of a good first meal. Heavy or fried foods are harder to digest and may make you feel nauseous or bloated. Drink plenty of fluids but you should avoid alcoholic beverages for 24 hours. If you had a esophageal dilation, please see attached instructions for diet.   ° °ACTIVITY: Your care partner should take you home directly after the procedure. You should plan to take it easy, moving slowly for the rest of the day. You can resume normal activity the day after the procedure however YOU SHOULD NOT DRIVE, use power tools, machinery or perform tasks that involve climbing or major physical exertion for 24 hours (because of the sedation medicines used during the test).  ° °SYMPTOMS TO REPORT IMMEDIATELY: °A gastroenterologist can be reached at any hour. Please call [phone]  for any of the following symptoms:  °Following lower endoscopy (colonoscopy, flexible sigmoidoscopy) °Excessive amounts of blood in the stool  °Significant tenderness, worsening of abdominal pains  °Swelling of the abdomen that is new, acute  °Fever of 100° or  higher  °Following upper endoscopy (EGD, EUS, ERCP, esophageal dilation) °Vomiting of blood or coffee ground material  °New, significant abdominal pain  °New, significant chest pain or pain under the shoulder blades  °Painful or persistently difficult swallowing  °New shortness of breath  °Black, tarry-looking or red, bloody stools ° °FOLLOW UP:  °If any biopsies were taken you will be contacted by phone or by letter within the next 1-3 weeks. Call [phone]  if you have not heard about the biopsies in 3 weeks.  °Please also call with any specific questions about appointments or follow up tests. ° °

## 2018-03-24 ENCOUNTER — Encounter (HOSPITAL_COMMUNITY): Payer: Self-pay | Admitting: Gastroenterology

## 2018-04-27 DIAGNOSIS — K635 Polyp of colon: Secondary | ICD-10-CM

## 2018-05-06 ENCOUNTER — Emergency Department (HOSPITAL_COMMUNITY): Payer: Medicare Other

## 2018-05-06 ENCOUNTER — Encounter (HOSPITAL_COMMUNITY): Payer: Self-pay

## 2018-05-06 ENCOUNTER — Other Ambulatory Visit: Payer: Self-pay

## 2018-05-06 ENCOUNTER — Emergency Department (HOSPITAL_COMMUNITY)
Admission: EM | Admit: 2018-05-06 | Discharge: 2018-05-06 | Disposition: A | Discharge status: Home / Self Care | Payer: Medicare Other | Attending: Emergency Medicine | Admitting: Emergency Medicine

## 2018-05-06 DIAGNOSIS — F17210 Nicotine dependence, cigarettes, uncomplicated: Secondary | ICD-10-CM | POA: Diagnosis not present

## 2018-05-06 DIAGNOSIS — Z8541 Personal history of malignant neoplasm of cervix uteri: Secondary | ICD-10-CM | POA: Diagnosis not present

## 2018-05-06 DIAGNOSIS — Z86711 Personal history of pulmonary embolism: Secondary | ICD-10-CM | POA: Insufficient documentation

## 2018-05-06 DIAGNOSIS — I10 Essential (primary) hypertension: Secondary | ICD-10-CM | POA: Insufficient documentation

## 2018-05-06 DIAGNOSIS — Z853 Personal history of malignant neoplasm of breast: Secondary | ICD-10-CM | POA: Insufficient documentation

## 2018-05-06 DIAGNOSIS — R079 Chest pain, unspecified: Secondary | ICD-10-CM

## 2018-05-06 DIAGNOSIS — I2510 Atherosclerotic heart disease of native coronary artery without angina pectoris: Secondary | ICD-10-CM | POA: Diagnosis not present

## 2018-05-06 DIAGNOSIS — R0789 Other chest pain: Secondary | ICD-10-CM | POA: Diagnosis present

## 2018-05-06 DIAGNOSIS — J449 Chronic obstructive pulmonary disease, unspecified: Secondary | ICD-10-CM | POA: Diagnosis not present

## 2018-05-06 DIAGNOSIS — Z7982 Long term (current) use of aspirin: Secondary | ICD-10-CM | POA: Diagnosis not present

## 2018-05-06 DIAGNOSIS — Z8673 Personal history of transient ischemic attack (TIA), and cerebral infarction without residual deficits: Secondary | ICD-10-CM | POA: Diagnosis not present

## 2018-05-06 LAB — CBC
HCT: 40 % (ref 36.0–46.0)
Hemoglobin: 13.7 g/dL (ref 12.0–15.0)
MCH: 31.4 pg (ref 26.0–34.0)
MCHC: 34.3 g/dL (ref 30.0–36.0)
MCV: 91.7 fL (ref 78.0–100.0)
Platelets: 242 10*3/uL (ref 150–400)
RBC: 4.36 MIL/uL (ref 3.87–5.11)
RDW: 13.2 % (ref 11.5–15.5)
WBC: 8.9 10*3/uL (ref 4.0–10.5)

## 2018-05-06 LAB — TROPONIN I
Troponin I: <0.03 ng/mL (ref <0.03)
Troponin I: <0.03 ng/mL (ref <0.03)

## 2018-05-06 LAB — BASIC METABOLIC PANEL
Anion gap: 10 (ref 5–15)
BUN: 5 mg/dL — ABNORMAL LOW (ref 8–23)
CO2: 22 mmol/L (ref 22–32)
Calcium: 8.9 mg/dL (ref 8.9–10.3)
Chloride: 108 mmol/L (ref 98–111)
Creatinine, Ser: 0.72 mg/dL (ref 0.44–1.00)
GFR calc Af Amer: >60 mL/min (ref >60)
GFR calc non Af Amer: >60 mL/min (ref >60)
Glucose, Bld: 113 mg/dL — ABNORMAL HIGH (ref 70–99)
Potassium: 3.5 mmol/L (ref 3.5–5.1)
Sodium: 140 mmol/L (ref 135–145)

## 2018-05-06 LAB — I-STAT TROPONIN, ED: Troponin i, poc: 0.01 ng/mL (ref 0.00–0.08)

## 2018-05-06 MED ORDER — LORAZEPAM 1 MG PO TABS
1.0000 mg | ORAL_TABLET | Freq: Once | ORAL | Status: AC
  Administered 2018-05-06: 1 mg via ORAL
  Filled 2018-05-06: qty 1

## 2018-05-06 MED ORDER — KETOROLAC TROMETHAMINE 30 MG/ML IJ SOLN
15.0000 mg | Freq: Once | INTRAMUSCULAR | Status: AC
  Administered 2018-05-06: 15 mg via INTRAVENOUS
  Filled 2018-05-06: qty 1

## 2018-05-06 MED ORDER — MORPHINE SULFATE (PF) 4 MG/ML IV SOLN
4.0000 mg | Freq: Once | INTRAVENOUS | Status: AC
  Administered 2018-05-06: 4 mg via INTRAVENOUS
  Filled 2018-05-06: qty 1

## 2018-05-06 MED ORDER — FENTANYL CITRATE (PF) 100 MCG/2ML IJ SOLN
100.0000 ug | Freq: Once | INTRAMUSCULAR | Status: AC
  Administered 2018-05-06: 100 ug via INTRAVENOUS
  Filled 2018-05-06: qty 2

## 2018-05-06 NOTE — ED Provider Notes (Signed)
[facility] EMERGENCY DEPARTMENT Provider Note   CSN: [id number] Arrival date & time: 05/06/18  1619     History   Chief Complaint Chief Complaint  Patient presents with  . Chest Pain    HPI Dawn Mckay is a 67 y.o. female.  HPI   67 year old female with back and chest pain.  Onset shortly before arrival.  She was at rest sitting at her computer symptoms began.  Describes an ache in her shoulder blades and into her chest bilaterally.  Perhaps mildly worse on the left.  Denies any other symptoms such as dyspnea, palpitations, diaphoresis or nausea.  Also having a cold sensation from her waist down.  She received aspirin and nitroglycerin by EMS without any provement.  Past Medical History:  Diagnosis Date  . Anxiety   . Arthritis   . CAD (coronary artery disease)   . Cervical cancer ([place])   . Chronic chest pain   . Chronic headache   . Colon polyps   . COPD (chronic obstructive pulmonary disease) ([place])   . Depression   . Fibromyalgia   . GERD (gastroesophageal reflux disease)   . Hepatitis C   . History of breast cancer   . History of stress test 07/2013   normal  . History of stress test 2003   normal adenosine cardiolite study  . HLD (hyperlipidemia)   . Hypertension   . IBS (irritable bowel syndrome)   . Pneumonia   . Pulmonary embolism ([place])   . TIA (transient ischemic attack)     Patient Active Problem List   Diagnosis Date Noted  . History of colonic polyps   . Polyp of ascending colon   . History of Barrett's esophagus   . Acute esophagitis   . Hiatal hernia   . Fibromyalgia 10/03/2017  . TIA (transient ischemic attack) 04/19/2017  . HLD (hyperlipidemia) 02/22/2016  . Chronic pain syndrome 02/22/2016  . CAP (community acquired pneumonia) 02/21/2016  . Essential hypertension   . CAD (coronary artery disease), native coronary artery 06/22/2014  . Syncope 10/26/2013  . 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 UTI (lower urinary tract infection)  10/26/2013  . Migraine headache 10/26/2013  . Chest pain 08/04/2013  . Hypertension 08/04/2013  . Hyponatremia 08/04/2013  . Hypokalemia 08/04/2013  . Tobacco use disorder 08/04/2013    Past Surgical History:  Procedure Laterality Date  . BIOPSY  03/23/2018   Procedure: BIOPSY;  Surgeon: [name], MD;  Location: [facility];  Service: Endoscopy;;  . BRAIN SURGERY    . BREAST SURGERY     due to Cancer  . CATARACT EXTRACTION W/ INTRAOCULAR LENS IMPLANT Bilateral   . CESAREAN SECTION    . COLONOSCOPY WITH PROPOFOL N/A 03/23/2018   Procedure: COLONOSCOPY WITH PROPOFOL;  Surgeon: [name], MD;  [facility];  Service: Endoscopy;  Laterality: N/A;  . ESOPHAGOGASTRODUODENOSCOPY (EGD) WITH PROPOFOL N/A 03/23/2018   Procedure: ESOPHAGOGASTRODUODENOSCOPY (EGD) WITH PROPOFOL;  Surgeon: [name], MD;  [facility];  Service: Endoscopy;  Laterality: N/A;  . PARTIAL HYSTERECTOMY    . POLYPECTOMY  03/23/2018   Procedure: POLYPECTOMY;  Surgeon: [name], MD;  [facility]: [facility];  Service: Endoscopy;;  . THROAT SURGERY    . TOTAL ABDOMINAL HYSTERECTOMY       OB History   None      Home Medications    Prior to Admission medications   Medication Sig Start Date End Date Taking? Authorizing Provider  albuterol (PROVENTIL HFA;VENTOLIN  HFA) 108 (90 Base) MCG/ACT inhaler Inhale 2 puffs into the lungs every 6 (six) hours as needed for wheezing or shortness of breath. 02/22/16  Yes Kathie Dike, MD  amLODipine (NORVASC) 5 MG tablet Take 5-10 mg by mouth daily. *Takes 5mg  to 10mg  based on BP levels. If levels are low in the morning, patient does not take and depending on levels, patient takes 5mg  in the morning and is able to take up to 10mg  daily if needed   Yes [provider]  aspirin EC 325 MG tablet Take 325 mg by mouth every morning.    Yes [provider]  atorvastatin (LIPITOR) 40 MG tablet Take 40 mg by mouth at bedtime.     Yes [provider]  budesonide-formoterol (SYMBICORT) 160-4.5 MCG/ACT inhaler Inhale 2 puffs into the lungs 2 (two) times daily.    Yes [provider]  Cholecalciferol (VITAMIN D PO) Take 1 tablet by mouth every morning.    Yes [provider]  Cyanocobalamin (VITAMIN B-12 PO) Take 2 tablets by mouth every morning.    Yes [provider]  lidocaine (LIDODERM) 5 % Place 1-3 patches onto the skin daily as needed (for pain (Back)).    Yes [provider]  linaclotide (LINZESS) 290 MCG CAPS capsule Take 290 mcg by mouth daily before breakfast.    Yes [provider]  Melatonin 3 MG CAPS Take 6 mg by mouth at bedtime as needed (for sleep).    Yes [provider]  Menthol-Methyl Salicylate (CALYPXO) 0-86 % CREA Apply 1 application topically 4 (four) times daily as needed (for pain).    Yes [provider]  methocarbamol (ROBAXIN) 500 MG tablet Take 1,000 mg by mouth daily as needed for muscle spasms.    Yes [provider]  nitroGLYCERIN (NITROSTAT) 0.4 MG SL tablet Place 0.4 mg under the tongue every 5 (five) minutes as needed for chest pain.   Yes [provider]  omeprazole (PRILOSEC) 40 MG capsule Take 1 capsule (40 mg total) by mouth 2 (two) times daily before a meal. 03/23/18  Yes [name], MD  Probiotic Product (PROBIOTIC PO) Take 1 capsule by mouth daily.     [provider]    Family History Family History  Problem Relation Age of Onset  . Kidney cancer Mother   . Colon cancer Father   . Heart disease Father   . Colon cancer Brother   . Heart disease Maternal Grandmother   . Colon cancer Brother     Social History Social History   Tobacco Use  . Smoking status: Current Every Day Smoker    Packs/day: 0.25    Types: Cigarettes  . Smokeless tobacco: Never Used  Substance Use Topics  . Alcohol use: No  . Drug use: No     Allergies   Amitriptyline; Neurontin [gabapentin];  Lyrica [pregabalin]; and Tramadol   Review of Systems Review of Systems All systems reviewed and negative, other than as noted in HPI.   Physical Exam Updated Vital Signs BP (!) 162/88   Pulse 93   Temp 98.1 F (36.7 C) (Oral)   Resp 18   Ht 5\' 2"  (1.575 m)   Wt 73.5 kg   SpO2 98%   BMI 29.63 kg/m   Physical Exam  Constitutional: She appears well-developed and well-nourished. No distress.  HENT:  Head: Normocephalic and atraumatic.  Eyes: Conjunctivae are normal. Right eye exhibits no discharge. Left eye exhibits no discharge.  Neck:  Neck supple.  Cardiovascular: Normal rate, regular rhythm and normal heart sounds. Exam reveals no gallop and no friction rub.  No murmur heard. Feet are warm.  Easily palpable DP pulses bilaterally.  Pulmonary/Chest: Effort normal and breath sounds normal. No respiratory distress.  Abdominal: Soft. She exhibits no distension. There is no tenderness.  Musculoskeletal: She exhibits no edema or tenderness.  Lower extremities symmetric as compared to each other. No calf tenderness. Negative Homan's. No palpable cords.   Neurological: She is alert.  Skin: Skin is warm and dry.  Psychiatric: She has a normal mood and affect. Her behavior is normal. Thought content normal.  Nursing note and vitals reviewed.    ED Treatments / Results  Labs (all labs ordered are listed, but only abnormal results are displayed) Labs Reviewed  BASIC METABOLIC PANEL - Abnormal; Notable for the following components:      Result Value   Glucose, Bld 113 (*)    BUN 5 (*)    All other components within normal limits  CBC  TROPONIN I  TROPONIN I  I-STAT TROPONIN, ED    EKG EKG Interpretation  Date/Time:  Saturday May 06 2018 16:24:51 EDT Ventricular Rate:  82 PR Interval:    QRS Duration: 84 QT Interval:  349 QTC Calculation: 408 R Axis:   36 Text Interpretation:  Sinus rhythm Low voltage, precordial leads Baseline wander in lead(s) II III aVF  Confirmed by Virgel Manifold [id number]) on 05/06/2018 4:33:12 PM   Radiology No results found.   Dg Chest 2 View  Result Date: 05/06/2018 CLINICAL DATA:  Chest pain with pain between shoulder blades radiating to left chest. EXAM: CHEST - 2 VIEW COMPARISON:  10/02/2017 FINDINGS: Lungs are adequately inflated with minimal prominence of the infrahilar markings which may be due to mild degree of vascular congestion. No focal airspace consolidation or effusion. Cardiomediastinal silhouette and remainder of the exam is unchanged. IMPRESSION: Mild prominence of the infrahilar markings which may be due to mild vascular congestion. Electronically Signed   By: Marin Olp M.D.   On: 05/06/2018 17:27    Procedures Procedures (including critical care time)  Medications Ordered in ED Medications  morphine 4 MG/ML injection 4 mg (4 mg Intravenous Given 05/06/18 1706)  ketorolac (TORADOL) 30 MG/ML injection 15 mg (15 mg Intravenous Given 05/06/18 1707)  LORazepam (ATIVAN) tablet 1 mg (1 mg Oral Given 05/06/18 1704)     Initial Impression / Assessment and Plan / ED Course  I have reviewed the triage vital signs and the nursing notes.  Pertinent labs & imaging results that were available during my care of the patient were reviewed by me and considered in my medical decision making (see chart for details).     67 year old female with chest pain.  Sounds atypical for ACS.  Doubt PE.  Consider dissection and pain in the back and the side symptoms in the lower extremities, but I doubt this.  Not sure what to make of this "cold" sensation in her legs.  Warm to the touch.  She is neurovascularly intact.  She denies any lower back pain.  Final Clinical Impressions(s) / ED Diagnoses   Final diagnoses:  Nonspecific chest pain    ED Discharge Orders    None       Virgel Manifold, MD 05/09/18 1724

## 2018-05-06 NOTE — ED Triage Notes (Signed)
Pt reports sitting at her computer and started having pain between shoulder blades radiating around to left chest.  Reports took 1 full strength asa and 2 nitro without relief.  EMS arrived and gave pt another nitro but no change.  Pt also says she feels cold from the waste down.

## 2018-06-01 ENCOUNTER — Other Ambulatory Visit: Payer: Self-pay

## 2018-06-01 ENCOUNTER — Emergency Department (HOSPITAL_COMMUNITY): Payer: Medicare Other

## 2018-06-01 ENCOUNTER — Encounter (HOSPITAL_COMMUNITY): Payer: Self-pay | Admitting: Emergency Medicine

## 2018-06-01 ENCOUNTER — Emergency Department (HOSPITAL_COMMUNITY)
Admission: EM | Admit: 2018-06-01 | Discharge: 2018-06-01 | Disposition: A | Discharge status: Home / Self Care | Payer: Medicare Other | Attending: Emergency Medicine | Admitting: Emergency Medicine

## 2018-06-01 DIAGNOSIS — Z8541 Personal history of malignant neoplasm of cervix uteri: Secondary | ICD-10-CM | POA: Diagnosis not present

## 2018-06-01 DIAGNOSIS — F17210 Nicotine dependence, cigarettes, uncomplicated: Secondary | ICD-10-CM | POA: Insufficient documentation

## 2018-06-01 DIAGNOSIS — Y999 Unspecified external cause status: Secondary | ICD-10-CM | POA: Diagnosis not present

## 2018-06-01 DIAGNOSIS — Z79899 Other long term (current) drug therapy: Secondary | ICD-10-CM | POA: Diagnosis not present

## 2018-06-01 DIAGNOSIS — I2510 Atherosclerotic heart disease of native coronary artery without angina pectoris: Secondary | ICD-10-CM | POA: Diagnosis not present

## 2018-06-01 DIAGNOSIS — W19XXXA Unspecified fall, initial encounter: Secondary | ICD-10-CM | POA: Diagnosis not present

## 2018-06-01 DIAGNOSIS — I10 Essential (primary) hypertension: Secondary | ICD-10-CM | POA: Diagnosis not present

## 2018-06-01 DIAGNOSIS — Y929 Unspecified place or not applicable: Secondary | ICD-10-CM | POA: Diagnosis not present

## 2018-06-01 DIAGNOSIS — Z7982 Long term (current) use of aspirin: Secondary | ICD-10-CM | POA: Diagnosis not present

## 2018-06-01 DIAGNOSIS — Z8673 Personal history of transient ischemic attack (TIA), and cerebral infarction without residual deficits: Secondary | ICD-10-CM | POA: Insufficient documentation

## 2018-06-01 DIAGNOSIS — Y939 Activity, unspecified: Secondary | ICD-10-CM | POA: Insufficient documentation

## 2018-06-01 DIAGNOSIS — J449 Chronic obstructive pulmonary disease, unspecified: Secondary | ICD-10-CM | POA: Insufficient documentation

## 2018-06-01 DIAGNOSIS — M25561 Pain in right knee: Secondary | ICD-10-CM | POA: Insufficient documentation

## 2018-06-01 MED ORDER — MORPHINE SULFATE (PF) 4 MG/ML IV SOLN
4.0000 mg | Freq: Once | INTRAVENOUS | Status: AC
  Administered 2018-06-01: 4 mg via INTRAMUSCULAR
  Filled 2018-06-01: qty 1

## 2018-06-01 MED ORDER — PREDNISONE 20 MG PO TABS
20.0000 mg | ORAL_TABLET | Freq: Once | ORAL | Status: AC
  Administered 2018-06-01: 20 mg via ORAL
  Filled 2018-06-01: qty 1

## 2018-06-01 MED ORDER — PREDNISONE 10 MG PO TABS: 20.0000 mg | ORAL_TABLET | Freq: Every day | ORAL | 0 refills | Status: AC

## 2018-06-01 NOTE — ED Notes (Signed)
Bed: [facility] Expected date:  Expected time:  Means of arrival:  Comments: 

## 2018-06-01 NOTE — ED Provider Notes (Signed)
[facility] DEPT Provider Note   CSN: [id number] Arrival date & time: 06/01/18  1605     History   Chief Complaint Chief Complaint  Patient presents with  . Fall  . Knee Pain    HPI Dawn Mckay is a 67 y.o. female.  67 year old female with prior medical history as detailed below presents with complaint of right leg pain.  Patient reports that she had a fall yesterday.  Ever since the fall she said increased right hip and right knee pain.  Patient reports that she takes oxycodone on a regular basis for her fibromyalgia pain.  She took an oxycodone this morning that did not help with her pain related to the recent fall.  She denies other injury.  She denies head injury or loss of conscious.  She denies neck pain.  She denies chest pain, shortness breath, nausea, vomiting, or other acute complaint.  Her last dose of oxycodone was early this morning.  She did not take anything further because she did not want to "take it before coming to the ED."  The history is provided by the patient and medical records.  Fall  This is a new problem. The current episode started yesterday. The problem occurs rarely. The problem has not changed since onset.Pertinent negatives include no chest pain, no abdominal pain, no headaches and no shortness of breath. The symptoms are aggravated by walking. Nothing relieves the symptoms.  Knee Pain      Past Medical History:  Diagnosis Date  . Anxiety   . Arthritis   . CAD (coronary artery disease)   . Cervical cancer ([place])   . Chronic chest pain   . Chronic headache   . Colon polyps   . COPD (chronic obstructive pulmonary disease) ([place])   . Depression   . Fibromyalgia   . GERD (gastroesophageal reflux disease)   . Hepatitis C   . History of breast cancer   . History of stress test 07/2013   normal  . History of stress test 2003   normal adenosine cardiolite study  . HLD (hyperlipidemia)   . Hypertension   . IBS  (irritable bowel syndrome)   . Pneumonia   . Pulmonary embolism ([place])   . TIA (transient ischemic attack)     Patient Active Problem List   Diagnosis Date Noted  . History of colonic polyps   . Polyp of ascending colon   . History of Barrett's esophagus   . Acute esophagitis   . Hiatal hernia   . Fibromyalgia 10/03/2017  . TIA (transient ischemic attack) 04/19/2017  . HLD (hyperlipidemia) 02/22/2016  . Chronic pain syndrome 02/22/2016  . CAP (community acquired pneumonia) 02/21/2016  . Essential hypertension   . CAD (coronary artery disease), native coronary artery 06/22/2014  . Syncope 10/26/2013  . 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 UTI (lower urinary tract infection) 10/26/2013  . Migraine headache 10/26/2013  . Chest pain 08/04/2013  . Hypertension 08/04/2013  . Hyponatremia 08/04/2013  . Hypokalemia 08/04/2013  . Tobacco use disorder 08/04/2013    Past Surgical History:  Procedure Laterality Date  . BIOPSY  03/23/2018   Procedure: BIOPSY;  Surgeon: [name], MD;  [facility];  Service: Endoscopy;;  . BRAIN SURGERY    . BREAST SURGERY     due to Cancer  . CATARACT EXTRACTION W/ INTRAOCULAR LENS IMPLANT Bilateral   . CESAREAN SECTION    . COLONOSCOPY WITH PROPOFOL N/A 03/23/2018   Procedure: COLONOSCOPY WITH  PROPOFOL;  Surgeon: [name], MD;  Location: [facility];  Service: Endoscopy;  Laterality: N/A;  . ESOPHAGOGASTRODUODENOSCOPY (EGD) WITH PROPOFOL N/A 03/23/2018   Procedure: ESOPHAGOGASTRODUODENOSCOPY (EGD) WITH PROPOFOL;  Surgeon: [name], MD;  Location: [facility];  Service: Endoscopy;  Laterality: N/A;  . PARTIAL HYSTERECTOMY    . POLYPECTOMY  03/23/2018   Procedure: POLYPECTOMY;  Surgeon: [name], MD;  [facility];  Service: Endoscopy;;  . THROAT SURGERY    . TOTAL ABDOMINAL HYSTERECTOMY       OB History   None      Home Medications    Prior to Admission medications   Medication Sig Start Date End  Date Taking? Authorizing Provider  albuterol (PROVENTIL HFA;VENTOLIN HFA) 108 (90 Base) MCG/ACT inhaler Inhale 2 puffs into the lungs every 6 (six) hours as needed for wheezing or shortness of breath. 02/22/16   Kathie Dike, MD  amLODipine (NORVASC) 5 MG tablet Take 5-10 mg by mouth daily. *Takes 5mg  to 10mg  based on BP levels. If levels are low in the morning, patient does not take and depending on levels, patient takes 5mg  in the morning and is able to take up to 10mg  daily if needed    [provider]  aspirin EC 325 MG tablet Take 325 mg by mouth every morning.     [provider]  budesonide-formoterol (SYMBICORT) 160-4.5 MCG/ACT inhaler Inhale 2 puffs into the lungs 2 (two) times daily.     [provider]  Cholecalciferol (VITAMIN D-3) 1000 units CAPS Take 1,000 Units by mouth daily.     [provider]  Cyanocobalamin (VITAMIN B-12 PO) Take 2 tablets by mouth every morning.     [provider]  Dentifrices (BIOTENE DRY MOUTH) GEL Place 1 application onto teeth daily as needed (for burning mouth syndrome).     [provider]  HYDROcodone-acetaminophen (NORCO/VICODIN) 5-325 MG tablet Take 1 tablet by mouth every 6 (six) hours as needed for moderate pain.  04/26/18   [provider]  lidocaine (LIDODERM) 5 % Place 1-3 patches onto the skin daily as needed (for pain (Back)).     [provider]  linaclotide (LINZESS) 290 MCG CAPS capsule Take 290 mcg by mouth daily before breakfast.     [provider]  Melatonin 3 MG CAPS Take 6 mg by mouth at bedtime as needed (for sleep).     [provider]  Menthol-Methyl Salicylate (CALYPXO) 0-35 % CREA Apply 1 application topically 4 (four) times daily as needed (for pain).     [provider]  methocarbamol (ROBAXIN) 500 MG tablet Take 1,000 mg by mouth daily as needed for muscle spasms.     [provider]  nitroGLYCERIN (NITROSTAT) 0.4 MG SL tablet  Place 0.4 mg under the tongue every 5 (five) minutes as needed for chest pain.    [provider]  omeprazole (PRILOSEC) 40 MG capsule Take 1 capsule (40 mg total) by mouth 2 (two) times daily before a meal. 03/23/18   [name], MD  potassium chloride (K-DUR,KLOR-CON) 10 MEQ tablet Take 10 mEq by mouth daily.    [provider]  pravastatin (PRAVACHOL) 40 MG tablet Take 40 mg by mouth every evening.    [provider]  Probiotic Product (PROBIOTIC PO) Take 1 capsule by mouth daily.     [provider]  senna (SENOKOT) 8.6 MG tablet Take 2-4 tablets by mouth daily as needed for constipation. Will take  3 days following Linzess dose if Linzess is ineffective    [provider]    Family History Family History  Problem Relation Age of Onset  . Kidney cancer Mother   . Colon cancer Father   . Heart disease Father   . Colon cancer Brother   . Heart disease Maternal Grandmother   . Colon cancer Brother     Social History Social History   Tobacco Use  . Smoking status: Current Every Day Smoker    Packs/day: 0.25    Types: Cigarettes  . Smokeless tobacco: Never Used  Substance Use Topics  . Alcohol use: No  . Drug use: No     Allergies   Amitriptyline; Neurontin [gabapentin]; Lyrica [pregabalin]; and Tramadol   Review of Systems Review of Systems  Respiratory: Negative for shortness of breath.   Cardiovascular: Negative for chest pain.  Gastrointestinal: Negative for abdominal pain.  Neurological: Negative for headaches.  All other systems reviewed and are negative.    Physical Exam Updated Vital Signs BP 113/64   Pulse 79   Temp 98.1 F (36.7 C) (Oral)   Resp 16   Ht 5\' 3"  (1.6 m)   Wt 74.8 kg   SpO2 93%   BMI 29.23 kg/m   Physical Exam  Constitutional: She is oriented to person, place, and time. She appears well-developed and well-nourished. No distress.  HENT:  Head: Normocephalic and atraumatic.    Mouth/Throat: Oropharynx is clear and moist.  Eyes: Pupils are equal, round, and reactive to light. Conjunctivae and EOM are normal.  Neck: Normal range of motion. Neck supple.  Cardiovascular: Normal rate, regular rhythm and normal heart sounds.  Pulmonary/Chest: Effort normal and breath sounds normal. No respiratory distress.  Abdominal: Soft. She exhibits no distension. There is no tenderness.  Musculoskeletal: Normal range of motion. She exhibits tenderness. She exhibits no edema or deformity.  Diffuse tenderness to the low lateral and anterior aspects of the right leg from the right hip to the right knee.  No ecchymosis noted.  Active range of motion is limited secondary to reported pain.  Distal right lower extremity is neurovascular intact.  Neurological: She is alert and oriented to person, place, and time.  Skin: Skin is warm and dry.  Psychiatric: She has a normal mood and affect.  Nursing note and vitals reviewed.    ED Treatments / Results  Labs (all labs ordered are listed, but only abnormal results are displayed) Labs Reviewed - No data to display  EKG None  Radiology Dg Lumbar Spine Complete  Result Date: 06/01/2018 CLINICAL DATA:  Fall, low back pain EXAM: LUMBAR SPINE - COMPLETE 4+ VIEW COMPARISON:  MRI 04/15/2017 FINDINGS: Degenerative disc disease changes with disc space narrowing most notable at L5-S1 and L1-2. Normal alignment. No fracture or subluxation. SI joints are symmetric and unremarkable. Aortic and iliac calcifications. No visible aneurysm. IMPRESSION: Degenerative disc disease as above.  No acute bony abnormality. Electronically Signed   By: Rolm Baptise M.D.   On: 06/01/2018 18:04   Dg Knee 2 Views Right  Result Date: 06/01/2018 CLINICAL DATA:  Fall, right knee pain EXAM: RIGHT KNEE - 1-2 VIEW COMPARISON:  None. FINDINGS: Small joint effusion. No acute bony abnormality. Specifically, no fracture, subluxation, or dislocation. Soft tissues are intact.  Joint spaces maintained. IMPRESSION: Small joint effusion.  No acute bony abnormality. Electronically Signed   By: Rolm Baptise M.D.   On: 06/01/2018 18:03   Dg Hip Unilat W Or Wo Pelvis  2-3 Views Right  Result Date: 06/01/2018 CLINICAL DATA:  Fall.  Right hip pain EXAM: DG HIP (WITH OR WITHOUT PELVIS) 2-3V RIGHT COMPARISON:  None. FINDINGS: Hip joints and SI joints are symmetric and unremarkable. No acute bony abnormality. Specifically, no fracture, subluxation, or dislocation. IMPRESSION: No acute bony abnormality. Electronically Signed   By: Rolm Baptise M.D.   On: 06/01/2018 18:02    Procedures Procedures (including critical care time)  Medications Ordered in ED Medications  morphine 4 MG/ML injection 4 mg (4 mg Intramuscular Given 06/01/18 1705)     Initial Impression / Assessment and Plan / ED Course  I have reviewed the triage vital signs and the nursing notes.  Pertinent labs & imaging results that were available during my care of the patient were reviewed by me and considered in my medical decision making (see chart for details).    MDM  Screen complete  Patient is presenting for evaluation following a recent reported fall.  She complains of pain along the right leg.  Imaging studies do not reveal acute injury.  She feels improved following administration of pain medicine in the ED.  She understands the need for close follow-up with her regular primary care providers.  She will continue to use her normal oxycodone at home for control of her pain.  She agrees that a short burst of steroids may help her acute on chronic discomfort as well.  She is agreeable with discharge plan of care.   Points of close follow-up was stressed.  Strict return precautions are given and understood.   Final Clinical Impressions(s) / ED Diagnoses   Final diagnoses:  None    ED Discharge Orders    None       Valarie Merino, MD 06/01/18 1827

## 2018-06-01 NOTE — Discharge Instructions (Addendum)
Please return for any problem.  Follow-up with your regular doctors  as instructed at the [place].

## 2018-06-01 NOTE — ED Notes (Signed)
Bed: TC76 Expected date:  Expected time:  Means of arrival:  Comments: Hold for [place] [facility]

## 2019-03-25 ENCOUNTER — Emergency Department (HOSPITAL_COMMUNITY)
Admission: EM | Admit: 2019-03-25 | Discharge: 2019-03-25 | Disposition: A | Discharge status: Home / Self Care | Payer: No Typology Code available for payment source | Attending: Emergency Medicine | Admitting: Emergency Medicine

## 2019-03-25 ENCOUNTER — Encounter (HOSPITAL_COMMUNITY): Payer: Self-pay | Admitting: Emergency Medicine

## 2019-03-25 ENCOUNTER — Emergency Department (HOSPITAL_COMMUNITY): Payer: No Typology Code available for payment source

## 2019-03-25 ENCOUNTER — Other Ambulatory Visit: Payer: Self-pay

## 2019-03-25 DIAGNOSIS — Z5321 Procedure and treatment not carried out due to patient leaving prior to being seen by health care provider: Secondary | ICD-10-CM | POA: Diagnosis not present

## 2019-03-25 DIAGNOSIS — R079 Chest pain, unspecified: Secondary | ICD-10-CM | POA: Insufficient documentation

## 2019-03-25 HISTORY — DX: Interstitial pulmonary disease, unspecified: J84.9

## 2019-03-25 MED ORDER — SODIUM CHLORIDE 0.9% FLUSH: 3.0000 mL | Freq: Once | INTRAVENOUS | Status: DC

## 2019-03-25 NOTE — ED Triage Notes (Signed)
Patient c/o left side chest pain that radiates to back. Per patient pain started 2 hours ago with shortness of breath, nausea, dizziness, and weakness. Patient has hx of MI in 2000. Patient took x3 Sl nitro with no relief. Patient also reports wearing a nitro patch on left arm.

## 2019-03-25 NOTE — ED Provider Notes (Signed)
Pt left without being seen after triage. I did not establish care with this patient.    Dawn Mckay 03/25/19 2152    Dawn Pence, MD 03/27/19 1537

## 2019-11-13 ENCOUNTER — Ambulatory Visit: Payer: Non-veteran care | Admitting: Gastroenterology

## 2019-12-19 ENCOUNTER — Ambulatory Visit: Payer: Non-veteran care | Admitting: Gastroenterology

## 2021-06-19 ENCOUNTER — Telehealth: Payer: Self-pay | Admitting: Gastroenterology

## 2021-06-19 NOTE — Telephone Encounter (Signed)
Good afternoon Dr. Ardis Hughs, patient called wanting to schedule an appointment.  She was a previous patient of yours but started going to [facility].  States she is unhappy with care there and is wanting to transfer back to [facility].  Records are in Epic.  Can you please review and advise on scheduling?  Thank you.

## 2021-06-22 NOTE — Telephone Encounter (Signed)
Spoke with patient and informed Dr. Ardis Hughs unable to accept pt's transfer back to [place].

## 2022-09-28 ENCOUNTER — Telehealth: Payer: Self-pay | Admitting: Internal Medicine

## 2022-09-28 NOTE — Telephone Encounter (Signed)
Hi Dr. Lorenso Courier,   [place] provider: 09/28/22  We received a referral for patient to be evaluated for hx of polyps, barretts esophagus,chronic constipation, family hx of colon cancer. She does have GI history with [facility] in [place] and the [place]. Obtained all records for you to review and advise on scheduling.  Thanks

## 2022-10-12 NOTE — Telephone Encounter (Signed)
Good Afternoon Dr.Dorsey   Patient called to follow up on scheduling..       Thanks

## 2023-03-31 ENCOUNTER — Emergency Department (HOSPITAL_COMMUNITY)
Admission: EM | Admit: 2023-03-31 | Discharge: 2023-04-01 | Disposition: A | Discharge status: Home / Self Care | Payer: No Typology Code available for payment source

## 2023-03-31 ENCOUNTER — Emergency Department (HOSPITAL_COMMUNITY): Payer: No Typology Code available for payment source

## 2023-03-31 ENCOUNTER — Other Ambulatory Visit: Payer: Self-pay

## 2023-03-31 ENCOUNTER — Encounter (HOSPITAL_COMMUNITY): Payer: Self-pay

## 2023-03-31 DIAGNOSIS — Z7982 Long term (current) use of aspirin: Secondary | ICD-10-CM | POA: Diagnosis not present

## 2023-03-31 DIAGNOSIS — R079 Chest pain, unspecified: Secondary | ICD-10-CM | POA: Insufficient documentation

## 2023-03-31 LAB — BASIC METABOLIC PANEL
Anion gap: 10 (ref 5–15)
BUN: 6 mg/dL — ABNORMAL LOW (ref 8–23)
CO2: 23 mmol/L (ref 22–32)
Calcium: 9.4 mg/dL (ref 8.9–10.3)
Chloride: 102 mmol/L (ref 98–111)
Creatinine, Ser: 0.71 mg/dL (ref 0.44–1.00)
GFR, Estimated: >60 mL/min (ref >60)
Glucose, Bld: 117 mg/dL — ABNORMAL HIGH (ref 70–99)
Potassium: 3.5 mmol/L (ref 3.5–5.1)
Sodium: 135 mmol/L (ref 135–145)

## 2023-03-31 LAB — CBC
HCT: 39.9 % (ref 36.0–46.0)
Hemoglobin: 13.4 g/dL (ref 12.0–15.0)
MCH: 31.1 pg (ref 26.0–34.0)
MCHC: 33.6 g/dL (ref 30.0–36.0)
MCV: 92.6 fL (ref 80.0–100.0)
Platelets: 258 10*3/uL (ref 150–400)
RBC: 4.31 MIL/uL (ref 3.87–5.11)
RDW: 13.5 % (ref 11.5–15.5)
WBC: 10.3 10*3/uL (ref 4.0–10.5)
nRBC: 0 % (ref 0.0–0.2)

## 2023-03-31 LAB — TROPONIN I (HIGH SENSITIVITY): Troponin I (High Sensitivity): 4 ng/L (ref <18)

## 2023-03-31 MED ORDER — KETOROLAC TROMETHAMINE 15 MG/ML IJ SOLN
15.0000 mg | Freq: Once | INTRAMUSCULAR | Status: AC
  Administered 2023-03-31: 15 mg via INTRAVENOUS
  Filled 2023-03-31: qty 1

## 2023-03-31 MED ORDER — IOHEXOL 350 MG/ML SOLN
75.0000 mL | Freq: Once | INTRAVENOUS | Status: AC | PRN
  Administered 2023-03-31: 75 mL via INTRAVENOUS

## 2023-03-31 MED ORDER — OXYCODONE-ACETAMINOPHEN 5-325 MG PO TABS
1.0000 | ORAL_TABLET | Freq: Once | ORAL | Status: AC
  Administered 2023-03-31: 1 via ORAL
  Filled 2023-03-31: qty 1

## 2023-03-31 NOTE — ED Notes (Signed)
Patient transported to CT 

## 2023-03-31 NOTE — ED Notes (Signed)
Portable Xray at bedside.

## 2023-03-31 NOTE — Discharge Instructions (Signed)
Please follow-up with your primary doctor soon as possible.  Return immediately if develop fevers, chills, worsening chest pain, shortness of breath, lightheadedness, passout or develop any new or worsening symptoms that are concerning to you.

## 2023-03-31 NOTE — ED Provider Notes (Signed)
[place] EMERGENCY DEPARTMENT AT [facility] Provider Note   CSN: [id number] Arrival date & time: 03/31/23  2126     History  Chief Complaint  Patient presents with   Chest Pain    Dawn Mckay is a 72 y.o. female.  71 year old female presenting emergency department for left-sided chest pain.  She reports intermittent sharp chest pain has been worsening over the past several months.  She had a fall Monday.  Complains of worsening pain to the left side.  Reports that pain particularly worsened this morning when she woke up and has been ongoing throughout the day.  It is left sided underneath her breast.  Sharp.  Reproducible with palpation.   Chest Pain      Home Medications Prior to Admission medications   Medication Sig Start Date End Date Taking? Authorizing Provider  albuterol (PROVENTIL HFA;VENTOLIN HFA) 108 (90 Base) MCG/ACT inhaler Inhale 2 puffs into the lungs every 6 (six) hours as needed for wheezing or shortness of breath. 02/22/16   Erick Blinks, MD  amLODipine (NORVASC) 5 MG tablet Take 5-10 mg by mouth daily. *Takes 5mg  to 10mg  based on BP levels. If levels are low in the morning, patient does not take and depending on levels, patient takes 5mg  in the morning and is able to take up to 10mg  daily if needed    [provider]  aspirin EC 325 MG tablet Take 325 mg by mouth every morning.     [provider]  budesonide-formoterol (SYMBICORT) 160-4.5 MCG/ACT inhaler Inhale 2 puffs into the lungs 2 (two) times daily.     [provider]  Cholecalciferol (VITAMIN D-3) 1000 units CAPS Take 1,000 Units by mouth at bedtime.     [provider]  Cyanocobalamin (VITAMIN B-12 PO) Take 2 tablets by mouth at bedtime.     [provider]  Dentifrices (BIOTENE DRY MOUTH) GEL Place 1 application onto teeth daily as needed (for burning mouth syndrome).     [provider]  HYDROcodone-acetaminophen (NORCO/VICODIN)  5-325 MG tablet Take 1 tablet by mouth every 6 (six) hours as needed for moderate pain.  04/26/18   [provider]  lidocaine (LIDODERM) 5 % Place 1-3 patches onto the skin daily as needed (for pain (Back)).     [provider]  linaclotide (LINZESS) 290 MCG CAPS capsule Take 290 mcg by mouth at bedtime.     [provider]  Melatonin 3 MG CAPS Take 3-6 mg by mouth at bedtime as needed (for sleep).     [provider]  Menthol-Methyl Salicylate (CALYPXO) 3-10 % CREA Apply 1 application topically 4 (four) times daily as needed (for pain).     [provider]  methocarbamol (ROBAXIN) 500 MG tablet Take 1,000 mg by mouth daily as needed for muscle spasms.     [provider]  nitroGLYCERIN (NITROSTAT) 0.4 MG SL tablet Place 0.4 mg under the tongue every 5 (five) minutes as needed for chest pain.    [provider]  omeprazole (PRILOSEC) 40 MG capsule Take 1 capsule (40 mg total) by mouth 2 (two) times daily before a meal. Patient taking differently: Take 40 mg by mouth 2 (two) times daily as needed (heartburn).  03/23/18   Rachael Fee, MD  potassium chloride (K-DUR,KLOR-CON) 10 MEQ tablet Take 10 mEq by mouth daily.    [provider]  pravastatin (PRAVACHOL) 40 MG tablet Take 40 mg by mouth every evening.    [provider]  predniSONE (DELTASONE) 10 MG tablet Take 2 tablets (20 mg total) by mouth daily. 06/01/18   Wynetta Fines, MD  Probiotic Product (PROBIOTIC PO) Take 1 capsule by mouth daily.     [provider]  senna (SENOKOT) 8.6 MG tablet Take 2-4 tablets by mouth daily as needed for constipation. Will take 3 days following Linzess dose if Linzess is ineffective    [provider]      Allergies    Amitriptyline, Neurontin [gabapentin], Lyrica [pregabalin], and Tramadol    Review of Systems   Review of Systems  Cardiovascular:  Positive for chest pain.    Physical Exam Updated Vital  Signs BP (!) 170/74 (BP Location: Left Arm)   Pulse 91   Temp 98.7 F (37.1 C) (Oral)   Resp 12   Ht 5\' 2"  (1.575 m)   Wt 82.9 kg   SpO2 92%   BMI 33.43 kg/m  Physical Exam Vitals and nursing note reviewed.  Constitutional:      General: She is not in acute distress.    Appearance: She is not toxic-appearing.  HENT:     Head: Normocephalic.  Cardiovascular:     Rate and Rhythm: Normal rate and regular rhythm.     Heart sounds: Normal heart sounds.  Pulmonary:     Breath sounds: Normal breath sounds. No wheezing, rhonchi or rales.  Chest:     Chest wall: Tenderness present.  Musculoskeletal:     Right lower leg: No edema.     Left lower leg: No edema.  Skin:    General: Skin is warm and dry.     Capillary Refill: Capillary refill takes less than 2 seconds.  Neurological:     General: No focal deficit present.     Mental Status: She is alert.     ED Results / Procedures / Treatments   Labs (all labs ordered are listed, but only abnormal results are displayed) Labs Reviewed  BASIC METABOLIC PANEL - Abnormal; Notable for the following components:      Result Value   Glucose, Bld 117 (*)    BUN 6 (*)    All other components within normal limits  CBC  TROPONIN I (HIGH SENSITIVITY)    EKG None  Radiology CT Angio Chest PE W and/or Wo Contrast  Result Date: 03/31/2023 CLINICAL DATA:  High probability for PE, chest pain. EXAM: CT ANGIOGRAPHY CHEST WITH CONTRAST TECHNIQUE: Multidetector CT imaging of the chest was performed using the standard protocol during bolus administration of intravenous contrast. Multiplanar CT image reconstructions and MIPs were obtained to evaluate the vascular anatomy. RADIATION DOSE REDUCTION: This exam was performed according to the departmental dose-optimization program which includes automated exposure control, adjustment of the mA and/or kV according to patient size and/or use of iterative reconstruction technique. CONTRAST:  75mL OMNIPAQUE  IOHEXOL 350 MG/ML SOLN COMPARISON:  CT PE protocol 10/02/2017 FINDINGS: Cardiovascular: Satisfactory opacification of the pulmonary arteries to the segmental level. No evidence of pulmonary embolism. Normal heart size. No pericardial effusion. There are atherosclerotic calcifications of the aorta. Mediastinum/Nodes: No enlarged mediastinal, hilar, or axillary lymph nodes. Thyroid gland, trachea, and esophagus demonstrate no significant findings. There is a small hiatal hernia. Lungs/Pleura: There is a small amount of airspace disease in the medial right upper lobe and lingula. There are atelectatic changes in both lower lobes there some mucous plugging in the right lower lobe peripherally. No pleural effusion or pneumothorax. Upper Abdomen: There is a 3.9  x 5.6 cm cyst in the left kidney. Musculoskeletal: No acute findings. Review of the MIP images confirms the above findings. IMPRESSION: 1. No evidence for pulmonary embolism. 2. Small amount of airspace disease in the medial right upper lobe and lingula, likely infectious/inflammatory. 3. Bibasilar atelectasis. 4. Mucous plugging in the right lower lobe. 5. Left Bosniak I benign renal cyst measuring 5.6 cm. No follow-up imaging is recommended. JACR 2018 Feb; 264-273, Management of the Incidental Renal Mass on CT, [employer] 2021; 814-848, Bosniak Classification of Cystic Renal Masses, Version 2019. Aortic Atherosclerosis (ICD10-I70.0). Electronically Signed   By: Darliss Cheney M.D.   On: 03/31/2023 23:07   DG Chest Portable 1 View  Result Date: 03/31/2023 CLINICAL DATA:  Chest pain EXAM: PORTABLE CHEST 1 VIEW COMPARISON:  05/06/2018 FINDINGS: Heart and mediastinal contours are within normal limits. No focal opacities or effusions. No acute bony abnormality. Aortic atherosclerosis. IMPRESSION: No active cardiopulmonary disease. Electronically Signed   By: Charlett Nose M.D.   On: 03/31/2023 22:09    Procedures Procedures    Medications Ordered in  ED Medications  ketorolac (TORADOL) 15 MG/ML injection 15 mg (has no administration in time range)  oxyCODONE-acetaminophen (PERCOCET/ROXICET) 5-325 MG per tablet 1 tablet (has no administration in time range)  iohexol (OMNIPAQUE) 350 MG/ML injection 75 mL (75 mLs Intravenous Contrast Given 03/31/23 2253)    ED Course/ Medical Decision Making/ A&P Clinical Course as of 03/31/23 2328  Thu Mar 31, 2023  2310 CT Angio Chest PE W and/or Wo Contrast IMPRESSION: 1. No evidence for pulmonary embolism. 2. Small amount of airspace disease in the medial right upper lobe and lingula, likely infectious/inflammatory. 3. Bibasilar atelectasis. 4. Mucous plugging in the right lower lobe. 5. Left Bosniak I benign renal cyst measuring 5.6 cm. No follow-up imaging is recommended.   [TY]    Clinical Course User Index [TY] Coral Spikes, DO                                 Medical Decision Making 72 year old female present emergency department for left-sided chest pain.  She is afebrile nontachycardic maintaining oxygen saturation on room air.  Hypertensive 170/74 initially, however improved to 118/ 68 without intervention.  Her troponin is 4.  No need to repeat as symptoms been ongoing since this morning.  Your EKG without ST segment changes to indicate ischemia.  No arrhythmia.  She has no leukocytosis to suggest systemic infection.  Her basic metabolic panel without significant abnormality.  Patient is high risk with reported history of blood clots with pleuritic type chest pain and also had recent fall. therefore CTA, CTA ordered to evaluate for trauma and for PE.  Negative for both.  Discussed findings with patient.  Pain treated with Toradol and Percocet.  Stable for discharge at this time.  Amount and/or Complexity of Data Reviewed Labs: ordered. Radiology: ordered. Decision-making details documented in ED Course.  Risk Prescription drug management.          Final Clinical  Impression(s) / ED Diagnoses Final diagnoses:  Chest pain, unspecified type    Rx / DC Orders ED Discharge Orders     None         Coral Spikes, DO 03/31/23 2328

## 2023-03-31 NOTE — ED Triage Notes (Signed)
Patient from home for chest pain that started today. Patient reports L side chest pain that radiates to her back, describes it as stabbing. EMS reports patient took 3 nitroglycerin and 324 mg ASA at home prior to their arrival. EMS reports patient had a fall on Monday off of her porch and was not evaluated at that time. Patient denies cough, fevers, shortness of breath, N/V. Upon arrival to ER, patient is alert and oriented, ambu with cane. Airway patent and intact, in no apparent distress

## 2024-08-19 ENCOUNTER — Emergency Department (HOSPITAL_COMMUNITY)

## 2024-08-19 ENCOUNTER — Emergency Department (HOSPITAL_COMMUNITY)
Admission: EM | Admit: 2024-08-19 | Discharge: 2024-08-20 | Discharge status: Against Medical Advice | Attending: Emergency Medicine | Admitting: Emergency Medicine

## 2024-08-19 ENCOUNTER — Other Ambulatory Visit: Payer: Self-pay

## 2024-08-19 DIAGNOSIS — R600 Localized edema: Secondary | ICD-10-CM | POA: Diagnosis not present

## 2024-08-19 DIAGNOSIS — I2510 Atherosclerotic heart disease of native coronary artery without angina pectoris: Secondary | ICD-10-CM | POA: Diagnosis not present

## 2024-08-19 DIAGNOSIS — R079 Chest pain, unspecified: Secondary | ICD-10-CM | POA: Diagnosis present

## 2024-08-19 DIAGNOSIS — Z8673 Personal history of transient ischemic attack (TIA), and cerebral infarction without residual deficits: Secondary | ICD-10-CM | POA: Insufficient documentation

## 2024-08-19 DIAGNOSIS — Z5329 Procedure and treatment not carried out because of patient's decision for other reasons: Secondary | ICD-10-CM | POA: Diagnosis not present

## 2024-08-19 DIAGNOSIS — Z7982 Long term (current) use of aspirin: Secondary | ICD-10-CM | POA: Diagnosis not present

## 2024-08-19 DIAGNOSIS — Z79899 Other long term (current) drug therapy: Secondary | ICD-10-CM | POA: Insufficient documentation

## 2024-08-19 DIAGNOSIS — I10 Essential (primary) hypertension: Secondary | ICD-10-CM | POA: Diagnosis not present

## 2024-08-19 DIAGNOSIS — R911 Solitary pulmonary nodule: Secondary | ICD-10-CM | POA: Insufficient documentation

## 2024-08-19 DIAGNOSIS — J449 Chronic obstructive pulmonary disease, unspecified: Secondary | ICD-10-CM | POA: Insufficient documentation

## 2024-08-19 DIAGNOSIS — Z7951 Long term (current) use of inhaled steroids: Secondary | ICD-10-CM | POA: Insufficient documentation

## 2024-08-19 LAB — CBC
HCT: 41.8 % (ref 36.0–46.0)
Hemoglobin: 13.8 g/dL (ref 12.0–15.0)
MCH: 30.5 pg (ref 26.0–34.0)
MCHC: 33 g/dL (ref 30.0–36.0)
MCV: 92.3 fL (ref 80.0–100.0)
Platelets: 258 K/uL (ref 150–400)
RBC: 4.53 MIL/uL (ref 3.87–5.11)
RDW: 12.9 % (ref 11.5–15.5)
WBC: 8.4 K/uL (ref 4.0–10.5)
nRBC: 0 % (ref 0.0–0.2)

## 2024-08-19 LAB — BASIC METABOLIC PANEL WITH GFR
Anion gap: 12 (ref 5–15)
BUN: 8 mg/dL (ref 8–23)
CO2: 23 mmol/L (ref 22–32)
Calcium: 9.3 mg/dL (ref 8.9–10.3)
Chloride: 100 mmol/L (ref 98–111)
Creatinine, Ser: 1.11 mg/dL — ABNORMAL HIGH (ref 0.44–1.00)
GFR, Estimated: 53 mL/min — ABNORMAL LOW
Glucose, Bld: 105 mg/dL — ABNORMAL HIGH (ref 70–99)
Potassium: 3.9 mmol/L (ref 3.5–5.1)
Sodium: 135 mmol/L (ref 135–145)

## 2024-08-19 LAB — TROPONIN T, HIGH SENSITIVITY: Troponin T High Sensitivity: <15 ng/L (ref 0–19)

## 2024-08-19 LAB — PROTIME-INR
INR: 1 (ref 0.8–1.2)
Prothrombin Time: 13.4 s (ref 11.4–15.2)

## 2024-08-19 LAB — PRO BRAIN NATRIURETIC PEPTIDE: Pro Brain Natriuretic Peptide: 212 pg/mL

## 2024-08-19 NOTE — ED Triage Notes (Signed)
 Patient reports pain  across chest with SOB , productive cough and nausea onset today . No diaphoresis . Denies fever or chills .

## 2024-08-20 ENCOUNTER — Emergency Department (HOSPITAL_COMMUNITY)

## 2024-08-20 LAB — TROPONIN T, HIGH SENSITIVITY: Troponin T High Sensitivity: <15 ng/L (ref 0–19)

## 2024-08-20 LAB — RESP PANEL BY RT-PCR (RSV, FLU A&B, COVID)  RVPGX2
Influenza A by PCR: NEGATIVE
Influenza B by PCR: NEGATIVE
Resp Syncytial Virus by PCR: NEGATIVE
SARS Coronavirus 2 by RT PCR: NEGATIVE

## 2024-08-20 MED ORDER — IPRATROPIUM-ALBUTEROL 0.5-2.5 (3) MG/3ML IN SOLN
3.0000 mL | Freq: Once | RESPIRATORY_TRACT | Status: DC
  Filled 2024-08-20: qty 3

## 2024-08-20 MED ORDER — IOHEXOL 350 MG/ML SOLN
75.0000 mL | Freq: Once | INTRAVENOUS | Status: AC | PRN
  Administered 2024-08-20: 75 mL via INTRAVENOUS

## 2024-08-20 MED ORDER — FENTANYL CITRATE (PF) 50 MCG/ML IJ SOSY
50.0000 ug | PREFILLED_SYRINGE | Freq: Once | INTRAMUSCULAR | Status: AC
  Administered 2024-08-20: 50 ug via INTRAVENOUS
  Filled 2024-08-20: qty 1

## 2024-08-20 MED ORDER — METHYLPREDNISOLONE SODIUM SUCC 125 MG IJ SOLR
125.0000 mg | Freq: Once | INTRAMUSCULAR | Status: DC
  Filled 2024-08-20: qty 2

## 2024-08-20 MED ORDER — ONDANSETRON 4 MG PO TBDP: 4.0000 mg | ORAL_TABLET | Freq: Three times a day (TID) | ORAL | Status: DC | PRN

## 2024-08-20 NOTE — ED Notes (Signed)
 Pt stating that she wanted to leave AMA. EDP Lawsing made aware and spoke with the pt. IV removed. Pt ambulated out of the ED with family member.

## 2024-08-20 NOTE — ED Provider Notes (Signed)
 " [place] EMERGENCY DEPARTMENT AT [facility] Provider Note   CSN: [id number] Arrival date & time: 08/19/24  2251     Patient presents with: Chest Pain   Dawn Mckay is a 73 y.o. female.    Chest Pain Associated symptoms: cough and shortness of breath      73 year old female with medical history significant for CAD, HTN, GERD, anxiety, depression, COPD, depression, PE not on anticoagulation, TIA, hepatitis C, interstitial lung disease presenting to the emergency department with a chief complaint of chest pain and shortness of breath.  The patient states that she noted yesterday swelling in her left leg that has worsened.  She states that yesterday she had sudden onset chest pain with associated shortness of breath, states that it hurts to take every breath.  She has had a cough as well with some nausea, denies any diaphoresis.  Denies any fevers or chills.  She denies any sick contacts.  Prior to Admission medications  Medication Sig Start Date End Date Taking? Authorizing Provider  albuterol  (PROVENTIL  HFA;VENTOLIN  HFA) 108 (90 Base) MCG/ACT inhaler Inhale 2 puffs into the lungs every 6 (six) hours as needed for wheezing or shortness of breath. 02/22/16   Antoinette Doe, MD  amLODipine  (NORVASC ) 5 MG tablet Take 5-10 mg by mouth daily. *Takes 5mg  to 10mg  based on BP levels. If levels are low in the morning, patient does not take and depending on levels, patient takes 5mg  in the morning and is able to take up to 10mg  daily if needed    [provider]  aspirin  EC 325 MG tablet Take 325 mg by mouth every morning.     [provider]  budesonide-formoterol (SYMBICORT) 160-4.5 MCG/ACT inhaler Inhale 2 puffs into the lungs 2 (two) times daily.     [provider]  Cholecalciferol (VITAMIN D-3) 1000 units CAPS Take 1,000 Units by mouth at bedtime.     [provider]  Cyanocobalamin (VITAMIN B-12 PO) Take 2 tablets by mouth at bedtime.      [provider]  Dentifrices (BIOTENE DRY MOUTH) GEL Place 1 application onto teeth daily as needed (for burning mouth syndrome).     [provider]  HYDROcodone -acetaminophen  (NORCO/VICODIN) 5-325 MG tablet Take 1 tablet by mouth every 6 (six) hours as needed for moderate pain.  04/26/18   [provider]  lidocaine  (LIDODERM ) 5 % Place 1-3 patches onto the skin daily as needed (for pain (Back)).     [provider]  linaclotide  (LINZESS ) 290 MCG CAPS capsule Take 290 mcg by mouth at bedtime.     [provider]  Melatonin 3 MG CAPS Take 3-6 mg by mouth at bedtime as needed (for sleep).     [provider]  Menthol-Methyl Salicylate (CALYPXO) 3-10 % CREA Apply 1 application topically 4 (four) times daily as needed (for pain).     [provider]  methocarbamol (ROBAXIN) 500 MG tablet Take 1,000 mg by mouth daily as needed for muscle spasms.     [provider]  nitroGLYCERIN  (NITROSTAT ) 0.4 MG SL tablet Place 0.4 mg under the tongue every 5 (five) minutes as needed for chest pain.    [provider]  omeprazole  (PRILOSEC) 40 MG capsule Take 1 capsule (40 mg total) by mouth 2 (two) times daily before a meal. Patient taking differently: Take 40 mg by mouth 2 (two) times daily as needed (heartburn).  03/23/18   Teressa Toribio SQUIBB, MD  potassium chloride  (K-DUR,KLOR-CON )  10 MEQ tablet Take 10 mEq by mouth daily.    [provider]  pravastatin  (PRAVACHOL ) 40 MG tablet Take 40 mg by mouth every evening.    [provider]  predniSONE  (DELTASONE ) 10 MG tablet Take 2 tablets (20 mg total) by mouth daily. 06/01/18   Laurice Maude BROCKS, MD  Probiotic Product (PROBIOTIC PO) Take 1 capsule by mouth daily.     [provider]  senna (SENOKOT) 8.6 MG tablet Take 2-4 tablets by mouth daily as needed for constipation. Will take 3 days following Linzess  dose if Linzess  is ineffective    [provider]     Allergies: Amitriptyline, Neurontin [gabapentin], Lyrica [pregabalin], and Tramadol    Review of Systems  Respiratory:  Positive for cough and shortness of breath.   Cardiovascular:  Positive for chest pain and leg swelling.  All other systems reviewed and are negative.   Updated Vital Signs BP (!) 144/79   Pulse 80   Temp 97.8 F (36.6 C) (Oral)   Resp 20   SpO2 99%   Physical Exam Vitals and nursing note reviewed.  Constitutional:      General: She is not in acute distress.    Appearance: She is well-developed.  HENT:     Head: Normocephalic and atraumatic.  Eyes:     Conjunctiva/sclera: Conjunctivae normal.  Cardiovascular:     Rate and Rhythm: Normal rate and regular rhythm.  Pulmonary:     Effort: Pulmonary effort is normal. No respiratory distress.     Breath sounds: Normal breath sounds.  Abdominal:     Palpations: Abdomen is soft.     Tenderness: There is no abdominal tenderness.  Musculoskeletal:     Cervical back: Neck supple.     Left lower leg: Edema present.     Comments: Asymmetric left lower extremity edema, 2+ edema in the left lower extremity, trace edema in the right lower extremity.  Skin:    General: Skin is warm and dry.     Capillary Refill: Capillary refill takes less than 2 seconds.  Neurological:     Mental Status: She is alert.  Psychiatric:        Mood and Affect: Mood normal.     (all labs ordered are listed, but only abnormal results are displayed) Labs Reviewed  BASIC METABOLIC PANEL WITH GFR - Abnormal; Notable for the following components:      Result Value   Glucose, Bld 105 (*)    Creatinine, Ser 1.11 (*)    GFR, Estimated 53 (*)    All other components within normal limits  RESP PANEL BY RT-PCR (RSV, FLU A&B, COVID)  RVPGX2  CBC  PROTIME-INR  PRO BRAIN NATRIURETIC PEPTIDE  TROPONIN T, HIGH SENSITIVITY  TROPONIN T, HIGH SENSITIVITY    EKG: EKG Interpretation Date/Time:  Sunday August 19 2024 23:10:50  EST Ventricular Rate:  89 PR Interval:  160 QRS Duration:  62 QT Interval:  346 QTC Calculation: 420 R Axis:   0  Text Interpretation: Normal sinus rhythm No significant change since last tracing Confirmed by Jerrol Agent ([id number]) on 08/20/2024 2:02:55 AM  Radiology: CT Angio Chest PE W and/or Wo Contrast Result Date: 08/20/2024 EXAM: CTA CHEST 08/20/2024 03:10:38 AM TECHNIQUE: CTA of the chest was performed without and with the administration of 75 mL of iohexol  (OMNIPAQUE ) 350 MG/ML injection. Multiplanar reformatted images are provided for review. MIP images are provided for review. Automated exposure control, iterative reconstruction, and/or weight based adjustment of the  mA/kV was utilized to reduce the radiation dose to as low as reasonably achievable. COMPARISON: 03/31/2023 CLINICAL HISTORY: Pulmonary embolism (PE) suspected, high prob. Chest pain, cough. FINDINGS: PULMONARY ARTERIES: Pulmonary arteries are adequately opacified for evaluation. Central pulmonary arteries are of normal caliber. No acute pulmonary embolus. MEDIASTINUM: Moderate coronary artery calcification. Global cardiac size within normal limits. No pericardial effusion. Mild atherosclerotic calcification within the thoracic aorta. No aortic aneurysm. LYMPH NODES: No mediastinal, hilar or axillary lymphadenopathy. LUNGS AND PLEURA: 7 mm pulmonary nodule within the left upper lobe apex (series 27, image 6) is increased in both size and density when compared to prior examination and is suspicious for a developing neoplasm. This may be below the limited resolution of PET imaging and the small size and peripheral position would make tissue sampling relatively challenging. Follow up imaging in 3-6 months would be helpful to assess for interval change and confirm a potential neoplastic process. Stable band-like scarring within the right middle lobe and lingula. No focal consolidation or pulmonary edema. No evidence of pleural effusion  or pneumothorax. UPPER ABDOMEN: Small hiatal hernia. SOFT TISSUES AND BONES: Status post left thyroidectomy. No acute bone or soft tissue abnormality. IMPRESSION: 1. No pulmonary embolism. 2. Interval increase in a 7 mm left apical pulmonary nodule, suspicious for developing neoplasm; recommend non-contrast chest CT in 3-6 months, as per Fleischner Society Guidelines. 3. Moderate coronary artery calcification. 4. Status post left thyroidectomy. Electronically signed by: Dorethia Molt MD 08/20/2024 03:32 AM EST RP Workstation: [id number]   DG Chest 2 View Result Date: 08/20/2024 EXAM: 2 VIEW(S) XRAY OF THE CHEST 08/20/2024 12:06:14 AM COMPARISON: 03/31/2023 CLINICAL HISTORY: chest pain FINDINGS: LUNGS AND PLEURA: No focal pulmonary opacity. No pleural effusion. No pneumothorax. HEART AND MEDIASTINUM: Aortic atherosclerosis. BONES AND SOFT TISSUES: No acute osseous abnormality. IMPRESSION: 1. No acute cardiopulmonary findings. 2. Aortic atherosclerosis. Electronically signed by: Dorethia Molt MD 08/20/2024 12:27 AM EST RP Workstation: [id number]     Procedures   Medications Ordered in the ED  ondansetron  (ZOFRAN -ODT) disintegrating tablet 4 mg (has no administration in time range)  ipratropium-albuterol  (DUONEB) 0.5-2.5 (3) MG/3ML nebulizer solution 3 mL (has no administration in time range)  methylPREDNISolone  sodium succinate (SOLU-MEDROL ) 125 mg/2 mL injection 125 mg (has no administration in time range)  iohexol  (OMNIPAQUE ) 350 MG/ML injection 75 mL (75 mLs Intravenous Contrast Given 08/20/24 0311)  fentaNYL  (SUBLIMAZE ) injection 50 mcg (50 mcg Intravenous Given 08/20/24 0334)                                    Medical Decision Making Amount and/or Complexity of Data Reviewed Labs: ordered. Radiology: ordered.  Risk Prescription drug management.    73 year old female with medical history significant for CAD, HTN, GERD, anxiety, depression, COPD, depression, PE not on anticoagulation,  TIA, hepatitis C, interstitial lung disease presenting to the emergency department with a chief complaint of chest pain and shortness of breath.  The patient states that she noted yesterday swelling in her left leg that has worsened.  She states that yesterday she had sudden onset chest pain with associated shortness of breath, states that it hurts to take every breath.  She has had a cough as well with some nausea, denies any diaphoresis.  Denies any fevers or chills.  She denies any sick contacts.  On arrival, the patient was afebrile, temperature 97.5, nontachycardic heart rate 93, tachypneic RR 21, BP 175/70, saturating 95% on  room air.  On exam the patient had asymmetric left lower extremity swelling, 2+ edema in the left lower extremity, trace edema in the right lower extremity.  My primary concern is for DVT/PE, consider viral infection, pneumothorax, pneumonia, less likely ACS, aortic dissection, other acute intrathoracic abnormality.  EKG: Normal sinus rhythm, ventricular rate 89, no acute ischemic changes noted.  Chest x-ray: No acute cardiopulmonary findings, aortic atherosclerosis noted.  Labs: CBC without a leukocytosis or anemia, BMP unremarkable, INR normal, BNP normal at 212, cardiac troponins x 2 negative.  COVID flu and RSV PCR testing was collected and resulted negative.  DVT US  LLE ordered and pending.  CTA PE: IMPRESSION:  1. No pulmonary embolism.  2. Interval increase in a 7 mm left apical pulmonary nodule, suspicious for  developing neoplasm; recommend non-contrast chest CT in 3-6 months, as per  Fleischner Society Guidelines.  3. Moderate coronary artery calcification.  4. Status post left thyroidectomy.   Pt informed of the findings associated with her CT imaging.  On repeat auscultation, the patient had wheezing on posterior auscultation bilaterally, considered COPD as a component/etiology of the patient's presentation.  Patient was offered DuoNeb as well as IV  Solu-Medrol .  Her ultrasound of the lower extremity was pending when I was informed that the patient wished to leave the emergency department AGAINST MEDICAL ADVICE.  She has not completed treatment and her diagnostic workup is at this time and complete.  The patient was informed of the risk of missed diagnosis to include significant morbidity and mortality and despite this the patient still requested to leave AMA.  Patient was advised close follow-up with pulmonology regarding the findings on her CT.      Final diagnoses:  Chest pain, unspecified type  Pulmonary nodule  Chronic obstructive pulmonary disease, unspecified COPD type [facility])    ED Discharge Orders          Ordered    Ambulatory referral to Pulmonology        08/20/24 0356               Jerrol Agent, MD 08/20/24 0407  "

## 2024-08-20 NOTE — Discharge Instructions (Addendum)
 Your CT scan showed the following: IMPRESSION:  1. No pulmonary embolism.  2. Interval increase in a 7 mm left apical pulmonary nodule, suspicious for  developing neoplasm; recommend non-contrast chest CT in 3-6 months, as per  Fleischner Society Guidelines.  3. Moderate coronary artery calcification.  4. Status post left thyroidectomy.   It was recommended that you stay in the emergency department for ultrasound to further evaluate for a blood clot in your legs given the unilateral swelling.  You have elected to leave the emergency department AGAINST MEDICAL ADVICE.  He was additionally advised that you received treatment for your COPD in the emergency department which you have also declined.  You understand the risks of leaving at this time which include missed diagnosis of a potential blood clot in your leg which could then go to your lungs causing cardiac arrest.  Regarding your lung nodule, please follow-up outpatient with pulmonology as this could be a malignant nodule that requires close outpatient follow-up. I have placed an outpatient order for a LE venous ultrasound to be performed.

## 2024-09-03 ENCOUNTER — Ambulatory Visit: Admitting: Cardiology

## 2024-09-03 NOTE — Progress Notes (Unsigned)
" °  Cardiology Office Note   Date:  09/03/2024  ID:  Dawn Mckay, DOB 10-Jul-1951, MRN [id number] PCP: [facility] Providers Cardiologist:  None { Click to update primary MD,subspecialty MD or APP then REFRESH:1}    History of Present Illness Dawn Mckay is a 74 y.o. female ***  ROS: ***  Studies Reviewed      *** Risk Assessment/Calculations {Does this patient have ATRIAL FIBRILLATION?[phone]} No BP recorded.  {Refresh Note OR Click here to enter BP  :1}***       Physical Exam VS:  There were no vitals taken for this visit.       Wt Readings from Last 3 Encounters:  03/31/23 182 lb 12.8 oz (82.9 kg)  03/25/19 150 lb (68 kg)  06/01/18 165 lb (74.8 kg)    GEN: Well nourished, well developed in no acute distress NECK: No JVD; No carotid bruits CARDIAC: ***RRR, no murmurs, rubs, gallops RESPIRATORY:  Clear to auscultation without rales, wheezing or rhonchi  ABDOMEN: Soft, non-tender, non-distended EXTREMITIES:  No edema; No deformity   ASSESSMENT AND PLAN ***    {Are you ordering a CV Procedure (e.g. stress test, cath, DCCV, TEE, etc)?   Press F2        :[id number]}  Dispo: ***  Signed, Rollo FABIENE Louder, PA-C   "

## 2024-09-18 ENCOUNTER — Encounter: Payer: Self-pay | Admitting: Cardiology

## 2024-09-18 NOTE — Progress Notes (Unsigned)
 "    Cardiology Office Note   Date:  09/18/2024   ID:  Dawn Mckay, DOB Jan 09, 1951, MRN [id number]  PCP:  [facility]  Cardiologist:   None Referring:  ***  No chief complaint on file.     History of Present Illness: Dawn Mckay is a 74 y.o. female who presents for evaluation of dizziness, HTN and chest pain.  ***      Echo in Jan 2025 demonstrated NL LV function and no significant valvular abnormalities.   She had been seen in another health system.   ***    Past Medical History:  Diagnosis Date   Anxiety    Arthritis    CAD (coronary artery disease)    Cervical cancer (HCC)    Chronic headache    Colon polyps    COPD (chronic obstructive pulmonary disease) (HCC)    Depression    Fibromyalgia    GERD (gastroesophageal reflux disease)    Hepatitis C    History of breast cancer    HLD (hyperlipidemia)    Hypertension    IBS (irritable bowel syndrome)    Interstitial lung disease (HCC)    Pneumonia    Pulmonary embolism (HCC)    TIA (transient ischemic attack)     Past Surgical History:  Procedure Laterality Date   BIOPSY  03/23/2018   Procedure: BIOPSY;  Surgeon: Teressa Toribio SQUIBB, MD;  Location: [facility];  Service: Endoscopy;;   BRAIN SURGERY     BREAST SURGERY     due to Cancer   CATARACT EXTRACTION W/ INTRAOCULAR LENS IMPLANT Bilateral    CESAREAN SECTION     COLONOSCOPY WITH PROPOFOL  N/A 03/23/2018   Procedure: COLONOSCOPY WITH PROPOFOL ;  Surgeon: Teressa Toribio SQUIBB, MD;  Location: [facility];  Service: Endoscopy;  Laterality: N/A;   ESOPHAGOGASTRODUODENOSCOPY (EGD) WITH PROPOFOL  N/A 03/23/2018   Procedure: ESOPHAGOGASTRODUODENOSCOPY (EGD) WITH PROPOFOL ;  Surgeon: Teressa Toribio SQUIBB, MD;  Location: [facility];  Service: Endoscopy;  Laterality: N/A;   PARTIAL HYSTERECTOMY     POLYPECTOMY  03/23/2018   Procedure: POLYPECTOMY;  Surgeon: Teressa Toribio SQUIBB, MD;  Location: [facility];  Service: Endoscopy;;   THROAT SURGERY     TOTAL ABDOMINAL  HYSTERECTOMY       Current Outpatient Medications  Medication Sig Dispense Refill   albuterol  (PROVENTIL  HFA;VENTOLIN  HFA) 108 (90 Base) MCG/ACT inhaler Inhale 2 puffs into the lungs every 6 (six) hours as needed for wheezing or shortness of breath. 1 Inhaler 2   amLODipine  (NORVASC ) 5 MG tablet Take 5-10 mg by mouth daily. *Takes 5mg  to 10mg  based on BP levels. If levels are low in the morning, patient does not take and depending on levels, patient takes 5mg  in the morning and is able to take up to 10mg  daily if needed     aspirin  EC 325 MG tablet Take 325 mg by mouth every morning.      budesonide-formoterol (SYMBICORT) 160-4.5 MCG/ACT inhaler Inhale 2 puffs into the lungs 2 (two) times daily.      Cholecalciferol (VITAMIN D-3) 1000 units CAPS Take 1,000 Units by mouth at bedtime.      Cyanocobalamin (VITAMIN B-12 PO) Take 2 tablets by mouth at bedtime.      Dentifrices (BIOTENE DRY MOUTH) GEL Place 1 application onto teeth daily as needed (for burning mouth syndrome).      HYDROcodone -acetaminophen  (NORCO/VICODIN) 5-325 MG tablet Take 1 tablet by mouth every 6 (six) hours as needed for moderate pain.  lidocaine  (LIDODERM ) 5 % Place 1-3 patches onto the skin daily as needed (for pain (Back)).      linaclotide  (LINZESS ) 290 MCG CAPS capsule Take 290 mcg by mouth at bedtime.      Melatonin 3 MG CAPS Take 3-6 mg by mouth at bedtime as needed (for sleep).      Menthol-Methyl Salicylate (CALYPXO) 3-10 % CREA Apply 1 application topically 4 (four) times daily as needed (for pain).      methocarbamol (ROBAXIN) 500 MG tablet Take 1,000 mg by mouth daily as needed for muscle spasms.      nitroGLYCERIN  (NITROSTAT ) 0.4 MG SL tablet Place 0.4 mg under the tongue every 5 (five) minutes as needed for chest pain.     omeprazole  (PRILOSEC) 40 MG capsule Take 1 capsule (40 mg total) by mouth 2 (two) times daily before a meal. (Patient taking differently: Take 40 mg by mouth 2 (two) times daily as needed  (heartburn). ) 60 capsule 11   potassium chloride  (K-DUR,KLOR-CON ) 10 MEQ tablet Take 10 mEq by mouth daily.     pravastatin  (PRAVACHOL ) 40 MG tablet Take 40 mg by mouth every evening.     predniSONE  (DELTASONE ) 10 MG tablet Take 2 tablets (20 mg total) by mouth daily. 10 tablet 0   Probiotic Product (PROBIOTIC PO) Take 1 capsule by mouth daily.      senna (SENOKOT) 8.6 MG tablet Take 2-4 tablets by mouth daily as needed for constipation. Will take 3 days following Linzess  dose if Linzess  is ineffective     No current facility-administered medications for this visit.    Allergies:   Amitriptyline, Neurontin [gabapentin], Lyrica [pregabalin], and Tramadol    Social History:  The patient  reports that she has been smoking cigarettes. She has never used smokeless tobacco. She reports that she does not drink alcohol and does not use drugs.   Family History:  The patient's ***family history includes Colon cancer in her brother, brother, and father; Heart disease in her father and maternal grandmother; Kidney cancer in her mother.    ROS:  Please see the history of present illness.   Otherwise, review of systems are positive for {NONE DEFAULTED:18576}.   All other systems are reviewed and negative.    PHYSICAL EXAM: VS:  There were no vitals taken for this visit. , BMI There is no height or weight on file to calculate BMI. GENERAL:  Well appearing HEENT:  Pupils equal round and reactive, fundi not visualized, oral mucosa unremarkable NECK:  No jugular venous distention, waveform within normal limits, carotid upstroke brisk and symmetric, no bruits, no thyromegaly LYMPHATICS:  No cervical, inguinal adenopathy LUNGS:  Clear to auscultation bilaterally BACK:  No CVA tenderness CHEST:  Unremarkable HEART:  PMI not displaced or sustained,S1 and S2 within normal limits, no S3, no S4, no clicks, no rubs, *** murmurs ABD:  Flat, positive bowel sounds normal in frequency in pitch, no bruits, no  rebound, no guarding, no midline pulsatile mass, no hepatomegaly, no splenomegaly EXT:  2 plus pulses throughout, no edema, no cyanosis no clubbing SKIN:  No rashes no nodules NEURO:  Cranial nerves II through XII grossly intact, motor grossly intact throughout PSYCH:  Cognitively intact, oriented to person place and time    EKG:        Recent Labs: 08/19/2024: BUN 8; Creatinine, Ser 1.11; Hemoglobin 13.8; Platelets 258; Potassium 3.9; Pro Brain Natriuretic Peptide 212.0; Sodium 135    Lipid Panel    Component Value Date/Time  CHOL 182 06/21/2014 1942   TRIG 94 06/21/2014 1942   HDL 70 06/21/2014 1942   CHOLHDL 2.6 06/21/2014 1942   VLDL 19 06/21/2014 1942   LDLCALC 93 06/21/2014 1942      Wt Readings from Last 3 Encounters:  03/31/23 182 lb 12.8 oz (82.9 kg)  03/25/19 150 lb (68 kg)  06/01/18 165 lb (74.8 kg)      Other studies Reviewed: Additional studies/ records that were reviewed today include: ***. Review of the above records demonstrates:  Please see elsewhere in the note.  ***   ASSESSMENT AND PLAN:  *** Precordial chest pain  Palpitations:  ***   Dizziness:  ***   Current medicines are reviewed at length with the patient today.  The patient {ACTIONS; HAS/DOES NOT HAVE:19233} concerns regarding medicines.  The following changes have been made:  {PLAN; NO CHANGE:13088:s}  Labs/ tests ordered today include: *** No orders of the defined types were placed in this encounter.    Disposition:   FU with ***    Signed, Lynwood Schilling, MD  09/18/2024 9:15 PM    [facility]    "

## 2024-09-20 ENCOUNTER — Ambulatory Visit: Admitting: Cardiology

## 2024-09-20 DIAGNOSIS — R072 Precordial pain: Secondary | ICD-10-CM

## 2024-09-20 DIAGNOSIS — R42 Dizziness and giddiness: Secondary | ICD-10-CM

## 2024-09-20 DIAGNOSIS — R002 Palpitations: Secondary | ICD-10-CM

## 2024-09-27 ENCOUNTER — Encounter: Payer: Self-pay | Admitting: Gastroenterology

## 2024-10-02 ENCOUNTER — Ambulatory Visit: Admitting: Cardiology

## 2024-11-02 ENCOUNTER — Ambulatory Visit: Admitting: Gastroenterology
